# Patient Record
Sex: Male | Born: 1970 | Race: White | Hispanic: No | Marital: Single | Smoking: Never smoker
Health system: Southern US, Community
[De-identification: ages and names within clinical notes are randomized; demographics above are authoritative.]

## PROBLEM LIST (undated history)

## (undated) DIAGNOSIS — I4891 Unspecified atrial fibrillation: Secondary | ICD-10-CM

## (undated) DIAGNOSIS — K649 Unspecified hemorrhoids: Secondary | ICD-10-CM

## (undated) DIAGNOSIS — I872 Venous insufficiency (chronic) (peripheral): Secondary | ICD-10-CM

## (undated) DIAGNOSIS — K645 Perianal venous thrombosis: Secondary | ICD-10-CM

## (undated) DIAGNOSIS — I341 Nonrheumatic mitral (valve) prolapse: Secondary | ICD-10-CM

## (undated) DIAGNOSIS — F419 Anxiety disorder, unspecified: Secondary | ICD-10-CM

## (undated) HISTORY — PX: COLONOSCOPY: SHX174

## (undated) HISTORY — DX: Unspecified hemorrhoids: K64.9

## (undated) HISTORY — PX: OTHER SURGICAL HISTORY: SHX169

## (undated) HISTORY — DX: Nonrheumatic mitral (valve) prolapse: I34.1

## (undated) HISTORY — DX: Perianal venous thrombosis: K64.5

## (undated) HISTORY — DX: Venous insufficiency (chronic) (peripheral): I87.2

## (undated) HISTORY — DX: Anxiety disorder, unspecified: F41.9

## (undated) HISTORY — PX: MOUTH SURGERY: SHX715

---

## 2004-07-14 DIAGNOSIS — K649 Unspecified hemorrhoids: Secondary | ICD-10-CM

## 2004-07-14 HISTORY — DX: Unspecified hemorrhoids: K64.9

## 2005-02-25 ENCOUNTER — Ambulatory Visit: Payer: Self-pay | Admitting: Internal Medicine

## 2005-03-20 ENCOUNTER — Ambulatory Visit: Payer: Self-pay | Admitting: Internal Medicine

## 2005-03-26 ENCOUNTER — Ambulatory Visit: Payer: Self-pay | Admitting: Internal Medicine

## 2005-04-24 ENCOUNTER — Ambulatory Visit: Payer: Self-pay | Admitting: Internal Medicine

## 2007-07-27 ENCOUNTER — Encounter: Admission: RE | Admit: 2007-07-27 | Discharge: 2007-08-05 | Payer: Self-pay | Admitting: Family Medicine

## 2010-02-12 ENCOUNTER — Telehealth: Payer: Self-pay | Admitting: Internal Medicine

## 2010-02-15 ENCOUNTER — Encounter (INDEPENDENT_AMBULATORY_CARE_PROVIDER_SITE_OTHER): Payer: Self-pay | Admitting: *Deleted

## 2010-08-13 NOTE — Progress Notes (Signed)
Summary: ? next colonosopy   Phone Note From Other Clinic   Caller: [name], MD Summary of Call: asking when patient is due for routine colonoscopy (fam hx rectal cancer) sounds like 9/11 will follow-up and advise once chart reviewed Initial call taken by: Iva Boop MD, Clementeen Graham,  February 12, 2010 3:47 PM  Follow-up for Phone Call        recall in for Sept, chart is in your office on top of your recall stack.  Please advise Follow-up by: Darcey Nora RN, CGRN,  February 12, 2010 3:57 PM  Additional Follow-up for Phone Call Additional follow up Details #1::        It is time to repeat. You can call him to set up. chart on your desk Additional Follow-up by: Iva Boop MD, Clementeen Graham,  February 13, 2010 6:02 PM    Additional Follow-up for Phone Call Additional follow up Details #2::    Left message for patient to call back Darcey Nora RN, [facility]  February 14, 2010 8:20 AM  Left message for patient to call back Darcey Nora RN, [facility]  February 15, 2010 9:03 AM  No return call from the patient and I will be out of the office next week.  Recall colon letter will be sent to his home. Darcey Nora RN, [facility]  February 15, 2010 11:16 AM

## 2010-08-13 NOTE — Letter (Signed)
Summary: Colonoscopy Letter  [facility]  [address] [place], [place] [address]   Phone: [phone]  Fax: [phone]      February 15, 2010 MRN: [id number]   Charles Rasmussen [address] [place], [place]  [address]   Dear Charles Rasmussen,   According to your medical record, it is time for you to schedule a Colonoscopy. The American Cancer Society recommends this procedure as a method to detect early colon cancer. Patients with a family history of colon cancer, or a personal history of colon polyps or inflammatory bowel disease are at increased risk.  This letter has beeen generated based on the recommendations made at the time of your procedure. If you feel that in your particular situation this may no longer apply, please contact our office.  Please call our office at [phone] to schedule this appointment or to update your records at your earliest convenience.  Thank you for cooperating with [place] to provide you with the very best care possible.   Sincerely,   Iva Boop, M.D.  [facility] [phone]

## 2011-12-16 ENCOUNTER — Encounter: Payer: Self-pay | Admitting: Vascular Surgery

## 2011-12-29 ENCOUNTER — Encounter: Payer: Self-pay | Admitting: Vascular Surgery

## 2011-12-30 ENCOUNTER — Ambulatory Visit (INDEPENDENT_AMBULATORY_CARE_PROVIDER_SITE_OTHER): Payer: BC Managed Care – PPO | Admitting: *Deleted

## 2011-12-30 ENCOUNTER — Ambulatory Visit (INDEPENDENT_AMBULATORY_CARE_PROVIDER_SITE_OTHER): Payer: BC Managed Care – PPO | Admitting: Vascular Surgery

## 2011-12-30 ENCOUNTER — Encounter: Payer: Self-pay | Admitting: Vascular Surgery

## 2011-12-30 VITALS — BP 106/70 | HR 77 | Resp 16 | Ht 76.0 in | Wt 175.0 lb

## 2011-12-30 DIAGNOSIS — I83893 Varicose veins of bilateral lower extremities with other complications: Secondary | ICD-10-CM

## 2011-12-30 NOTE — Progress Notes (Signed)
Subjective:     Patient ID: Charles Rasmussen, male   DOB: 10-18-1970, 41 y.o.   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 He states this has been present for about 10 years and gradually enlarging. He has no history of DVT, thrombophlebitis, stasis ulcers, distal edema, or bleeding. He does remember bumping his leg on something in this area about 5-6 years ago. He was seen at a local vein Center who recommended closing his saphenous vein with a laser. He felt uncomfortable and came for another opinion.  Past Medical History  Diagnosis Date  . Mitral valve prolapse     History  Substance Use Topics  . Smoking status: Never Smoker   . Smokeless tobacco: Not on file  . Alcohol Use: No    History reviewed. No pertinent family history.  No Known Allergies  No current outpatient prescriptions on file.  BP 106/70  Pulse 77  Resp 16  Ht 6\' 4"  (1.93 m)  Wt 175 lb (79.379 kg)  BMI 21.30 kg/m2  Body mass index is 21.30 kg/(m^2).           Review of Systems denies chest pain, dyspnea on exertion, PND, orthopnea. Does have occasional palpitations due to mitral valve reflux all other systems negative and a complete review of systems     Objective:   Physical Exam blood pressure 106/70 heart rate 77 respirations 16 Gen.-alert and oriented x3 in no apparent distress HEENT normal for age Lungs no rhonchi or wheezing Cardiovascular regular rhythm no murmurs carotid pulses 3+ palpable no bruits audible Abdomen soft nontender no palpable masses Musculoskeletal free of  major deformities Skin clear -no rashes Neurologic normal Lower extremities 3+ femoral and dorsalis pedis pulses palpable bilaterally with no edema-there is a prominent nest of reticular veins in the right pretibial region which seemed to communicate with the great saphenous vein just below the knee. There are no other bulging varicosities.  There is no hyperpigmentation or ulceration. There is no distal edema.  Today I ordered a venous duplex exam of the right leg which are reviewed and interpreted. The right great saphenous vein is not of large caliber. Does have a segment in the distal thigh which has reflux. There is no DVT.      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 He will consider this

## 2012-01-02 DIAGNOSIS — I872 Venous insufficiency (chronic) (peripheral): Secondary | ICD-10-CM

## 2012-01-02 HISTORY — DX: Venous insufficiency (chronic) (peripheral): I87.2

## 2012-01-06 NOTE — Procedures (Unsigned)
LOWER EXTREMITY VENOUS REFLUX EXAM  INDICATION:  Varicose veins with pain.  EXAM:  Using color-flow imaging and pulse Doppler spectral analysis, the right common femoral, femoral, popliteal, posterior tibial, great and small saphenous veins were evaluated.  There is evidence suggesting deep venous reflux of >500 milliseconds noted in the right common femoral and superficial femoral veins.  The right saphenofemoral junction is competent.  The right nontortuous great saphenous vein demonstrates reflux of >500 milliseconds with calibers as described below.   The right proximal small saphenous vein demonstrates competency.  GSV Diameter (used if found to be incompetent only)                                           Right    Left Proximal Greater Saphenous Vein           0.35 cm  cm Proximal-to-mid-thigh                     cm       cm Mid thigh                                 0.36 cm  cm Mid-distal thigh                          cm       cm Distal thigh                              0.48 cm  cm Knee                                      0.45 cm  cm  IMPRESSION: 1. Right great saphenous vein reflux as noted, as described above. 2. Right deep venous reflux as noted, as described above.  ___________________________________________ [name]. Hart Rochester, M.D.  CH/MEDQ  D:  01/02/2012  T:  01/02/2012  Job:  [id number]

## 2012-04-09 ENCOUNTER — Encounter: Payer: Self-pay | Admitting: Internal Medicine

## 2012-09-14 ENCOUNTER — Encounter: Payer: Self-pay | Admitting: *Deleted

## 2012-09-15 ENCOUNTER — Ambulatory Visit (INDEPENDENT_AMBULATORY_CARE_PROVIDER_SITE_OTHER): Payer: BC Managed Care – PPO | Admitting: *Deleted

## 2012-09-15 ENCOUNTER — Encounter: Payer: Self-pay | Admitting: Vascular Surgery

## 2012-09-15 DIAGNOSIS — I781 Nevus, non-neoplastic: Secondary | ICD-10-CM

## 2012-09-15 DIAGNOSIS — I83893 Varicose veins of bilateral lower extremities with other complications: Secondary | ICD-10-CM

## 2012-09-15 NOTE — Progress Notes (Signed)
X=...4% Sotradecol administered with a 27g butterfly.  Patient received a total of 6cc.  Bundle of VV's on top of right chin. Easy access. Tol well. Anticipate good results. Has reflux in R GSV-discussed this. Diameter too small for laser ablation at this time. Follow prn.   Photos: yes  Compression stockings applied: yes and ace

## 2012-09-27 ENCOUNTER — Other Ambulatory Visit: Payer: Self-pay | Admitting: Family Medicine

## 2012-09-28 LAB — URINE CULTURE: Organism ID, Bacteria: NO GROWTH

## 2012-10-27 ENCOUNTER — Encounter: Payer: Self-pay | Admitting: *Deleted

## 2012-10-28 ENCOUNTER — Encounter: Payer: Self-pay | Admitting: Family Medicine

## 2012-10-28 ENCOUNTER — Ambulatory Visit (INDEPENDENT_AMBULATORY_CARE_PROVIDER_SITE_OTHER): Payer: BC Managed Care – PPO | Admitting: Family Medicine

## 2012-10-28 VITALS — BP 105/68 | HR 65 | Temp 97.5°F | Ht 76.0 in | Wt 188.0 lb

## 2012-10-28 DIAGNOSIS — N419 Inflammatory disease of prostate, unspecified: Secondary | ICD-10-CM

## 2012-10-28 DIAGNOSIS — N390 Urinary tract infection, site not specified: Secondary | ICD-10-CM

## 2012-10-28 DIAGNOSIS — I341 Nonrheumatic mitral (valve) prolapse: Secondary | ICD-10-CM

## 2012-10-28 DIAGNOSIS — I059 Rheumatic mitral valve disease, unspecified: Secondary | ICD-10-CM

## 2012-10-28 DIAGNOSIS — E785 Hyperlipidemia, unspecified: Secondary | ICD-10-CM | POA: Insufficient documentation

## 2012-10-28 DIAGNOSIS — N411 Chronic prostatitis: Secondary | ICD-10-CM | POA: Insufficient documentation

## 2012-10-28 DIAGNOSIS — L919 Hypertrophic disorder of the skin, unspecified: Secondary | ICD-10-CM

## 2012-10-28 DIAGNOSIS — L918 Other hypertrophic disorders of the skin: Secondary | ICD-10-CM

## 2012-10-28 LAB — POCT URINALYSIS DIPSTICK
Bilirubin, UA: NEGATIVE
Blood, UA: NEGATIVE
Glucose, UA: NEGATIVE
Ketones, UA: NEGATIVE
Leukocytes, UA: NEGATIVE
Nitrite, UA: NEGATIVE
pH, UA: 6.5

## 2012-10-28 LAB — POCT UA - MICROSCOPIC ONLY
Bacteria, U Microscopic: NEGATIVE
Mucus, UA: NEGATIVE
RBC, urine, microscopic: NEGATIVE

## 2012-10-28 LAB — POCT WET PREP (WET MOUNT)

## 2012-10-28 NOTE — Progress Notes (Signed)
  Subjective:    Patient ID: Charles Rasmussen, male    DOB: [birth year], 42 y.o.   MRN: [id number]  HPI Nazim returns today for followup of his chronic prostatitis. Just finished course of doxycycline for 4 weeks. In addition he has an irritated skin tag of the left medial buttock that he wants addressed today.  Review of Systems  Musculoskeletal: Positive for back pain (continued LBP).       Objective:   Physical Exam  Vitals reviewed. Constitutional: He is oriented to person, place, and time. He appears well-developed and well-nourished.  Abdominal: Soft. Bowel sounds are normal. He exhibits no distension and no mass. Tenderness: slight suprapubic. There is no rebound and no guarding.  Genitourinary: Penis normal. No penile tenderness.  Prostate slightly enlarged without lumps but tender to palpation. Rectal masses. No hernia palpated. No discharge. Irritated skin tag left medial buttock  Neurological: He is alert and oriented to person, place, and time. He has normal reflexes.  Skin: Skin is warm and dry. No rash noted. No erythema. No pallor.  Left medial buttock skin tag, irritated  Psychiatric: He has a normal mood and affect. His behavior is normal. Judgment and thought content normal.      Results for orders placed in visit on 10/28/12  POCT URINALYSIS DIPSTICK      Result Value Range   Color, UA yellow     Clarity, UA clear     Glucose, UA negative     Bilirubin, UA negative     Ketones, UA negative     Spec Grav, UA 1.015     Blood, UA negative     pH, UA 6.5     Protein, UA negative     Urobilinogen, UA negative     Nitrite, UA negative     Leukocytes, UA Negative    POCT UA - MICROSCOPIC ONLY      Result Value Range   WBC, Ur, HPF, POC neg     RBC, urine, microscopic neg     Bacteria, U Microscopic neg     Mucus, UA neg     Epithelial cells, urine per micros neg     Crystals, Ur, HPF, POC neg     Casts, Ur, LPF, POC neg     Yeast, UA neg    POCT WET PREP (WET  MOUNT)      Result Value Range   Source Wet Prep POC prostate     WBC, Wet Prep HPF POC 5-10     Bacteria Wet Prep HPF POC occ      Assessment & Plan:  1. UTI (urinary tract infection) - POCT urinalysis dipstick - POCT UA - Microscopic Only  2. Chronic prostatitis  3. Hyperlipidemia   4. MVP (mitral valve prolapse) Appointment to see Dr. Shirlee Latch  5. Prostate infection - POCT Wet Prep [facility]) Continue antibiotic for 2 more weeks then as needed  6. Skin tag Cryotherapy to skin tag  Per.mission by patient to do cryotherapy on this.  Was done successfully without problems

## 2012-10-28 NOTE — Patient Instructions (Signed)
Push fluids Try some over-the-counter anti-inflammatory medicine i.e. Aleve one twice daily after breakfast and supper for right side and low back pain Take antibiotic for a couple of more weeks, then hold the rest

## 2012-11-09 ENCOUNTER — Institutional Professional Consult (permissible substitution): Payer: BC Managed Care – PPO | Admitting: Cardiology

## 2012-12-20 ENCOUNTER — Telehealth: Payer: Self-pay | Admitting: Internal Medicine

## 2012-12-20 NOTE — Telephone Encounter (Signed)
Patient c/o external hemorrhoids.  He will come in and see Willette Cluster RNP tomorrow at 9:30 am

## 2012-12-21 ENCOUNTER — Encounter: Payer: Self-pay | Admitting: *Deleted

## 2012-12-21 ENCOUNTER — Encounter (INDEPENDENT_AMBULATORY_CARE_PROVIDER_SITE_OTHER): Payer: Self-pay | Admitting: Surgery

## 2012-12-21 ENCOUNTER — Ambulatory Visit (INDEPENDENT_AMBULATORY_CARE_PROVIDER_SITE_OTHER): Payer: BC Managed Care – PPO | Admitting: Nurse Practitioner

## 2012-12-21 ENCOUNTER — Ambulatory Visit (INDEPENDENT_AMBULATORY_CARE_PROVIDER_SITE_OTHER): Payer: BC Managed Care – PPO | Admitting: Surgery

## 2012-12-21 VITALS — BP 110/60 | HR 60 | Ht 76.0 in | Wt 183.8 lb

## 2012-12-21 VITALS — BP 118/82 | HR 76 | Temp 97.3°F | Resp 14 | Ht 76.0 in | Wt 184.4 lb

## 2012-12-21 DIAGNOSIS — K645 Perianal venous thrombosis: Secondary | ICD-10-CM

## 2012-12-21 MED ORDER — HYDROCODONE-ACETAMINOPHEN 5-325 MG PO TABS: 1.0000 | ORAL_TABLET | ORAL | Status: DC | PRN

## 2012-12-21 NOTE — Patient Instructions (Addendum)
Go to [facility] [address] [place]. Phone # [phone]. You will see Dr. Cyndia Bent. Arrive at 3:15 for a 3:30 PM .    We have given you samples of Recticare cream, apply to affected area.

## 2012-12-21 NOTE — Progress Notes (Signed)
HPI :   Patient is a 42 year old male known to Dr. Leone Payor. He had a colonoscopy September 2006 for abdominal pain, bloating, rectal bleeding and a family history of colon cancer in a parent. Findings included internal and external hemorrhoids, felt to be the source of bleeding. No polyps or cancer seen.  Fish or done for evaluation of hemorrhoids. History slightly working out at the [employer], that evening noticed a large hemorrhoid. Patient tried to reduce the hemorrhoid but was unable to. He spent in a lot of discomfort over the last couple of days. Hemorrhoid now with some mild bleeding. Patient has not been constipated.   Past Medical History  Diagnosis Date  . Mitral valve prolapse   . Venous reflux 01/02/12    deep right per [name]. Hart Rochester, M.D.  . Unspecified hemorrhoids without mention of complication 2006    Family History  Problem Relation Age of Onset  . Colon cancer Father 64  . Colon cancer Maternal Grandfather 24   History  Substance Use Topics  . Smoking status: Never Smoker   . Smokeless tobacco: Never Used  . Alcohol Use: No   Current Outpatient Prescriptions  Medication Sig Dispense Refill  . doxycycline (VIBRA-TABS) 100 MG tablet        No current facility-administered medications for this visit.   No Known Allergies  Review of Systems: All systems reviewed and negative except where noted in HPI.   Physical Exam: BP 110/60  Pulse 60  Ht 6\' 4"  (1.93 m)  Wt 183 lb 12.8 oz (83.371 kg)  BMI 22.38 kg/m2 Constitutional: Peasant,well-developed, white male in no acute distress. HEENT: Normocephalic and atraumatic. Conjunctivae are normal. No scleral icterus. Heterochromia Abdominal: Soft, nondistended, nontender. Bowel sounds active throughout. There are no masses palpable. No hepatomegaly. Rectal: Large tender thrombosed external hemorrhoid Extremities: no edema Psychiatric: Normal mood and affect. Behavior is normal.  ASSESSMENT AND PLAN: 42. 42 year old male  with very large to painful, thrombosed external hemorrhoid. Not sure this will resolve with conservative management. Patient having significant discomfort. Samples of Lidocaine ( Recticare ) given. Referred to [facility] today at 3pm.    2. Chronic prostate infection, takes Doxycycline. Advised patient to take with full glass of fluid and not lay down for at least 2 hours after taking medication as Doxycycline can cause esophageal ulcers.    Addendum: Reviewed and agree with initial management. Beverley Fiedler, MD

## 2012-12-21 NOTE — Patient Instructions (Signed)
Hemorrhoids Hemorrhoids are swollen veins around the rectum or anus. There are two types of hemorrhoids:   Internal hemorrhoids. These occur in the veins just inside the rectum. They may poke through to the outside and become irritated and painful.  External hemorrhoids. These occur in the veins outside the anus and can be felt as a painful swelling or hard lump near the anus. CAUSES  Pregnancy.   Obesity.   Constipation or diarrhea.   Straining to have a bowel movement.   Sitting for long periods on the toilet.  Heavy lifting or other activity that caused you to strain.  Anal intercourse. SYMPTOMS   Pain.   Anal itching or irritation.   Rectal bleeding.   Fecal leakage.   Anal swelling.   One or more lumps around the anus.  DIAGNOSIS  Your caregiver may be able to diagnose hemorrhoids by visual examination. Other examinations or tests that may be performed include:   Examination of the rectal area with a gloved hand (digital rectal exam).   Examination of anal canal using a small tube (scope).   A blood test if you have lost a significant amount of blood.  A test to look inside the colon (sigmoidoscopy or colonoscopy). TREATMENT Most hemorrhoids can be treated at home. However, if symptoms do not seem to be getting better or if you have a lot of rectal bleeding, your caregiver may perform a procedure to help make the hemorrhoids get smaller or remove them completely. Possible treatments include:   Placing a rubber band at the base of the hemorrhoid to cut off the circulation (rubber band ligation).   Injecting a chemical to shrink the hemorrhoid (sclerotherapy).   Using a tool to burn the hemorrhoid (infrared light therapy).   Surgically removing the hemorrhoid (hemorrhoidectomy).   Stapling the hemorrhoid to block blood flow to the tissue (hemorrhoid stapling).  HOME CARE INSTRUCTIONS   Eat foods with fiber, such as whole grains, beans,  nuts, fruits, and vegetables. Ask your doctor about taking products with added fiber in them (fibersupplements).  Increase fluid intake. Drink enough water and fluids to keep your urine clear or pale yellow.   Exercise regularly.   Go to the bathroom when you have the urge to have a bowel movement. Do not wait.   Avoid straining to have bowel movements.   Keep the anal area dry and clean. Use wet toilet paper or moist towelettes after a bowel movement.   Medicated creams and suppositories may be used or applied as directed.   Only take over-the-counter or prescription medicines as directed by your caregiver.   Take warm sitz baths for 15 20 minutes, 3 4 times a day to ease pain and discomfort.   Place ice packs on the hemorrhoids if they are tender and swollen. Using ice packs between sitz baths may be helpful.   Put ice in a plastic bag.   Place a towel between your skin and the bag.   Leave the ice on for 15 20 minutes, 3 4 times a day.   Do not use a donut-shaped pillow or sit on the toilet for long periods. This increases blood pooling and pain.  SEEK MEDICAL CARE IF:  You have increasing pain and swelling that is not controlled by treatment or medicine.  You have uncontrolled bleeding.  You have difficulty or you are unable to have a bowel movement.  You have pain or inflammation outside the area of the hemorrhoids. MAKE SURE YOU:    Understand these instructions.  Will watch your condition.  Will get help right away if you are not doing well or get worse. Document Released: 06/27/2000 Document Revised: 06/16/2012 Document Reviewed: 05/04/2012 Exit[employer] Patient Information 2014 [employer].  

## 2012-12-21 NOTE — Progress Notes (Signed)
NAME: Charles Rasmussen       DOB: 07/19/1970           DATE: 12/21/2012       ZOX:[id number]  CC:  Chief Complaint  Patient presents with  . Hemorrhoids    throm hems    HPI: This patient is referred over for recent development of thrombosed external hemorrhoids. He was seen in the gastroenterology office earlier today. He developed painful hemorrhoids about 5 days ago and it got even worse over the last 2 days. Gastroenterologist felt that surgical drainage might be more appropriate than just local medical care. He is not had hemorrhoids previously. He thinks he came after doing some exercises.  EXAM: Vital signs: BP 118/82  Pulse 76  Temp(Src) 97.3 F (36.3 C) (Temporal)  Resp 14  Ht 6\' 4"  (1.93 m)  Wt 184 lb 6.4 oz (83.643 kg)  BMI 22.46 kg/m2  General: Patient alert, oriented, NAD  Rectal exam: He has thrombosed and somewhat prolapsed right external hemorrhoid that shows a little clot extruding. It involves about 120 of the circumference of the anus. There is a very small thrombosed hemorrhoid in the left anterior aspect. These are exquisitely tender. There is no evidence of gangrenous changes. IMP: Thrombosed external hemorrhoids  PLAN: I discussed putting in some local and trying to extrude some clot there is some immediate pain relief. We also talked about local therapy and followup. He may need to the operative this improve over the next 48-72 hour  PROCEDURE NOTE I discussed doing an I&D of the thrombosed hemorrhoid to the patient he was agreeable. Area was anesthetized with a combination of 1% Xylocaine plus epinephrine and 0.25% plain Marcaine with epinephrine. A total of 10 cc was utilized. I waited 10 minutes For good hemostasis effect.   I then made some elliptical incisions over the most prominent areas of clot and removed clot from all the areas such that most of the swelling was gone and no obvious residual clot was remaining. A Vaseline gauze dressing was applied.  The  procedure was less than tolerated. He'll begin sitz baths in the morning. I may give him some Norco for pain. He'll followup with [place] in about 2 weeks, sooner if he has any worsening of his symptoms or other problems. Cloey Sferrazza J 12/21/2012

## 2012-12-27 ENCOUNTER — Telehealth: Payer: Self-pay | Admitting: Nurse Practitioner

## 2012-12-27 NOTE — Telephone Encounter (Signed)
Spoke with patient and instructed him to call Dr. Jamey Ripa to let him know he is still having problems since he did surgery on him. Patient will do this.

## 2012-12-28 ENCOUNTER — Ambulatory Visit (INDEPENDENT_AMBULATORY_CARE_PROVIDER_SITE_OTHER): Payer: BC Managed Care – PPO | Admitting: General Surgery

## 2012-12-28 ENCOUNTER — Encounter (INDEPENDENT_AMBULATORY_CARE_PROVIDER_SITE_OTHER): Payer: Self-pay | Admitting: General Surgery

## 2012-12-28 VITALS — BP 110/72 | HR 80 | Temp 97.8°F | Resp 15 | Ht 76.0 in | Wt 183.6 lb

## 2012-12-28 DIAGNOSIS — K645 Perianal venous thrombosis: Secondary | ICD-10-CM

## 2012-12-28 MED ORDER — HYDROCORTISONE 2.5 % RE CREA: TOPICAL_CREAM | Freq: Two times a day (BID) | RECTAL | Status: DC

## 2012-12-28 NOTE — Patient Instructions (Addendum)

## 2012-12-28 NOTE — Progress Notes (Signed)
Chief Complaint  Patient presents with  . Routine Post Op    1st po reck hems    HISTORY: Charles Rasmussen is a 42 y.o. male who presents to the office with rectal pain.  Other symptoms include min bleeding on toilet paper.  This had been occurring for about a week.  He denies bleeding with BM's otherwise.  He has tried sitz in the past with some success.  Nothing makes the symptoms worse.   It is continuous in nature.  His bowel habits are regular and his bowel movements are soft.  His fiber intake is moderate.       Past Medical History  Diagnosis Date  . Mitral valve prolapse   . Venous reflux 01/02/12    deep right per [name]. Hart Rochester, M.D.  . Unspecified hemorrhoids without mention of complication 2006      Past Surgical History  Procedure Laterality Date  . Neg hx    . Mouth surgery      wisdom teeth        Current Outpatient Prescriptions  Medication Sig Dispense Refill  . doxycycline (VIBRA-TABS) 100 MG tablet        No current facility-administered medications for this visit.      No Known Allergies    Family History  Problem Relation Age of Onset  . Colon cancer Father 71  . Cancer Father     rectal  . Colon cancer Maternal Grandfather 40  . Cancer Paternal Grandfather     rectal    History   Social History  . Marital Status: Single    Spouse Name: N/A    Number of Children: 0  . Years of Education: N/A   Occupational History  .     Social History Main Topics  . Smoking status: Never Smoker   . Smokeless tobacco: Never Used  . Alcohol Use: No  . Drug Use: No  . Sexually Active: None   Other Topics Concern  . None   Social History Narrative  . None      REVIEW OF SYSTEMS - PERTINENT POSITIVES ONLY: Review of Systems - General ROS: negative for - chills, fever or weight loss Hematological and Lymphatic ROS: negative for - bleeding problems, blood clots or bruising Respiratory ROS: no cough, shortness of breath, or wheezing Cardiovascular ROS:  no chest pain or dyspnea on exertion Gastrointestinal ROS: no abdominal pain, change in bowel habits, or black or bloody stools Genito-Urinary ROS: no dysuria, trouble voiding, or hematuria  EXAM: Filed Vitals:   12/28/12 1022  BP: 110/72  Pulse: 80  Temp: 97.8 F (36.6 C)  Resp: 15    General appearance: alert and cooperative Resp: clear to auscultation bilaterally Cardio: regular rate and rhythm GI: soft, non-tender; bowel sounds normal; no masses,  no organomegaly   Anal exam findings: inflamed external hemorrhoid, no palpable thrombosis    ASSESSMENT AND PLAN: Charles Rasmussen is a 42 y.o. M here for f/u regarding his thrombosed external hemorrhoid.  He seems to be recovering slowly.  I have advised him to continue sitz baths and eat a high fiber diet as well as avoid straining for the next few weeks.  I have given him a prescription for anusol.  I will see him back if his symptoms persist.      [name], MD Colon and Rectal Surgery / [facility]      Visit Diagnoses: 1. External hemorrhoid, thrombosed  Primary Care Physician: [name], MD

## 2013-01-07 ENCOUNTER — Telehealth (INDEPENDENT_AMBULATORY_CARE_PROVIDER_SITE_OTHER): Payer: Self-pay

## 2013-01-07 NOTE — Telephone Encounter (Signed)
I left the patient a message to call me.  He has an appointment Tuesday to see Dr Maisie Fus and I want to see if he plans to keep it.

## 2013-01-07 NOTE — Telephone Encounter (Signed)
Patient hems is doing better cancelled appt with Dr. Maisie Fus for 01/11/13

## 2013-01-11 ENCOUNTER — Encounter (INDEPENDENT_AMBULATORY_CARE_PROVIDER_SITE_OTHER): Payer: BC Managed Care – PPO | Admitting: General Surgery

## 2013-02-09 ENCOUNTER — Institutional Professional Consult (permissible substitution): Payer: BC Managed Care – PPO | Admitting: Cardiology

## 2013-11-02 ENCOUNTER — Other Ambulatory Visit: Payer: BC Managed Care – PPO

## 2013-11-02 NOTE — Progress Notes (Signed)
Patient dropped off fobt 

## 2013-11-03 LAB — FECAL OCCULT BLOOD, IMMUNOCHEMICAL: FECAL OCCULT BLOOD: NEGATIVE

## 2013-11-07 ENCOUNTER — Encounter: Payer: Self-pay | Admitting: Family Medicine

## 2013-11-07 ENCOUNTER — Ambulatory Visit (INDEPENDENT_AMBULATORY_CARE_PROVIDER_SITE_OTHER): Payer: BC Managed Care – PPO | Admitting: Family Medicine

## 2013-11-07 ENCOUNTER — Ambulatory Visit (INDEPENDENT_AMBULATORY_CARE_PROVIDER_SITE_OTHER): Payer: BC Managed Care – PPO

## 2013-11-07 VITALS — BP 117/63 | HR 66 | Temp 97.5°F | Ht 76.0 in | Wt 184.0 lb

## 2013-11-07 DIAGNOSIS — N411 Chronic prostatitis: Secondary | ICD-10-CM

## 2013-11-07 DIAGNOSIS — Z0000 Encounter for general adult medical examination without abnormal findings: Secondary | ICD-10-CM

## 2013-11-07 DIAGNOSIS — E785 Hyperlipidemia, unspecified: Secondary | ICD-10-CM

## 2013-11-07 DIAGNOSIS — N400 Enlarged prostate without lower urinary tract symptoms: Secondary | ICD-10-CM

## 2013-11-07 DIAGNOSIS — L918 Other hypertrophic disorders of the skin: Secondary | ICD-10-CM

## 2013-11-07 DIAGNOSIS — J309 Allergic rhinitis, unspecified: Secondary | ICD-10-CM

## 2013-11-07 DIAGNOSIS — I341 Nonrheumatic mitral (valve) prolapse: Secondary | ICD-10-CM

## 2013-11-07 DIAGNOSIS — Q676 Pectus excavatum: Secondary | ICD-10-CM | POA: Insufficient documentation

## 2013-11-07 LAB — POCT URINALYSIS DIPSTICK
BILIRUBIN UA: NEGATIVE
Blood, UA: NEGATIVE
GLUCOSE UA: NEGATIVE
Ketones, UA: NEGATIVE
Leukocytes, UA: NEGATIVE
Nitrite, UA: NEGATIVE
Protein, UA: NEGATIVE
Spec Grav, UA: 1.015
Urobilinogen, UA: NEGATIVE
pH, UA: 6

## 2013-11-07 LAB — POCT UA - MICROSCOPIC ONLY
BACTERIA, U MICROSCOPIC: NEGATIVE
Casts, Ur, LPF, POC: NEGATIVE
Crystals, Ur, HPF, POC: NEGATIVE
Epithelial cells, urine per micros: NEGATIVE
Mucus, UA: NEGATIVE
RBC, URINE, MICROSCOPIC: NEGATIVE
WBC, Ur, HPF, POC: NEGATIVE
Yeast, UA: NEGATIVE

## 2013-11-07 MED ORDER — DOXYCYCLINE HYCLATE 100 MG PO TABS: 100.0000 mg | ORAL_TABLET | Freq: Two times a day (BID) | ORAL | Status: DC

## 2013-11-07 NOTE — Progress Notes (Addendum)
Subjective:    Patient ID: Charles Rasmussen, male    DOB: 1971/06/16, 43 y.o.   MRN: [id number]  HPI Patient is here today for annual wellness exam and follow up of chronic medical problems. The patient is doing well with minimal complaints. He still has his ongoing issues with his chronic prostatitis. He has to take periodic rounds of antibiotic for this. He also has a history of mitral valve prolapse and palpitations. He currently has some left flank pressure. He had lab work done at his work and these will be reviewed with him today.          Patient Active Problem List   Diagnosis Date Noted  . Thrombosed external hemorrhoid 12/21/2012  . Chronic prostatitis 10/28/2012  . Hyperlipidemia 10/28/2012  . MVP (mitral valve prolapse) 10/28/2012  . Varicose veins of lower extremities with other complications 12/30/2011   Outpatient Encounter Prescriptions as of 11/07/2013  Medication Sig  . [DISCONTINUED] doxycycline (VIBRA-TABS) 100 MG tablet   . [DISCONTINUED] hydrocortisone (ANUSOL-HC) 2.5 % rectal cream Place rectally 2 (two) times daily. Apply around anus for irritated & painful hemorrhoids    Review of Systems  Constitutional: Negative.   HENT: Negative.   Eyes: Negative.   Respiratory: Negative.   Cardiovascular: Negative.   Gastrointestinal: Negative.   Endocrine: Negative.   Genitourinary: Negative.        Flank pressure  Musculoskeletal: Negative.   Skin: Negative.   Allergic/Immunologic: Negative.   Neurological: Negative.   Hematological: Negative.   Psychiatric/Behavioral: Negative.        Objective:   Physical Exam  Nursing note and vitals reviewed. Constitutional: He is oriented to person, place, and time. He appears well-developed and well-nourished. No distress.  HENT:  Head: Normocephalic and atraumatic.  Right Ear: External ear normal.  Left Ear: External ear normal.  Mouth/Throat: Oropharynx is clear and moist. No oropharyngeal exudate.  Nasal  congestion bilateral  Eyes: Conjunctivae and EOM are normal. Pupils are equal, round, and reactive to light. Right eye exhibits no discharge. Left eye exhibits no discharge. No scleral icterus.  Neck: Normal range of motion. Neck supple. No thyromegaly present.  No carotid bruits  Cardiovascular: Normal rate, regular rhythm, normal heart sounds and intact distal pulses.  Exam reveals no gallop and no friction rub.   No murmur heard. At 84 per minute. The rhythm was mostly regular with one or 2 skips auscultated. There were no murmurs  Pulmonary/Chest: Effort normal and breath sounds normal. No respiratory distress. He has no wheezes. He has no rales. He exhibits no tenderness.  Abdominal: Soft. Bowel sounds are normal. He exhibits no mass. There is no tenderness. There is no rebound and no guarding.  Genitourinary: Rectum normal and penis normal.  The prostate was slightly enlarged but smooth there were no rectal masses. There were no hernias palpated and no inguinal nodes. The external genitalia were within normal limits.  Musculoskeletal: Normal range of motion. He exhibits no edema and no tenderness.  The patient does have a pectus excavatum  Lymphadenopathy:    He has no cervical adenopathy.  Neurological: He is alert and oriented to person, place, and time. He has normal reflexes. No cranial nerve deficit.  Skin: Skin is warm and dry. No rash noted. No erythema. No pallor.  Cryotherapy was done to an irritated skin tag of the left medial buttock  Psychiatric: He has a normal mood and affect. His behavior is normal. Judgment and thought content normal.  BP 117/63  Pulse 66  Temp(Src) 97.5 F (36.4 C) (Oral)  Ht 6\' 4"  (1.93 m)  Wt 184 lb (83.462 kg)  BMI 22.41 kg/m2  [facility] reading (PRIMARY) by  Dr. Tracie HarrierMoore-chest x-ray-                                 Results for orders placed in visit on 11/07/13  POCT URINALYSIS DIPSTICK      Result Value Ref Range   Color, UA gold     Clarity, UA  clear     Glucose, UA neg     Bilirubin, UA neg     Ketones, UA neg     Spec Grav, UA 1.015     Blood, UA neg     pH, UA 6.0     Protein, UA neg     Urobilinogen, UA negative     Nitrite, UA neg     Leukocytes, UA Negative    POCT UA - MICROSCOPIC ONLY      Result Value Ref Range   WBC, Ur, HPF, POC neg     RBC, urine, microscopic neg     Bacteria, U Microscopic neg     Mucus, UA neg     Epithelial cells, urine per micros neg     Crystals, Ur, HPF, POC neg     Casts, Ur, LPF, POC neg     Yeast, UA neg     The urine wet prep after the prostate exam-5-8 RBCs and occasional WBC--no treatment necessary [facility] reading (PRIMARY) by  Dr.Frimet Durfee-chest -no active disease                                      Assessment & Plan:  1. Prostate enlargement - POCT urinalysis dipstick - POCT UA - Microscopic Only  2. Hyperlipidemia - DG Chest 2 View; Future  3. Chronic prostatitis  4. Annual physical exam - DG Chest 2 View; Future  5. Mitral valve prolapse - Ambulatory referral to Cardiology  6. Allergic rhinitis  7. Skin tag -Cryotherapy  8. Pectus excavatum  Patient Instructions  Continue aggressive therapeutic lifestyle changes which include diet and exercise Recheck advanced lipid test in 6 month Continue to drink plenty of fluid A prescription for an antibiotic has been called in for the prostatitis to have filled as needed The cryotherapy on the skin tag may form a blister and it should fall off at that time. Keep the area cleaned with alcohol   Nyra Capeson W. Noland Pizano MD

## 2013-11-07 NOTE — Patient Instructions (Signed)
Continue aggressive therapeutic lifestyle changes which include diet and exercise Recheck advanced lipid test in 6 month Continue to drink plenty of fluid A prescription for an antibiotic has been called in for the prostatitis to have filled as needed The cryotherapy on the skin tag may form a blister and it should fall off at that time. Keep the area cleaned with alcohol

## 2013-11-15 ENCOUNTER — Ambulatory Visit: Payer: BC Managed Care – PPO | Admitting: Cardiology

## 2014-02-03 ENCOUNTER — Ambulatory Visit (INDEPENDENT_AMBULATORY_CARE_PROVIDER_SITE_OTHER): Payer: BC Managed Care – PPO | Admitting: Family Medicine

## 2014-02-03 ENCOUNTER — Encounter: Payer: Self-pay | Admitting: Family Medicine

## 2014-02-03 VITALS — BP 119/71 | HR 86 | Temp 98.3°F | Ht 76.0 in | Wt 181.4 lb

## 2014-02-03 DIAGNOSIS — N476 Balanoposthitis: Secondary | ICD-10-CM

## 2014-02-03 DIAGNOSIS — N481 Balanitis: Secondary | ICD-10-CM

## 2014-02-03 MED ORDER — NYSTATIN 100000 UNIT/GM EX CREA: 1.0000 "application " | TOPICAL_CREAM | Freq: Two times a day (BID) | CUTANEOUS | Status: DC

## 2014-02-03 MED ORDER — FLUCONAZOLE 150 MG PO TABS: 150.0000 mg | ORAL_TABLET | Freq: Once | ORAL | Status: DC

## 2014-02-03 NOTE — Progress Notes (Signed)
   Subjective:    Patient ID: Merrik Vedia PereyraM Eckart, male    DOB: [birth year], 43 y.o.   MRN: [id number]  HPI This 43 y.o. male presents for evaluation of rash on private area.   Review of Systems No chest pain, SOB, HA, dizziness, vision change, N/V, diarrhea, constipation, dysuria, urinary urgency or frequency, myalgias, arthralgias or rash.     Objective:   Physical Exam  Vital signs noted  Well developed well nourished male.  HEENT - Head atraumatic Normocephalic Respiratory - Lungs CTA bilateral Cardiac - RRR S1 and S2 without murmur GU - Glans penis with erythema and mild swelling     Assessment & Plan:  Balanitis - Plan: nystatin cream (MYCOSTATIN), fluconazole (DIFLUCAN) 150 MG tablet Deatra CanterWilliam J Verble Styron FNP

## 2014-02-08 ENCOUNTER — Encounter: Payer: Self-pay | Admitting: *Deleted

## 2014-02-17 ENCOUNTER — Encounter: Payer: Self-pay | Admitting: Cardiology

## 2014-02-17 ENCOUNTER — Ambulatory Visit (INDEPENDENT_AMBULATORY_CARE_PROVIDER_SITE_OTHER): Payer: BC Managed Care – PPO | Admitting: Cardiology

## 2014-02-17 VITALS — BP 118/58 | HR 65 | Ht 76.0 in | Wt 183.0 lb

## 2014-02-17 DIAGNOSIS — I341 Nonrheumatic mitral (valve) prolapse: Secondary | ICD-10-CM

## 2014-02-17 DIAGNOSIS — I059 Rheumatic mitral valve disease, unspecified: Secondary | ICD-10-CM

## 2014-02-17 DIAGNOSIS — I493 Ventricular premature depolarization: Secondary | ICD-10-CM

## 2014-02-17 DIAGNOSIS — I4949 Other premature depolarization: Secondary | ICD-10-CM

## 2014-02-17 NOTE — Patient Instructions (Signed)
The current medical regimen is effective;  continue present plan and medications.  Your physician has requested that you have an echocardiogram. Echocardiography is a painless test that uses sound waves to create images of your heart. It provides your doctor with information about the size and shape of your heart and how well your heart's chambers and valves are working. This procedure takes approximately one hour. There are no restrictions for this procedure.  Follow up in 2 years with Dr Shirlee LatchMcLean.  You will receive a letter in the mail 2 months before you are due.  Please call us when you receive this letter to schedule your follow up appointment.

## 2014-02-19 DIAGNOSIS — I493 Ventricular premature depolarization: Secondary | ICD-10-CM | POA: Insufficient documentation

## 2014-02-19 NOTE — Progress Notes (Signed)
Patient ID: Charles Rasmussen, male   DOB: May 04, 1971, 43 y.o.   MRN: [id number] PCP: Dr. Christell ConstantMoore  43 yo with history of PVCs and possible mitral valve prolapse presents for cardiology evaluation.  Patient says that mitral valve prolapse was diagnosed in 11th grade. He also has PVCs that seem to flare when he is stressed.  No exertional dyspnea.  No chest pain.  No lightheadedness.  He walks for exercise and lifts weights. Currently having only rare palpitations.  He has not had an echocardiogram in a number of years and there is none in our system.   ECG: NSR, right axis deviation  PMH: 1. Chronic prostatitis 2. Mitral valve prolapse 3. PVCs 4. Varicose veins  SH: Lives in [place], works in school system, nonsmoker.    FH: Mother with MVP, brother with some form of congenital heart disease s/p valve surgery, grandfather with MVP.    ROS: All systems reviewed and negative except as per HPI.   No medications  BP 118/58  Pulse 65  Ht 6\' 4"  (1.93 m)  Wt 183 lb (83.008 kg)  BMI 22.28 kg/m2 General: NAD Neck: No JVD, no thyromegaly or thyroid nodule.  Lungs: Clear to auscultation bilaterally with normal respiratory effort. CV: Nondisplaced PMI.  Heart regular S1/S2, no S3/S4, no murmur.  No peripheral edema.  No carotid bruit.  Normal pedal pulses.  Abdomen: Soft, nontender, no hepatosplenomegaly, no distention.  Skin: Intact without lesions or rashes.  Neurologic: Alert and oriented x 3.  Psych: Normal affect. Extremities: No clubbing or cyanosis.  HEENT: Normal.  MSK: Pectus excavatum (mild)  Assessment/Plan: 1. PVCs: Very rare palpitations, mainly flare when he's under stress.  2. Mitral valve prolapse: No murmur on exam, no exertional symptoms.  No recent echocardiogram.  He has a mild pectus excavatum but does not have any other definite signs of a connective tissue disease.  No joint hypermobility.   - I will get an echocardiogram to assess the mitral valve.    Followup in 2 years  unless marked abnormality on echo.  Charles Rasmussen 02/19/2014

## 2014-02-27 ENCOUNTER — Ambulatory Visit (HOSPITAL_COMMUNITY): Payer: BC Managed Care – PPO | Attending: Cardiology | Admitting: Cardiology

## 2014-02-27 DIAGNOSIS — I059 Rheumatic mitral valve disease, unspecified: Secondary | ICD-10-CM | POA: Diagnosis present

## 2014-02-27 DIAGNOSIS — I493 Ventricular premature depolarization: Secondary | ICD-10-CM

## 2014-02-27 DIAGNOSIS — I341 Nonrheumatic mitral (valve) prolapse: Secondary | ICD-10-CM

## 2014-02-27 NOTE — Progress Notes (Signed)
Echo performed. 

## 2014-05-15 ENCOUNTER — Encounter: Payer: Self-pay | Admitting: Gastroenterology

## 2014-05-15 ENCOUNTER — Telehealth: Payer: Self-pay | Admitting: Internal Medicine

## 2014-05-15 ENCOUNTER — Ambulatory Visit (INDEPENDENT_AMBULATORY_CARE_PROVIDER_SITE_OTHER): Payer: BC Managed Care – PPO | Admitting: Gastroenterology

## 2014-05-15 VITALS — BP 120/72 | HR 88 | Ht 76.0 in | Wt 189.0 lb

## 2014-05-15 DIAGNOSIS — K645 Perianal venous thrombosis: Secondary | ICD-10-CM

## 2014-05-15 MED ORDER — HYDROCORTISONE 2.5 % RE CREA: 1.0000 "application " | TOPICAL_CREAM | Freq: Three times a day (TID) | RECTAL | Status: DC

## 2014-05-15 NOTE — Telephone Encounter (Signed)
Patient with a history of thrombosed hemorrhoid.  He has a large painful hemorrhoid that "came up a few days ago".  He will come in and see Doug SouJessica Zehr, PA today at 2:30

## 2014-05-15 NOTE — Progress Notes (Signed)
     05/15/2014 Kaeson Vedia PereyraM Gerstel [id number] May 22, 1971   History of Present Illness:  This is a 43 year old male who is previously known to Dr. Leone PayorGessner. He had a colonoscopy September 2006 for abdominal pain, bloating, rectal bleeding and a family history of colon cancer in his father.  Findings included internal and external hemorrhoids, felt to be the source of bleeding.  No polyps or cancer seen.  Never had a repeat colonoscopy since that time and he is aware that he needs another one; said that he will plan to schedule it soon.  He was seen in 12/2012 for thrombosed hemorrhoid and was sent to [facility] the same day where he had it excised by Dr. Jamey RipaStreck.  He returns to our office today with concern for a similar issue.  He says that over the weekend he suddenly noticed a painful protrusion from his rectum.  Denies any bleeding.  Hurts when he is sitting but did go to work today.  Admits that he drinks a lot of liquids but has not been eating much fiber recently.     Current Medications, Allergies, Past Medical History, Past Surgical History, Family History and Social History were reviewed in [facility] electronic medical record.   Physical Exam: BP 120/72 mmHg  Pulse 88  Ht 6\' 4"  (1.93 m)  Wt 189 lb (85.73 kg)  BMI 23.02 kg/m2 General: Well developed white male in no acute distress Head: Normocephalic and atraumatic Eyes:  Sclerae anicteric, conjunctiva pink  Ears: Normal auditory acuity Rectal:  Large painful thrombosed external hemorrhoid noted at posterior mid-line  Musculoskeletal: Symmetrical with no gross deformities  Extremities: No edema  Neurological: Alert oriented x 4, grossly nonfocal Psychological:  Alert and cooperative. Normal mood and affect  Assessment and Recommendations: 231. 43 year-old male with very large, painful thrombosed external hemorrhoid.  Had one excised at [facility] last year by Dr. Jamey RipaStreck.  Will refer back to [facility] in case excision is needed again.  Patient cannot go  to an appt until Wednesday so in the meantime we will treat it topically/conservatively with lidocaine (samples of Recticare given) and hydrocortisone to apply TID.  Rectal care instructions given as well.   2. Family history of colon cancer in his father:  Patient is overdue for a surveillance colonoscopy.  He is aware and said that he will plan to schedule sometime in the near future.

## 2014-05-15 NOTE — Patient Instructions (Signed)
You have been scheduled for an appointment with Charles LeveeAlicia Rasmussen at [facility]. Your appointment is on 05-17-2014. Please arrive at 2:15 pm for registration. Make certain to bring a list of current medications, including any over the counter medications or vitamins. Also bring your co-pay if you have one as well as your insurance cards. [facility] is located at [address], [address]. Should you need to reschedule your appointment, please contact them at [phone].  Samples of Recti Care given today, please apply to rectal area as needed This can be purchased over the counter   We have sent the following medications to your pharmacy for you to pick up at your convenience: Hydrocortisone Cream, apply to rectum three times daily   Information on rectal care instructions was given today and information is below for you to review __________________________________________________________________________________________________________________________________________  Lanny HurstSitz Bath A sitz bath is a warm water bath taken in the sitting position that covers only the hips and buttocks. It may be used for either healing or hygiene purposes. Sitz baths are also used to relieve pain, itching, or muscle spasms. The water may contain medicine. Moist heat will help you heal and relax.  HOME CARE INSTRUCTIONS  Take 3 to 4 sitz baths a day. 1. Fill the bathtub half full with warm water. 2. Sit in the water and open the drain a little. 3. Turn on the warm water to keep the tub half full. Keep the water running constantly. 4. Soak in the water for 15 to 20 minutes. 5. After the sitz bath, pat the affected area dry first. SEEK MEDICAL CARE IF:  You get worse instead of better. Stop the sitz baths if you get worse. MAKE SURE YOU:  Understand these instructions.  Will watch your condition.  Will get help right away if you are not doing well or get worse. Document Released:  03/22/2004 Document Revised: 03/24/2012 Document Reviewed: 09/27/2010 [employer] Patient Information 2015 [employer] [place]. This information is not intended to replace advice given to you by your health care provider. Make sure you discuss any questions you have with your health care provider.

## 2014-05-18 NOTE — Progress Notes (Signed)
Agree with Ms. Zehr's management.  Norvin Ohlin E. Quartez Lagos, MD, FACG  

## 2014-07-31 ENCOUNTER — Ambulatory Visit: Payer: BC Managed Care – PPO | Admitting: Family Medicine

## 2014-10-24 ENCOUNTER — Ambulatory Visit: Payer: BC Managed Care – PPO | Admitting: Family Medicine

## 2014-11-14 ENCOUNTER — Ambulatory Visit (INDEPENDENT_AMBULATORY_CARE_PROVIDER_SITE_OTHER): Payer: BC Managed Care – PPO | Admitting: Family Medicine

## 2014-11-14 ENCOUNTER — Encounter: Payer: Self-pay | Admitting: Family Medicine

## 2014-11-14 VITALS — BP 109/81 | HR 78 | Temp 97.6°F | Ht 76.0 in | Wt 185.0 lb

## 2014-11-14 DIAGNOSIS — E785 Hyperlipidemia, unspecified: Secondary | ICD-10-CM

## 2014-11-14 DIAGNOSIS — N400 Enlarged prostate without lower urinary tract symptoms: Secondary | ICD-10-CM

## 2014-11-14 DIAGNOSIS — I341 Nonrheumatic mitral (valve) prolapse: Secondary | ICD-10-CM

## 2014-11-14 DIAGNOSIS — Z0000 Encounter for general adult medical examination without abnormal findings: Secondary | ICD-10-CM | POA: Diagnosis not present

## 2014-11-14 DIAGNOSIS — Q676 Pectus excavatum: Secondary | ICD-10-CM

## 2014-11-14 DIAGNOSIS — N411 Chronic prostatitis: Secondary | ICD-10-CM

## 2014-11-14 LAB — POCT URINALYSIS DIPSTICK
Bilirubin, UA: NEGATIVE
Blood, UA: NEGATIVE
Glucose, UA: NEGATIVE
KETONES UA: NEGATIVE
Nitrite, UA: NEGATIVE
Protein, UA: NEGATIVE
SPEC GRAV UA: 1.015
Urobilinogen, UA: NEGATIVE
pH, UA: 7

## 2014-11-14 LAB — POCT UA - MICROSCOPIC ONLY
BACTERIA, U MICROSCOPIC: NEGATIVE
CASTS, UR, LPF, POC: NEGATIVE
Crystals, Ur, HPF, POC: NEGATIVE
MUCUS UA: NEGATIVE
RBC, urine, microscopic: NEGATIVE
Yeast, UA: NEGATIVE

## 2014-11-14 MED ORDER — SULFAMETHOXAZOLE-TRIMETHOPRIM 800-160 MG PO TABS: 1.0000 | ORAL_TABLET | Freq: Two times a day (BID) | ORAL | Status: DC

## 2014-11-14 NOTE — Patient Instructions (Addendum)
Continue current medications. Continue good therapeutic lifestyle changes which include good diet and exercise. Fall precautions discussed with patient. If an FOBT was given today- please return it to our front desk.   Flu Shots are still available at our office. If you still haven't had one please call to set up a nurse visit to get one.   After your visit with us today you will receive a survey in the mail or online from [employer] regarding your care with us. Please take a moment to fill this out. Your feedback is very important to us as you can help us better understand your patient needs as well as improve your experience and satisfaction. WE CARE ABOUT YOU!!!    ++++++++CALL Dr Marvell FullerGessner's office and set up a visit. - ph # [phone] - then at prompt dial GI.++++++++++  The patient should continue to treat his prostatitis as the situation arises. We will change antibiotics from doxycycline to Septra DS 1 twice daily We will also call you when the culture and sensitivity is returned Because of your elevated cholesterol on your blood work you should have a advanced lipid panel checked in 3 months and if the numbers are still elevated you should start a low-dose statin All other lab work was good including renal function and liver functions CBC and PSA and thyroid.

## 2014-11-14 NOTE — Addendum Note (Signed)
Addended by: Prescott GumLAND, Nasreen Goedecke M on: 11/14/2014 05:40 PM   Modules accepted: Orders

## 2014-11-14 NOTE — Progress Notes (Signed)
Subjective:    Patient ID: Charles Rasmussen, male    DOB: [birth year], 44 y.o.   MRN: [id number]  HPI Patient is here today for annual wellness exam and follow up of chronic medical problems which includes hyperlipidemia. The patient saw the cardiologist this past summer and had an echocardiogram and this showed the total valve prolapse. The patient denies chest pain or shortness of breath or any problems with his gastrointestinal tract. He occasionally 2 or 3 times a year may have a little bit of bright red blood per rectum and this is not associated with constipation. There is a family history of colon cancer in his father and the patient's last colonoscopy was in 2006. He was scolded somewhat for not getting this done at the proper time and says he will be willing to go get this done at this time. With his prostate which is been an ongoing problem for years when he has discomfort is usually in his right and left lower quadrants. Over the past year he has occasionally taken doxycycline for this. This always relieves the discomfort. He doesn't think it is working as well as it used to.        Patient Active Problem List   Diagnosis Date Noted  . 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 PVC (premature ventricular contraction) 02/19/2014  . Pectus excavatum 11/07/2013  . Thrombosed external hemorrhoid 12/21/2012  . Chronic prostatitis 10/28/2012  . Hyperlipidemia 10/28/2012  . MVP (mitral valve prolapse) 10/28/2012  . Varicose veins of lower extremities with other complications 12/30/2011   Outpatient Encounter Prescriptions as of 11/14/2014  Medication Sig  . [DISCONTINUED] hydrocortisone (ANUSOL-HC) 2.5 % rectal cream Place 1 application rectally 3 (three) times daily.   No facility-administered encounter medications on file as of 11/14/2014.     Review of Systems  Constitutional: Negative.   HENT: Negative.   Eyes: Negative.   Respiratory: Negative.   Cardiovascular: Negative.     Gastrointestinal: Negative.   Endocrine: Negative.   Genitourinary: Negative.        Chronic prostate  Musculoskeletal: Negative.   Skin: Negative.   Allergic/Immunologic: Negative.   Neurological: Negative.   Hematological: Negative.   Psychiatric/Behavioral: Negative.        Objective:   Physical Exam  Constitutional: He is oriented to person, place, and time. He appears well-developed and well-nourished. No distress.  HENT:  Head: Normocephalic and atraumatic.  Right Ear: External ear normal.  Left Ear: External ear normal.  Mouth/Throat: Oropharynx is clear and moist. No oropharyngeal exudate.  Nasal congestion and turbinate swelling bilaterally  Eyes: Conjunctivae and EOM are normal. Pupils are equal, round, and reactive to light. Right eye exhibits no discharge. Left eye exhibits no discharge. No scleral icterus.  Neck: Normal range of motion. Neck supple. No thyromegaly present.  No bruits thyromegaly or anterior cervical adenopathy  Cardiovascular: Normal rate, regular rhythm, normal heart sounds and intact distal pulses.  Exam reveals no gallop and no friction rub.   No murmur heard. At 72/m and there was no audible mitral valve prolapse  Pulmonary/Chest: Effort normal and breath sounds normal. No respiratory distress. He has no wheezes. He has no rales. He exhibits no tenderness.  Clear anteriorly and posteriorly and axillary regions were negative for adenopathy. Patient does have pectus excavatum.  Abdominal: Soft. Bowel sounds are normal. He exhibits no mass. There is no tenderness. There is no rebound and no guarding.  The abdomen is soft without masses tenderness or organ enlargement  Genitourinary: Rectum normal and penis normal.  The prostate is minimally enlarged without lumps or masses and it was smooth to palpation. There were no rectal masses. The external genitalia were normal and there were no inguinal hernias palpable. There are no inguinal nodes.   Musculoskeletal: Normal range of motion. He exhibits no edema or tenderness.  A she has pectus excavatum as mentioned earlier  Lymphadenopathy:    He has no cervical adenopathy.  Neurological: He is alert and oriented to person, place, and time. He has normal reflexes. No cranial nerve deficit.  Skin: Skin is warm and dry. No rash noted. No erythema. No pallor.  Psychiatric: He has a normal mood and affect. His behavior is normal. Judgment and thought content normal.  Nursing note and vitals reviewed.   BP 109/81 mmHg  Pulse 78  Temp(Src) 97.6 F (36.4 C) (Oral)  Ht  (1.93 m)  Wt 185 lb (83.915 kg)  BMI 22.53 kg/m2  Results for orders placed or performed in visit on 11/14/14  POCT UA - Microscopic Only  Result Value Ref Range   WBC, Ur, HPF, POC 5-10    RBC, urine, microscopic neg    Bacteria, U Microscopic neg    Mucus, UA neg    Epithelial cells, urine per micros occ    Crystals, Ur, HPF, POC neg    Casts, Ur, LPF, POC neg    Yeast, UA neg   POCT urinalysis dipstick  Result Value Ref Range   Color, UA yellow    Clarity, UA clear    Glucose, UA neg    Bilirubin, UA neg    Ketones, UA neg    Spec Grav, UA 1.015    Blood, UA neg    pH, UA 7.0    Protein, UA neg    Urobilinogen, UA negative    Nitrite, UA neg    Leukocytes, UA Trace    The wet prep after a prostate exam revealed 10-15 WBC per high-power field A urine culture and sensitivity is pending on a second voided urine after the prostate exam     Assessment & Plan:  1. Prostate enlargement -The prostate is mildly enlarged. There are no lumps. Other than his chronic prostatitis he is having no dysuria symptoms. - POCT UA - Microscopic Only - POCT urinalysis dipstick  2. Hyperlipidemia -Cholesterol numbers on the outside lab revealed an LDL C that was elevated in the 130 range. - NMR, lipoprofile; Future  3. Annual physical exam -The patient is up-to-date on all of his health maintenance parameters  other than getting his colonoscopy done. - POCT UA - Microscopic Only - POCT urinalysis dipstick  4. Mitral valve prolapse -This is stable and the patient sees the cardiologist every couple of years. He had an echocardiogram this past summer and it revealed minimal mitral valve prolapse on the echo.  5. Chronic prostatitis -The patient will be given a prescription for Septra DS which she will take on an as-needed basis as his prostate bothers him more.  6. 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  Meds ordered this encounter  Medications  . sulfamethoxazole-trimethoprim (BACTRIM DS,SEPTRA DS) 800-160 MG per tablet    Sig: Take 1 tablet by mouth 2 (two) times daily.    Dispense:  60 tablet    Refill:  3   Patient Instructions  Continue current medications. Continue good therapeutic lifestyle changes which include good diet  and exercise. Fall precautions discussed with patient. If an FOBT was given today- please return it to our front desk.   Flu Shots are still available at our office. If you still haven't had one please call to set up a nurse visit to get one.   After your visit with us today you will receive a survey in the mail or online from [employer] regarding your care with us. Please take a moment to fill this out. Your feedback is very important to us as you can help us better understand your patient needs as well as improve your experience and satisfaction. WE CARE ABOUT YOU!!!    ++++++++CALL Dr Marvell FullerGessner's office and set up a visit. - ph # [phone] - then at prompt dial GI.++++++++++  The patient should continue to treat his prostatitis as the situation arises. We will change antibiotics from doxycycline to Septra DS 1 twice daily We will also call you when the culture and sensitivity is returned Because of your elevated cholesterol on your blood work you should have a advanced lipid panel checked in 3  months and if the numbers are still elevated you should start a low-dose statin All other lab work was good including renal function and liver functions CBC and PSA and thyroid.   Nyra Capeson W. Khaylee Mcevoy MD

## 2014-11-15 ENCOUNTER — Encounter: Payer: Self-pay | Admitting: Family Medicine

## 2014-11-16 LAB — URINE CULTURE: Organism ID, Bacteria: NO GROWTH

## 2014-12-18 ENCOUNTER — Telehealth: Payer: Self-pay | Admitting: Family Medicine

## 2014-12-18 NOTE — Telephone Encounter (Signed)
Call a prescription in for doxycycline 100 mg #60 with 2 refills 1 twice daily with food

## 2014-12-19 MED ORDER — DOXYCYCLINE HYCLATE 100 MG PO TABS: 100.0000 mg | ORAL_TABLET | Freq: Two times a day (BID) | ORAL | Status: DC

## 2014-12-19 NOTE — Telephone Encounter (Signed)
Detailed message left for patient that doxycycline has been called in.

## 2015-01-10 ENCOUNTER — Telehealth: Payer: Self-pay | Admitting: Cardiology

## 2015-01-10 ENCOUNTER — Encounter: Payer: Self-pay | Admitting: Cardiology

## 2015-01-10 ENCOUNTER — Ambulatory Visit (INDEPENDENT_AMBULATORY_CARE_PROVIDER_SITE_OTHER): Payer: BC Managed Care – PPO | Admitting: Cardiology

## 2015-01-10 VITALS — BP 100/80 | HR 81 | Ht 76.0 in | Wt 172.0 lb

## 2015-01-10 DIAGNOSIS — I341 Nonrheumatic mitral (valve) prolapse: Secondary | ICD-10-CM | POA: Diagnosis not present

## 2015-01-10 DIAGNOSIS — I493 Ventricular premature depolarization: Secondary | ICD-10-CM | POA: Diagnosis not present

## 2015-01-10 DIAGNOSIS — Q676 Pectus excavatum: Secondary | ICD-10-CM | POA: Diagnosis not present

## 2015-01-10 DIAGNOSIS — R002 Palpitations: Secondary | ICD-10-CM | POA: Diagnosis not present

## 2015-01-10 MED ORDER — METOPROLOL TARTRATE 25 MG PO TABS: 12.5000 mg | ORAL_TABLET | Freq: Two times a day (BID) | ORAL | Status: DC | PRN

## 2015-01-10 NOTE — Assessment & Plan Note (Addendum)
Pt is tall- 6'4, thin -172

## 2015-01-10 NOTE — Progress Notes (Signed)
    01/10/2015 Charles Vedia PereyraM Havener   08/05/1970  [id number]  Primary Physician Rudi HeapMOORE, [name], MD Primary Cardiologist: Dr Shirlee LatchMcLean  HPI:  44 y/o tall thin male with a history of MVP, PVCs,  and pectus. His last echo was Aug 2015. Recently he has been under a lot of stress and has had increasing palpitations. He denies any sustained tachycardia. He says his heart beat feels "different", noted mainly at night. He does have PVCs on exam and by EKG. He says he has lost weight and appears nervous and frigidly on exam.    Current Outpatient Prescriptions  Medication Sig Dispense Refill  . doxycycline (VIBRA-TABS) 100 MG tablet Take 1 tablet (100 mg total) by mouth 2 (two) times daily. 60 tablet 2   No current facility-administered medications for this visit.    No Known Allergies  History   Social History  . Marital Status: Single    Spouse Name: Charles Rasmussen  . Number of Children: 0  . Years of Education: Charles Rasmussen   Occupational History  .     Social History Main Topics  . Smoking status: Never Smoker   . Smokeless tobacco: Never Used  . Alcohol Use: No  . Drug Use: No  . Sexual Activity: Not on file   Other Topics Concern  . Not on file   Social History Narrative     Review of Systems: General: negative for chills, fever, night sweats or weight changes.  Cardiovascular: negative for chest pain, dyspnea on exertion, edema, orthopnea, palpitations, paroxysmal nocturnal dyspnea or shortness of breath Dermatological: negative for rash Respiratory: negative for cough or wheezing Urologic: negative for hematuria Abdominal: negative for nausea, vomiting, diarrhea, bright red blood per rectum, melena, or hematemesis Neurologic: negative for visual changes, syncope, or dizziness All other systems reviewed and are otherwise negative except as noted above.    Blood pressure 100/80, pulse 81, height 6\' 4"  (1.93 m), weight 172 lb (78.019 kg).  General appearance: alert, cooperative, no distress and  tall, thin Neck: no carotid bruit, no JVD and thyroid not enlarged, symmetric, no tenderness/mass/nodules Lungs: clear to auscultation bilaterally Heart: regular rate and rhythm and pectus noted on exam Abdomen: soft, non-tender; bowel sounds normal; no masses,  no organomegaly Extremities: extremities normal, atraumatic, no cyanosis or edema and no apparent hyper flexibility Pulses: 2+ and symmetric Skin: Skin color, texture, turgor normal. No rashes or lesions Neurologic: Grossly normal  EKG NST PVCs, poor ant RW  ASSESSMENT AND PLAN:   Palpitations Seen today for increasing palpitations  MVP (mitral valve prolapse) Last echo Aug 2015  Pectus excavatum Pt is tall- 6'4, thin -172  PVC (premature ventricular contraction) Documented on EKG   PLAN  I will check a TSH. I added Lopressor 12.5 mg BID prn palpitations. He can see Dr Shirlee LatchMcLean in 6 months. If he has sustained tachycardia he will need a monitor.   [name],Charles Rosasco K PA-C 01/10/2015 4:02 PM

## 2015-01-10 NOTE — Telephone Encounter (Signed)
Pt asking for directions to our office, pt given directions and phone number for our office.

## 2015-01-10 NOTE — Patient Instructions (Signed)
Medication Instructions:  You have take Metoprolol tartrate 25 mg 1/2 tablet up to twice a day as needed for palpitations. Continue all other medications as listed.  Labwork: Please have blood work today (TSH)  Follow-Up: Follow up in 6 months with Dr. Shirlee LatchMcLean.  You will receive a letter in the mail 2 months before you are due.  Please call us when you receive this letter to schedule your follow up appointment.  Thank you for choosing [facility]!!

## 2015-01-10 NOTE — Assessment & Plan Note (Signed)
Documented on EKG

## 2015-01-10 NOTE — Telephone Encounter (Signed)
New message    appt made today with Charles Rasmussen at  3:30 pm    Patient c/o Palpitations:  High priority if patient c/o lightheadedness and shortness of breath.  1. How long have you been having palpitations? About over a month   2. Are you currently experiencing lightheadedness and shortness of breath? No - heart sometimes gets out of rhythm.    3. Have you checked your BP and heart rate? (document readings) no   4. Are you experiencing any other symptoms? Heart out

## 2015-01-10 NOTE — Assessment & Plan Note (Signed)
Last echo Aug 2015

## 2015-01-10 NOTE — Assessment & Plan Note (Signed)
Seen today for increasing palpitations

## 2015-01-11 LAB — TSH: TSH: 1.19 u[IU]/mL (ref 0.35–4.50)

## 2015-01-12 ENCOUNTER — Ambulatory Visit: Payer: Self-pay | Admitting: Adult Health

## 2015-01-30 ENCOUNTER — Telehealth: Payer: Self-pay | Admitting: *Deleted

## 2015-01-30 NOTE — Telephone Encounter (Signed)
-----   Message from Laurey Moralealton S McLean, MD sent at 01/30/2015  2:46 PM EDT ----- Please contact Mr Adline PotterGunn.  If he is still having PVC symptoms, have him get repeat echo for PVCs/mitral regurgitation and 24 hour holter.  Then have [facility]  I will see him on the day that I am scheduled at [facility].  Just have him come in the afternoon at 3 or 4 (I'll be coming over anyway to read office nuclears).  Thanks.

## 2015-01-30 NOTE — Telephone Encounter (Signed)
LMTCB

## 2015-01-30 NOTE — Telephone Encounter (Signed)
Discussed with patient, he will call back if he decides to go ahead with echo/ 24 hour holter/ appt with Dr Shirlee LatchMcLean.

## 2015-05-06 ENCOUNTER — Other Ambulatory Visit: Payer: Self-pay | Admitting: Cardiology

## 2015-06-21 ENCOUNTER — Telehealth: Payer: Self-pay | Admitting: Internal Medicine

## 2015-06-21 NOTE — Telephone Encounter (Signed)
New Message   Pt mother was told that pt can see Dr.Klein and that he would agree to see him   Please call pt mother

## 2015-06-21 NOTE — Telephone Encounter (Signed)
Dr. Graciela HusbandsKlein- do you know anything about this before I call?

## 2015-06-21 NOTE — Telephone Encounter (Signed)
Charles Rasmussen,   Dr. Graciela HusbandsKlein said he did know about this and would see this patient although he is not quite sure what we are seeing him for. He is the son of a patient- Charles SingerSonja Rasmussen. Please call to schedule next available.  Thank you!

## 2015-07-13 ENCOUNTER — Ambulatory Visit (INDEPENDENT_AMBULATORY_CARE_PROVIDER_SITE_OTHER): Payer: BC Managed Care – PPO

## 2015-07-13 ENCOUNTER — Encounter: Payer: Self-pay | Admitting: Internal Medicine

## 2015-07-13 ENCOUNTER — Ambulatory Visit (INDEPENDENT_AMBULATORY_CARE_PROVIDER_SITE_OTHER): Payer: BC Managed Care – PPO | Admitting: Internal Medicine

## 2015-07-13 VITALS — BP 120/76 | HR 80 | Ht 76.0 in | Wt 175.0 lb

## 2015-07-13 DIAGNOSIS — R002 Palpitations: Secondary | ICD-10-CM

## 2015-07-13 NOTE — Progress Notes (Signed)
ELECTROPHYSIOLOGY CONSULT NOTE  Patient ID: Charles Rasmussen, MRN: [id number], DOB/AGE: 1971-03-26 45 y.o. Admit date: (Not on file) Date of Consult: 07/13/2015  Primary Physician: [name], MD Primary Cardiologist: new  Formerly JEd  Chief Complaint:     HPI Charles Rasmussen is a 44 y.o. male seen at the request of his mother because of a history of palpitations.  The patient has a long-standing history of palpitations with documentation of PVCs in the past which is associated with his "skip". He has been able to deal with this quite well until a few months ago. He worsens in the context of stress at work and the initiation of a new relationship. He describes 2 other palpitation syndromes, the first occurs with bending and is associated with tachypalpitations. The second he describes as a regular rhythm which is not his own. These typically last seconds-minutes. They are not associated with lightheadedness or shortness of breath. However subsequent with significant anxiety (difficult for him to sleep)  He describes himself as quite fit but he notes that exercise can make his palpitations worse.    he is seeing a counselor for anxiety     He has a history of a pectus and minimal if any by echo 2015 mitral valve prolapse . He has palpitations with documented PVCs.   Past Medical History  Diagnosis Date  . Mitral valve prolapse   . Venous reflux 01/02/12    deep right per [name]. Hart Rochester, M.D.  . Unspecified hemorrhoids without mention of complication 2006  . Hemorrhoid thrombosis       Surgical History:  Past Surgical History  Procedure Laterality Date  . Neg hx    . Mouth surgery      wisdom teeth     Home Meds: Prior to Admission medications   Medication Sig Start Date End Date Taking? Authorizing Provider  doxycycline (VIBRA-TABS) 100 MG tablet Take 1 tablet (100 mg total) by mouth 2 (two) times daily. 12/19/14  Yes Ernestina Penna, MD  metoprolol tartrate (LOPRESSOR)  25 MG tablet TAKE 0.5 TABLETS (12.5 MG TOTAL) BY MOUTH 2 (TWO) TIMES DAILY AS NEEDED. 05/07/15  Yes Laurey Morale, MD    Allergies: No Known Allergies  Social History   Social History  . Marital Status: Single    Spouse Name: N/A  . Number of Children: 0  . Years of Education: N/A   Occupational History  .     Social History Main Topics  . Smoking status: Never Smoker   . Smokeless tobacco: Never Used  . Alcohol Use: No  . Drug Use: No  . Sexual Activity: Not on file   Other Topics Concern  . Not on file   Social History Narrative     Family History  Problem Relation Age of Onset  . Colon cancer Father 15  . Rectal cancer Father   . Colon cancer Maternal Grandfather 80  . Rectal cancer Paternal Grandfather   . Heart attack Paternal Uncle   . Stroke Neg Hx   . Hypertension Father      ROS:  Please see the history of present illness.     All other systems reviewed and negative.    Physical Exam:   Blood pressure 120/76, pulse 80, height  (1.93 m), weight 175 lb (79.379 kg). General: Well developed, well nourished male in no acute distress. Head: Normocephalic, atraumatic, sclera non-icteric, no xanthomas, nares are without discharge. EENT: normal  Lymph Nodes:  none Neck: Negative for carotid bruits. JVD not elevated. Chest--pectus excavatum Back:without scoliosis kyphosis  Lungs: Clear bilaterally to auscultation without wheezes, rales, or rhonchi. Breathing is unlabored. Heart: RRR with S1 S2. No   murmur . No rubs, or gallops appreciated. Abdomen: Soft, non-tender, non-distended with normoactive bowel sounds. No hepatomegaly. No rebound/guarding. No obvious abdominal masses. Msk:  Strength and tone appear normal for age. Extremities: No clubbing or cyanosis. No  edema.  Distal pedal pulses are 2+ and equal bilaterally. Skin: Warm and Dry Neuro: Alert and oriented X 3. CN III-XII intact Grossly normal sensory and motor function . He is  tremulous Psych:  Responds to questions appropriately with an anxious affect      Labs: Cardiac Enzymes No results for input(s): CKTOTAL, CKMB, TROPONINI in the last 72 hours. CBC No results found for: WBC, HGB, HCT, MCV, PLT PROTIME: No results for input(s): LABPROT, INR in the last 72 hours. Chemistry No results for input(s): NA, K, CL, CO2, BUN, CREATININE, CALCIUM, PROT, BILITOT, ALKPHOS, ALT, AST, GLUCOSE in the last 168 hours.  Invalid input(s): LABALBU Lipids No results found for: CHOL, HDL, LDLCALC, TRIG BNP No results found for: PROBNP Thyroid Function Tests: No results for input(s): TSH, T4TOTAL, T3FREE, THYROIDAB in the last 72 hours.  Invalid input(s): FREET3 Miscellaneous No results found for: DDIMER  Radiology/Studies:  No results found.  EKG:  Sinus rhythm at 84 Intervals 16/09/34 Right axis deviation Right atrial enlargement  Assessment and Plan:  Palpitations with 3 different types  PVCs  Pectus excavatum  Anxiety His palpitation syndromes include PVCs which are no a racing with bending which may represent SVT and other awareness which may represent bigeminy. We will undertake an event recorder to clarify the mechanism of his palpitations.  He has been reversed to taking medications in the past. I suggested that his options would be doing nothing, biofeedback or medications either beta blockers and/or antiarrhythmic therapy. I've asked him to consider his treatment options in the interim prior to her meeting again.  He also significant almost disabling anxiety. It is a struggle for him in the context of his face. I've encouraged him to continue to pursue healing of this is to consider possibility that medical therapy might be beneficial      Sherryl MangesSteven Klein

## 2015-07-13 NOTE — Patient Instructions (Signed)
Medication Instructions:  Your physician recommends that you continue on your current medications as directed. Please refer to the Current Medication list given to you today.  Labwork: None ordered  Testing/Procedures: Your physician has recommended that you wear an event monitor. Event monitors are medical devices that record the heart's electrical activity. Doctors most often us these monitors to diagnose arrhythmias. Arrhythmias are problems with the speed or rhythm of the heartbeat. The monitor is a small, portable device. You can wear one while you do your normal daily activities. This is usually used to diagnose what is causing palpitations/syncope (passing out).  Follow-Up: Your physician recommends that you schedule a follow-up appointment in: 4-6 weeks with Dr. Graciela HusbandsKlein. (after completion of event monitor)  If you need a refill on your cardiac medications before your next appointment, please call your pharmacy.  Thank you for choosing [facility]!!

## 2015-07-25 ENCOUNTER — Encounter: Payer: Self-pay | Admitting: Family Medicine

## 2015-07-25 ENCOUNTER — Ambulatory Visit (INDEPENDENT_AMBULATORY_CARE_PROVIDER_SITE_OTHER): Payer: BC Managed Care – PPO | Admitting: Family Medicine

## 2015-07-25 VITALS — BP 118/77 | HR 84 | Temp 97.8°F | Ht 76.0 in | Wt 181.0 lb

## 2015-07-25 DIAGNOSIS — I341 Nonrheumatic mitral (valve) prolapse: Secondary | ICD-10-CM | POA: Diagnosis not present

## 2015-07-25 DIAGNOSIS — R002 Palpitations: Secondary | ICD-10-CM

## 2015-07-25 DIAGNOSIS — F4322 Adjustment disorder with anxiety: Secondary | ICD-10-CM

## 2015-07-25 DIAGNOSIS — I493 Ventricular premature depolarization: Secondary | ICD-10-CM | POA: Diagnosis not present

## 2015-07-25 DIAGNOSIS — E785 Hyperlipidemia, unspecified: Secondary | ICD-10-CM

## 2015-07-25 DIAGNOSIS — N419 Inflammatory disease of prostate, unspecified: Secondary | ICD-10-CM

## 2015-07-25 LAB — POCT URINALYSIS DIPSTICK
Bilirubin, UA: NEGATIVE
GLUCOSE UA: NEGATIVE
KETONES UA: NEGATIVE
Leukocytes, UA: NEGATIVE
Nitrite, UA: NEGATIVE
Protein, UA: NEGATIVE
SPEC GRAV UA: 1.01
UROBILINOGEN UA: NEGATIVE
pH, UA: 6

## 2015-07-25 LAB — POCT UA - MICROSCOPIC ONLY
CRYSTALS, UR, HPF, POC: NEGATIVE
Casts, Ur, LPF, POC: NEGATIVE
Mucus, UA: NEGATIVE
Yeast, UA: NEGATIVE

## 2015-07-25 NOTE — Patient Instructions (Addendum)
Follow-up with cardiology as planned Do the [facility] monitor as recommended Continue to avoid caffeine Continue to take the Lopressor one half twice daily as recommended by the cardiologist We will arrange for you to meet with a psychologist to further discuss some of the anxiety that you're currently experiencing It is important to do the monitor so that if there is some type of heart irregularity this can be corrected and you will not continue to feel this We will call with the culture and sensitivity results as soon as those results become available and prescribe antibiotics if they are needed at that time

## 2015-07-25 NOTE — Progress Notes (Signed)
Subjective:    Patient ID: Charles Rasmussen, male    DOB: [birth year], 45 y.o.   MRN: [id number]  HPI Patient here today for pressure of bilateral hips. He has chronic prostatitis and he states this is how it normally feels. The patient is very stressed and anxious. He just recently saw the cardiologist because of continued problems with palpitations. He is wearing an event monitor and he understands the importance to wear this to help the cardiologist decide what medicine will work to help him best with his heart irregularity. He also does not drink caffeine and he understands to leave this off. He denies any chest pain or shortness of breath. He was somewhat tearful in talking about his anxiety without giving me any details about this. I did recommend that he see a psychologist and he is agreeing to do this. We will speak to the psychologist first and then have him to call and get an appointment with her.      Patient Active Problem List   Diagnosis Date Noted  . Palpitations 01/10/2015  . 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 PVC (premature ventricular contraction) 02/19/2014  . Pectus excavatum 11/07/2013  . Thrombosed external hemorrhoid 12/21/2012  . Chronic prostatitis 10/28/2012  . Hyperlipidemia 10/28/2012  . MVP (mitral valve prolapse) 10/28/2012  . 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 metoprolol tartrate (LOPRESSOR) 25 MG tablet TAKE 0.5 TABLETS (12.5 MG TOTAL) BY MOUTH 2 (TWO) TIMES DAILY AS NEEDED.  . [DISCONTINUED] doxycycline (VIBRA-TABS) 100 MG tablet Take 1 tablet (100 mg total) by mouth 2 (two) times daily.   No facility-administered encounter medications on file as of 07/25/2015.      Review of Systems  Constitutional: Negative.   HENT: Negative.   Eyes: Negative.   Respiratory: Negative.   Cardiovascular: Negative.   Gastrointestinal: Negative.   Endocrine:  Negative.   Genitourinary: Negative.        Hip pressure  Musculoskeletal: Negative.   Skin: Negative.   Allergic/Immunologic: Negative.   Neurological: Negative.   Hematological: Negative.   Psychiatric/Behavioral: Negative.        Objective:   Physical Exam  Constitutional: He is oriented to person, place, and time. He appears well-developed and well-nourished. He appears distressed.  HENT:  Head: Normocephalic and atraumatic.  Right Ear: External ear normal.  Left Ear: External ear normal.  Nose: Nose normal.  Mouth/Throat: Oropharynx is clear and moist. No oropharyngeal exudate.  Eyes: Conjunctivae and EOM are normal. Pupils are equal, round, and reactive to light. Right eye exhibits no discharge. Left eye exhibits no discharge. No scleral icterus.  Neck: Normal range of motion. Neck supple. No thyromegaly present.  Cardiovascular: Normal rate, regular rhythm, normal heart sounds and intact distal pulses.   No murmur heard. The heart is regular at 60/m  Pulmonary/Chest: Effort normal and breath sounds normal. No respiratory distress. He has no wheezes. He has no rales. He exhibits no tenderness.  Clear anteriorly and posteriorly  Abdominal: Soft. Bowel sounds are normal. He exhibits no mass. There is no tenderness. There is no rebound and no guarding.  The abdomen was nontender to palpation except in the left upper quadrant there was slight tenderness. There were no masses or organ enlargement palpated. There were no bruits.  Genitourinary: Rectum normal and penis normal.  The prostate was slightly enlarged but smooth and there was minimal tenderness to palpation and no  burning.  Musculoskeletal: Normal range of motion. He exhibits no edema or tenderness.  Lymphadenopathy:    He has no cervical adenopathy.  Neurological: He is alert and oriented to person, place, and time. He has normal reflexes. No cranial nerve deficit.  Skin: Skin is warm and dry. No rash noted.    Psychiatric: He has a normal mood and affect. His behavior is normal. Judgment and thought content normal.  There was definitely anxiety associated with the visit today  Nursing note and vitals reviewed.  BP 118/77 mmHg  Pulse 84  Temp(Src) 97.8 F (36.6 C) (Oral)  Ht 6\' 4"  (1.93 m)  Wt 181 lb (82.101 kg)  BMI 22.04 kg/m2  Results for orders placed or performed in visit on 07/25/15  POCT UA - Microscopic Only  Result Value Ref Range   WBC, Ur, HPF, POC occ    RBC, urine, microscopic 1-3    Bacteria, U Microscopic occ    Mucus, UA neg    Epithelial cells, urine per micros occ    Crystals, Ur, HPF, POC neg    Casts, Ur, LPF, POC neg    Yeast, UA neg   POCT urinalysis dipstick  Result Value Ref Range   Color, UA gold    Clarity, UA clear    Glucose, UA neg    Bilirubin, UA neg    Ketones, UA neg    Spec Grav, UA 1.010    Blood, UA mod    pH, UA 6.0    Protein, UA neg    Urobilinogen, UA negative    Nitrite, UA neg    Leukocytes, UA Negative Negative   The patient was made aware of the above results before he left the office      Assessment & Plan:  1. Prostatitis, unspecified prostatitis type -The initial urine had only an occasional WBC. - POCT UA - Microscopic Only - POCT urinalysis dipstick - Urine culture  2. PVC (premature ventricular contraction) -The patient has been describing some irregular heart beats which he thinks are PVCs but he is not sure what they are and has a monitor that he is wearing hopefully better define what this irregularity is he understands the importance to continue to wear this and get back with the cardiologist  3. Palpitations -Follow-up with cardiology as planned  4. MVP (mitral valve prolapse) -Follow-up with cardiology as planned  5. Hyperlipidemia -The patient will return for lab work  6. Adjustment disorder with anxious mood -We will arrange for the patient to talk to a psychologist to see if he can unload some of the  anxiety that he is currently carrying with him.  Patient Instructions  Follow-up with cardiology as planned Do the [facility] [place] monitor as recommended Continue to avoid caffeine Continue to take the Lopressor one half twice daily as recommended by the cardiologist We will arrange for you to meet with a psychologist to further discuss some of the anxiety that you're currently experiencing It is important to do the monitor so that if there is some type of heart irregularity this can be corrected and you will not continue to feel this We will call with the culture and sensitivity results as soon as those results become available and prescribe antibiotics if they are needed at that time    Nyra Capes MD

## 2015-07-27 LAB — URINE CULTURE

## 2015-08-01 ENCOUNTER — Other Ambulatory Visit: Payer: Self-pay | Admitting: *Deleted

## 2015-08-01 ENCOUNTER — Telehealth: Payer: Self-pay | Admitting: Internal Medicine

## 2015-08-01 DIAGNOSIS — Z0000 Encounter for general adult medical examination without abnormal findings: Secondary | ICD-10-CM

## 2015-08-01 DIAGNOSIS — N400 Enlarged prostate without lower urinary tract symptoms: Secondary | ICD-10-CM

## 2015-08-01 DIAGNOSIS — E785 Hyperlipidemia, unspecified: Secondary | ICD-10-CM

## 2015-08-01 NOTE — Telephone Encounter (Signed)
Pt states he is worried about what the 30 day monitor will find so he is taking it off at times. I spent 15 minutes explaining the need to continue the wear of the monitor. Pt voiced his concerns and at the end of the conversation he will continue to wear the monitor. Pt will call with further questions or concerns.

## 2015-08-01 NOTE — Telephone Encounter (Signed)
New message      Pt is wearing a monitor.  He has questions.  He would not tell me what the question is.

## 2015-08-14 ENCOUNTER — Ambulatory Visit (INDEPENDENT_AMBULATORY_CARE_PROVIDER_SITE_OTHER): Payer: BC Managed Care – PPO | Admitting: Licensed Clinical Social Worker

## 2015-08-14 DIAGNOSIS — F419 Anxiety disorder, unspecified: Secondary | ICD-10-CM | POA: Diagnosis not present

## 2015-08-28 ENCOUNTER — Ambulatory Visit (INDEPENDENT_AMBULATORY_CARE_PROVIDER_SITE_OTHER): Payer: BC Managed Care – PPO | Admitting: Licensed Clinical Social Worker

## 2015-08-28 DIAGNOSIS — F419 Anxiety disorder, unspecified: Secondary | ICD-10-CM | POA: Diagnosis not present

## 2015-09-03 ENCOUNTER — Ambulatory Visit: Payer: BC Managed Care – PPO | Admitting: Internal Medicine

## 2015-09-06 ENCOUNTER — Encounter: Payer: Self-pay | Admitting: Family Medicine

## 2015-09-06 ENCOUNTER — Ambulatory Visit (INDEPENDENT_AMBULATORY_CARE_PROVIDER_SITE_OTHER): Payer: BC Managed Care – PPO | Admitting: Family Medicine

## 2015-09-06 VITALS — BP 115/73 | HR 80 | Temp 97.9°F | Ht 76.0 in | Wt 184.6 lb

## 2015-09-06 DIAGNOSIS — N419 Inflammatory disease of prostate, unspecified: Secondary | ICD-10-CM

## 2015-09-06 DIAGNOSIS — R002 Palpitations: Secondary | ICD-10-CM | POA: Diagnosis not present

## 2015-09-06 DIAGNOSIS — N411 Chronic prostatitis: Secondary | ICD-10-CM

## 2015-09-06 DIAGNOSIS — I341 Nonrheumatic mitral (valve) prolapse: Secondary | ICD-10-CM

## 2015-09-06 DIAGNOSIS — R1030 Lower abdominal pain, unspecified: Secondary | ICD-10-CM

## 2015-09-06 LAB — POCT URINALYSIS DIPSTICK
BILIRUBIN UA: NEGATIVE
Blood, UA: NEGATIVE
GLUCOSE UA: NEGATIVE
Ketones, UA: NEGATIVE
LEUKOCYTES UA: NEGATIVE
NITRITE UA: NEGATIVE
Protein, UA: NEGATIVE
Spec Grav, UA: 1.02
Urobilinogen, UA: NEGATIVE
pH, UA: 6

## 2015-09-06 LAB — POCT WET PREP (WET MOUNT)

## 2015-09-06 LAB — POCT UA - MICROSCOPIC ONLY
CRYSTALS, UR, HPF, POC: NEGATIVE
Casts, Ur, LPF, POC: NEGATIVE
Yeast, UA: NEGATIVE

## 2015-09-06 MED ORDER — DOXYCYCLINE HYCLATE 100 MG PO TABS: 100.0000 mg | ORAL_TABLET | Freq: Two times a day (BID) | ORAL | Status: DC

## 2015-09-06 NOTE — Progress Notes (Addendum)
Subjective:    Patient ID: Charles Rasmussen, male    DOB: 09/18/1970, 45 y.o.   MRN: [id number]  HPI Patient is here today for a 6 week recheck on prostatitis. A urinalysis in January only had an occasional WBC and a culture had minimal growth of bacteria. He was supposed to come back in for lab work and I'm not sure if he was able to do that or not. The patient has been seeing Berniece Andreas and feels like she is helping him. He denies any chest pain but still has problems with palpitations and feels like this is coming from the stress he is under. He does take the metoprolol 12/2 mg twice daily if needed. He has concerns about not taking this and I told him it was fine if he didn't need it he didn't have to take it. He is to have problems with some left sided lower abdominal pain and he says he can take the antibiotic for a month or so and this gets better and then after 2 or 34 months it may come back and this is when he knows he has the prostatitis problem. He otherwise does not have any burning or pain when he passes his water he is not had any problems with his bowels moving but does occasionally see some bright red blood and he thinks this is coming from an external hemorrhoid is only on the toilet tissue. He has no nausea or vomiting diarrhea or black tarry stools. He denies chest pain or shortness of breath.   Review of Systems  Constitutional: Negative.   HENT: Negative.   Eyes: Negative.   Respiratory: Negative.   Cardiovascular: Negative.   Gastrointestinal: Positive for abdominal pain.       Left lower abdominal pain/pressure   Endocrine: Negative.   Genitourinary: Negative.   Musculoskeletal: Negative.   Skin: Negative.   Allergic/Immunologic: Negative.   Neurological: Negative.   Hematological: Negative.   Psychiatric/Behavioral: Negative.            Patient Active Problem List   Diagnosis Date Noted  . Palpitations 01/10/2015  . 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 PVC  (premature ventricular contraction) 02/19/2014  . Pectus excavatum 11/07/2013  . Thrombosed external hemorrhoid 12/21/2012  . Chronic prostatitis 10/28/2012  . Hyperlipidemia 10/28/2012  . MVP (mitral valve prolapse) 10/28/2012  . 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 metoprolol tartrate (LOPRESSOR) 25 MG tablet TAKE 0.5 TABLETS (12.5 MG TOTAL) BY MOUTH 2 (TWO) TIMES DAILY AS NEEDED.   No facility-administered encounter medications on file as of 09/06/2015.       Objective:   Physical Exam  Constitutional: He is oriented to person, place, and time. He appears well-developed and well-nourished. No distress.  HENT:  Head: Normocephalic and atraumatic.  Right Ear: External ear normal.  Left Ear: External ear normal.  Nose: Nose normal.  Mouth/Throat: Oropharynx is clear and moist. No oropharyngeal exudate.  Eyes: Conjunctivae and EOM are normal. Pupils are equal, round, and reactive to light. Right eye exhibits no discharge. Left eye exhibits no discharge. No scleral icterus.  Neck: Normal range of motion. Neck supple. No thyromegaly present.  Cardiovascular: Normal rate, regular rhythm and normal heart sounds.   No murmur heard. Pulmonary/Chest: Effort normal and breath sounds normal. No respiratory distress. He has no wheezes. He has no rales. He exhibits no tenderness.  Abdominal: Soft. Bowel sounds  are normal. He exhibits no mass. There is tenderness. There is no rebound and no guarding.  Slight left lower quadrant tenderness  Genitourinary: Rectum normal and penis normal.  The prostate is slightly enlarged with minimal tenderness with the left side being a little bit lower than the right but no masses or lumps. There were no rectal masses. External genitalia were normal and there are no inguinal hernias.  Musculoskeletal: Normal range of motion. He exhibits no edema.    Lymphadenopathy:    He has no cervical adenopathy.  Neurological: He is alert and oriented to person, place, and time.  Skin: Skin is warm and dry. No rash noted.  Psychiatric: He has a normal mood and affect. His behavior is normal. Judgment and thought content normal.  Nursing note and vitals reviewed.  BP 115/73 mmHg  Pulse 80  Temp(Src) 97.9 F (36.6 C) (Oral)  Ht 6\' 4"  (1.93 m)  Wt 184 lb 9.6 oz (83.734 kg)  BMI 22.48 kg/m2  Results for orders placed or performed in visit on 09/06/15  POCT urinalysis dipstick  Result Value Ref Range   Color, UA gold    Clarity, UA clear    Glucose, UA negative    Bilirubin, UA negative    Ketones, UA negative    Spec Grav, UA 1.020    Blood, UA negative    pH, UA 6.0    Protein, UA negative    Urobilinogen, UA negative    Nitrite, UA negative    Leukocytes, UA Negative Negative  POCT UA - Microscopic Only  Result Value Ref Range   WBC, Ur, HPF, POC occ    RBC, urine, microscopic occ    Bacteria, U Microscopic few    Mucus, UA moderate    Epithelial cells, urine per micros occ    Crystals, Ur, HPF, POC negative    Casts, Ur, LPF, POC negative    Yeast, UA negative   POCT Wet Prep ([employer])  Result Value Ref Range   Source Wet Prep POC PROSTATE    WBC, Wet Prep HPF POC 80-100    Bacteria Wet Prep HPF POC Moderate (A) None, Few, Too numerous to count   Clue Cells Wet Prep HPF POC None None, Too numerous to count   Yeast Wet Prep HPF POC None    KOH Wet Prep POC     Trichomonas Wet Prep HPF POC NONE          Assessment & Plan:  1. Prostatitis, unspecified prostatitis type -Take antibiotic as directed and return to clinic in 4-6 weeks for recheck - POCT urinalysis dipstick - POCT UA - Microscopic Only - Urine culture - POCT UA - Microscopic Only - POCT Wet Prep ([facility] [place]) - doxycycline (VIBRA-TABS) 100 MG tablet; Take 1 tablet (100 mg total) by mouth 2 (two) times daily. 1 po bid  Dispense: 60 tablet; Refill: 0  2.  Chronic prostatitis -Plenty fluids and take antibiotic as directed. We will await culture and sensitivity report and change antibiotic if necessary  3. Lower abdominal pain -This appears to be due to his chronic prostatitis. -Because of family history patient needs to get colonoscopy is been 10 years since the last.  4. MVP (mitral valve prolapse) -Take metoprolol 12.5 mg twice daily as needed for palpitations  5. Palpitations -Take metoprolol 12.5 mg twice daily as needed  Patient Instructions  The patient must consider getting his colonoscopy or at least visiting the gastroenterologist because of his daughters  history of rectal cancer Please return the FOBT Take the antibiotic twice daily with food Return to clinic in 4-6 weeks for recheck and prostate exam Continue to drink plenty of fluids and stay active Continue to watch caffeine intake Take metoprolol one half tablet twice daily or more often if needed for palpitations   Nyra Capes MD

## 2015-09-06 NOTE — Patient Instructions (Signed)
The patient must consider getting his colonoscopy or at least visiting the gastroenterologist because of his daughters history of rectal cancer Please return the FOBT Take the antibiotic twice daily with food Return to clinic in 4-6 weeks for recheck and prostate exam Continue to drink plenty of fluids and stay active Continue to watch caffeine intake Take metoprolol one half tablet twice daily or more often if needed for palpitations

## 2015-09-08 LAB — URINE CULTURE

## 2015-09-17 ENCOUNTER — Ambulatory Visit (INDEPENDENT_AMBULATORY_CARE_PROVIDER_SITE_OTHER): Payer: BC Managed Care – PPO | Admitting: Licensed Clinical Social Worker

## 2015-09-17 DIAGNOSIS — F419 Anxiety disorder, unspecified: Secondary | ICD-10-CM

## 2015-10-01 ENCOUNTER — Ambulatory Visit (INDEPENDENT_AMBULATORY_CARE_PROVIDER_SITE_OTHER): Payer: BC Managed Care – PPO | Admitting: Licensed Clinical Social Worker

## 2015-10-01 DIAGNOSIS — F419 Anxiety disorder, unspecified: Secondary | ICD-10-CM | POA: Diagnosis not present

## 2015-10-15 ENCOUNTER — Other Ambulatory Visit: Payer: Self-pay | Admitting: Cardiology

## 2015-10-15 NOTE — Telephone Encounter (Signed)
Rx refill sent to pharmacy. 

## 2015-10-18 ENCOUNTER — Ambulatory Visit: Payer: BC Managed Care – PPO | Admitting: Family Medicine

## 2015-10-24 ENCOUNTER — Ambulatory Visit (INDEPENDENT_AMBULATORY_CARE_PROVIDER_SITE_OTHER): Payer: BC Managed Care – PPO | Admitting: Family Medicine

## 2015-10-24 ENCOUNTER — Ambulatory Visit (INDEPENDENT_AMBULATORY_CARE_PROVIDER_SITE_OTHER): Payer: BC Managed Care – PPO

## 2015-10-24 ENCOUNTER — Encounter (INDEPENDENT_AMBULATORY_CARE_PROVIDER_SITE_OTHER): Payer: Self-pay

## 2015-10-24 ENCOUNTER — Encounter: Payer: Self-pay | Admitting: Family Medicine

## 2015-10-24 VITALS — BP 108/74 | HR 81 | Temp 97.1°F | Ht 76.0 in | Wt 186.0 lb

## 2015-10-24 DIAGNOSIS — N400 Enlarged prostate without lower urinary tract symptoms: Secondary | ICD-10-CM | POA: Diagnosis not present

## 2015-10-24 DIAGNOSIS — I341 Nonrheumatic mitral (valve) prolapse: Secondary | ICD-10-CM | POA: Diagnosis not present

## 2015-10-24 DIAGNOSIS — Z0000 Encounter for general adult medical examination without abnormal findings: Secondary | ICD-10-CM | POA: Diagnosis not present

## 2015-10-24 DIAGNOSIS — R1032 Left lower quadrant pain: Secondary | ICD-10-CM

## 2015-10-24 DIAGNOSIS — E785 Hyperlipidemia, unspecified: Secondary | ICD-10-CM | POA: Diagnosis not present

## 2015-10-24 DIAGNOSIS — N419 Inflammatory disease of prostate, unspecified: Secondary | ICD-10-CM | POA: Diagnosis not present

## 2015-10-24 DIAGNOSIS — M25552 Pain in left hip: Secondary | ICD-10-CM

## 2015-10-24 LAB — URINALYSIS, COMPLETE
BILIRUBIN UA: NEGATIVE
GLUCOSE, UA: NEGATIVE
Ketones, UA: NEGATIVE
Leukocytes, UA: NEGATIVE
NITRITE UA: NEGATIVE
PH UA: 6.5 (ref 5.0–7.5)
PROTEIN UA: NEGATIVE
RBC UA: NEGATIVE
Specific Gravity, UA: 1.02 (ref 1.005–1.030)
UUROB: 0.2 mg/dL (ref 0.2–1.0)

## 2015-10-24 LAB — MICROSCOPIC EXAMINATION
BACTERIA UA: NONE SEEN
EPITHELIAL CELLS (NON RENAL): NONE SEEN /HPF (ref 0–10)
RBC, UA: NONE SEEN /hpf (ref 0–?)

## 2015-10-24 LAB — URINALYSIS, MICROSCOPIC ONLY
BACTERIA UA: NONE SEEN
EPITHELIAL CELLS (NON RENAL): NONE SEEN /HPF (ref 0–10)
RBC, UA: NONE SEEN /hpf (ref 0–?)

## 2015-10-24 NOTE — Progress Notes (Signed)
Subjective:    Patient ID: Charles Rasmussen, male    DOB: [birth year], 45 y.o.   MRN: [id number]  HPI Patient here today for 6 week follow up on prostatitis. He is still having some left hip pressure. The wet prep following the prostate exam over 4 weeks ago showed 8200 WBC. The culture that grew out did not grow any bacteria of any significance. The patient has been taking doxycycline for infection and he is still having some left hip pressure. We will recheck a urine today.The patient is still having some left anterior pelvic pain along the iliac crest area. There is soreness and not necessarily pain. He is voiding okay and cannot see any difference with his voiding since taken 4 weeks worth of doxycycline. He finished the doxycycline a couple weeks ago. The left iliac pelvic pain continues.     Patient Active Problem List   Diagnosis Date Noted  . Palpitations 01/10/2015  . 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 PVC (premature ventricular contraction) 02/19/2014  . Pectus excavatum 11/07/2013  . Thrombosed external hemorrhoid 12/21/2012  . Chronic prostatitis 10/28/2012  . Hyperlipidemia 10/28/2012  . MVP (mitral valve prolapse) 10/28/2012  . Varicose veins of lower extremities with other complications 19/41/7408   Outpatient Encounter Prescriptions as of 10/24/2015  Medication Sig  . metoprolol tartrate (LOPRESSOR) 25 MG tablet TAKE 0.5 TABLETS (12.5 MG TOTAL) BY MOUTH 2 (TWO) TIMES DAILY AS NEEDED.  . [DISCONTINUED] doxycycline (VIBRA-TABS) 100 MG tablet Take 1 tablet (100 mg total) by mouth 2 (two) times daily. 1 po bid   No facility-administered encounter medications on file as of 10/24/2015.      Review of Systems  Constitutional: Negative.   HENT: Negative.   Eyes: Negative.   Respiratory: Negative.   Cardiovascular: Negative.   Gastrointestinal: Negative.   Endocrine: Negative.   Genitourinary: Negative.        Left hip pressure  Musculoskeletal: Negative.   Skin:  Negative.   Allergic/Immunologic: Negative.   Neurological: Negative.   Hematological: Negative.   Psychiatric/Behavioral: Negative.        Objective:   Physical Exam  Constitutional: He is oriented to person, place, and time. He appears well-developed and well-nourished.  Abdominal: Soft. Bowel sounds are normal. There is no tenderness. There is no rebound and no guarding.  There is no tenderness over the left anterior pelvic iliac crest area.  Genitourinary: Rectum normal and penis normal.  The prostate is slightly enlarged and once again there are no inguinal hernias or inguinal nodes. There is no rectal abnormalities.  Wet prep was examined and this time there were only 10-15 WBC compared to 80-100 WBC at the last wet prep  Musculoskeletal: Normal range of motion. He exhibits no tenderness.  Neurological: He is alert and oriented to person, place, and time.  Skin: Skin is warm and dry. No rash noted.  Psychiatric: He has a normal mood and affect. His behavior is normal. Thought content normal.  Nursing note and vitals reviewed.  BP 108/74 mmHg  Pulse 81  Temp(Src) 97.1 F (36.2 C) (Oral)  Ht 6' 4" (1.93 m)  Wt 186 lb (84.369 kg)  BMI 22.65 kg/m2        Assessment & Plan:  1. Prostatitis, unspecified prostatitis type -Per wet prep compared to the previous wet prep this has improved - Urinalysis, Complete - Urine culture - CBC with Differential/Platelet; Future - Urine Microscopic  2. Hyperlipidemia -Future lab work to reassess his cholesterol - CBC  with Differential/Platelet; Future - BMP8+EGFR; Future - Hepatic function panel; Future - NMR, lipoprofile; Future  3. Prostate enlargement -The prostate remains slightly enlarged without significant tenderness -The patient will take 2 more weeks worth of doxycycline since there was an improvement in the number of pus cells on the wet prep this time compared to previously. - CBC with Differential/Platelet; Future -  PSA, total and free; Future  4. MVP (mitral valve prolapse) - CBC with Differential/Platelet; Future  5. Health care maintenance - CBC with Differential/Platelet; Future - BMP8+EGFR; Future - Hepatic function panel; Future - PSA, total and free; Future - NMR, lipoprofile; Future - VITAMIN D 25 Hydroxy (Vit-D Deficiency, Fractures); Future - Fecal occult blood, imunochemical  6. Left hip pain - US Pelvis Complete; Future - US Abdomen Complete; Future - DG HIP UNILAT W OR W/O PELVIS 2-3 VIEWS LEFT; Future -He will take ibuprofen 200 mg twice daily for 7-10 days after eating. -He will call us in 4 weeks and let us know how the pelvic side pain is doing at that time  7. Left lower quadrant pain - US Pelvis Complete; Future - US Abdomen Complete; Future - DG HIP UNILAT W OR W/O PELVIS 2-3 VIEWS LEFT; Future  No orders of the defined types were placed in this encounter.   Patient Instructions  The patient should take the antibiotic for 2 more weeks and then hold anything remaining antibiotic He should try taking ibuprofen 200 mg twice daily after breakfast and supper for 5-7 days to see if this helps some of the pelvic soreness. We will get x-rays of the pelvis and an ultrasound of the abdomen and pelvis further evaluation and call him with these results He should call us in about 4 weeks and let us know how the left pelvic pain is doing at that time   Arrie Senate MD

## 2015-10-24 NOTE — Patient Instructions (Signed)
The patient should take the antibiotic for 2 more weeks and then hold anything remaining antibiotic He should try taking ibuprofen 200 mg twice daily after breakfast and supper for 5-7 days to see if this helps some of the pelvic soreness. We will get x-rays of the pelvis and an ultrasound of the abdomen and pelvis further evaluation and call him with these results He should call us in about 4 weeks and let us know how the left pelvic pain is doing at that time

## 2015-10-25 ENCOUNTER — Ambulatory Visit (HOSPITAL_COMMUNITY): Admission: RE | Admit: 2015-10-25 | Payer: BC Managed Care – PPO | Source: Ambulatory Visit

## 2015-10-25 ENCOUNTER — Ambulatory Visit (HOSPITAL_COMMUNITY): Payer: BC Managed Care – PPO

## 2015-10-25 LAB — URINE CULTURE: Organism ID, Bacteria: NO GROWTH

## 2015-10-26 ENCOUNTER — Ambulatory Visit (HOSPITAL_COMMUNITY)
Admission: RE | Admit: 2015-10-26 | Discharge: 2015-10-26 | Disposition: A | Discharge status: Home / Self Care | Payer: BC Managed Care – PPO | Source: Ambulatory Visit | Attending: Family Medicine | Admitting: Family Medicine

## 2015-10-26 ENCOUNTER — Other Ambulatory Visit: Payer: Self-pay | Admitting: Family Medicine

## 2015-10-26 DIAGNOSIS — R1032 Left lower quadrant pain: Secondary | ICD-10-CM | POA: Insufficient documentation

## 2015-10-26 DIAGNOSIS — M25552 Pain in left hip: Secondary | ICD-10-CM | POA: Insufficient documentation

## 2015-10-29 ENCOUNTER — Encounter: Payer: Self-pay | Admitting: Internal Medicine

## 2015-10-29 ENCOUNTER — Ambulatory Visit (INDEPENDENT_AMBULATORY_CARE_PROVIDER_SITE_OTHER): Payer: BC Managed Care – PPO | Admitting: Internal Medicine

## 2015-10-29 VITALS — BP 116/80 | HR 80 | Ht 76.0 in | Wt 185.8 lb

## 2015-10-29 DIAGNOSIS — I493 Ventricular premature depolarization: Secondary | ICD-10-CM

## 2015-10-29 DIAGNOSIS — R002 Palpitations: Secondary | ICD-10-CM

## 2015-10-29 LAB — FECAL OCCULT BLOOD, IMMUNOCHEMICAL: FECAL OCCULT BLD: NEGATIVE

## 2015-10-29 NOTE — Patient Instructions (Signed)
Medication Instructions: - Your physician recommends that you continue on your current medications as directed. Please refer to the Current Medication list given to you today.  Labwork: - none  Procedures/Testing: - none  Follow-Up: - Your physician wants you to follow-up in: 6 months with Dr. Klein. You will receive a reminder letter in the mail two months in advance. If you don't receive a letter, please call our office to schedule the follow-up appointment.  Any Additional Special Instructions Will Be Listed Below (If Applicable).     If you need a refill on your cardiac medications before your next appointment, please call your pharmacy.   

## 2015-10-29 NOTE — Progress Notes (Signed)
      Patient Care Team: Ernestina Pennaonald W [name], MD as PCP - General (Family Medicine) Laurey Moralealton S McLean, MD (Cardiology) Arminda Residesaniel Jones, MD (Dermatology) Bedelia Personarroll F. Kennith CenterHines, MD (Ophthalmology)   HPI  Charles Rasmussen is a 45 y.o. male Seen in follow-up for palpitations. These include PVCs.  Event recording was utilized to define mechanisms of palpitations thought to be different from PVCs. Only PVCs were identified; however, he didn't wear the monitor heart rate at all.  Metoprolol daily and occasionally DIC have helped attenuate significantly a palpitations.  Also he is addressing his anxiety   Past Medical History  Diagnosis Date  . Mitral valve prolapse   . Venous reflux 01/02/12    deep right per Quita SkyeJames D. Hart RochesterLawson, M.D.  . Unspecified hemorrhoids without mention of complication 2006  . Hemorrhoid thrombosis     Past Surgical History  Procedure Laterality Date  . Neg hx    . Mouth surgery      wisdom teeth    Current Outpatient Prescriptions  Medication Sig Dispense Refill  . metoprolol tartrate (LOPRESSOR) 25 MG tablet Take 12.5 mg by mouth 2 (two) times daily as needed (skipped heart beats).     No current facility-administered medications for this visit.    No Known Allergies    Review of Systems negative except from HPI and PMH  Physical Exam BP 116/80 mmHg  Pulse 80  Ht 6\' 4"  (1.93 m)  Wt 185 lb 12.8 oz (84.278 kg)  BMI 22.63 kg/m2 Well developed and well nourished in no acute distress HENT normal E scleral and icterus clear Neck Supple JVP flat; carotids brisk and full Clear to ausculation  Regular rate and rhythm, no murmurs gallops or rub Soft with active bowel sounds No clubbing cyanosis  Edema Alert and oriented, grossly normal motor and sensory function Skin Warm and Dry  ECG 12/16 demonstrated sinus rhythm at 84 Intervals 16/09/34  Assessment and  Plan  Palpitations-PVCs  Anxiety   Overall he is doing better with addressing his anxiety.  He  has a great deal of concern about his PVCs and other palpitations.  We spent more than 50% of our >25 min visit in face to face counseling regarding the above

## 2015-11-08 ENCOUNTER — Other Ambulatory Visit: Payer: Self-pay | Admitting: Family Medicine

## 2015-11-19 ENCOUNTER — Encounter: Payer: Self-pay | Admitting: *Deleted

## 2016-01-01 ENCOUNTER — Ambulatory Visit (INDEPENDENT_AMBULATORY_CARE_PROVIDER_SITE_OTHER): Payer: BC Managed Care – PPO | Admitting: Licensed Clinical Social Worker

## 2016-01-01 DIAGNOSIS — F419 Anxiety disorder, unspecified: Secondary | ICD-10-CM

## 2016-02-01 ENCOUNTER — Ambulatory Visit (INDEPENDENT_AMBULATORY_CARE_PROVIDER_SITE_OTHER): Payer: BC Managed Care – PPO | Admitting: Licensed Clinical Social Worker

## 2016-02-01 DIAGNOSIS — F419 Anxiety disorder, unspecified: Secondary | ICD-10-CM | POA: Diagnosis not present

## 2016-02-05 ENCOUNTER — Ambulatory Visit: Payer: BC Managed Care – PPO | Admitting: Family Medicine

## 2016-02-06 ENCOUNTER — Encounter: Payer: Self-pay | Admitting: Family Medicine

## 2016-02-08 ENCOUNTER — Ambulatory Visit: Payer: BC Managed Care – PPO | Admitting: Family Medicine

## 2016-02-29 ENCOUNTER — Ambulatory Visit (INDEPENDENT_AMBULATORY_CARE_PROVIDER_SITE_OTHER): Payer: BC Managed Care – PPO | Admitting: Internal Medicine

## 2016-02-29 ENCOUNTER — Ambulatory Visit: Payer: Self-pay | Admitting: Internal Medicine

## 2016-02-29 ENCOUNTER — Encounter: Payer: Self-pay | Admitting: Internal Medicine

## 2016-02-29 VITALS — BP 120/70 | HR 76 | Ht 76.0 in | Wt 190.2 lb

## 2016-02-29 DIAGNOSIS — R002 Palpitations: Secondary | ICD-10-CM

## 2016-02-29 DIAGNOSIS — I493 Ventricular premature depolarization: Secondary | ICD-10-CM | POA: Diagnosis not present

## 2016-02-29 NOTE — Progress Notes (Signed)
Patient Care Team: Ernestina Pennaonald W [name], MD as PCP - General (Family Medicine) Laurey Moralealton S McLean, MD (Cardiology) Arminda Residesaniel Jones, MD (Dermatology) Bedelia Personarroll F. Kennith CenterHines, MD (Ophthalmology)   HPI  Charles Rasmussen is a 45 y.o. male Seen in follow-up for palpitations. These include PVCs.  Event recording was utilized to define mechanisms of palpitations thought to be different from PVCs. Only PVCs were identified; however, he didn't wear the monitor heart rate at all.  Metoprolol daily and occasionally DIC have helped attenuate significantly a palpitations.  Also he is addressing his anxiety   Past Medical History:  Diagnosis Date  . Hemorrhoid thrombosis   . Mitral valve prolapse   . Unspecified hemorrhoids without mention of complication 2006  . Venous reflux 01/02/12   deep right per Quita SkyeJames D. Hart RochesterLawson, M.D.    Past Surgical History:  Procedure Laterality Date  . MOUTH SURGERY     wisdom teeth  . neg hx      Current Outpatient Prescriptions  Medication Sig Dispense Refill  . doxycycline (VIBRA-TABS) 100 MG tablet TAKE 1 TABLET (100 MG TOTAL) BY MOUTH 2 (TWO) TIMES DAILY. 60 tablet 0  . metoprolol tartrate (LOPRESSOR) 25 MG tablet Take 12.5 mg by mouth 2 (two) times daily as needed (skipped heart beats).     No current facility-administered medications for this visit.     No Known Allergies    Review of Systems negative except from HPI and PMH  Physical Exam There were no vitals taken for this visit. Well developed and well nourished in no acute distress HENT normal E scleral and icterus clear Neck Supple JVP flat; carotids brisk and full Clear to ausculation  Regular rate and rhythm, no murmurs gallops or rub Soft with active bowel sounds No clubbing cyanosis  Edema Alert and oriented, grossly normal motor and sensory function Skin Warm and Dry  ECG 12/16 demonstrated sinus rhythm at 84 Intervals 16/09/34  Assessment and  Plan  Palpitations-PVCs  Anxiety             Patient Care Team: Ernestina Pennaonald W [name], MD as PCP - General (Family Medicine) Laurey Moralealton S McLean, MD (Cardiology) Arminda Residesaniel Jones, MD (Dermatology) Bedelia Personarroll F. Kennith CenterHines, MD (Ophthalmology)   HPI  Charles Rasmussen is a 45 y.o. male Seen in follow-up for palpitations. These include PVCs.  Event recording was utilized to define mechanisms of palpitations thought to be different from PVCs. Only PVCs were identified; however, he didn't wear the monitor heart rate at all.  Metoprolol daily and occasionally DIC have helped attenuate significantly a palpitations.  Also he is addressing his anxiety   Past Medical History:  Diagnosis Date  . Hemorrhoid thrombosis   . Mitral valve prolapse   . Unspecified hemorrhoids without mention of complication 2006  . Venous reflux 01/02/12   deep right per Quita SkyeJames D. Hart RochesterLawson, M.D.    Past Surgical History:  Procedure Laterality Date  . MOUTH SURGERY     wisdom teeth  . neg hx      Current Outpatient Prescriptions  Medication Sig Dispense Refill  . doxycycline (VIBRA-TABS) 100 MG tablet TAKE 1 TABLET (100 MG TOTAL) BY MOUTH 2 (TWO) TIMES DAILY. 60 tablet 0  . metoprolol tartrate (LOPRESSOR) 25 MG tablet Take 12.5 mg by mouth 2 (two) times daily as needed (skipped heart beats).     No current facility-administered medications for this visit.     No Known Allergies    Review of Systems negative except  from HPI and PMH  Physical Exam BP 120/70   Pulse 76   Ht 6\' 4"  (1.93 m)   Wt 190 lb 3.2 oz (86.3 kg)   BMI 23.15 kg/m  Well developed and well nourished in no acute distress HENT normal E scleral and icterus clear Neck Supple JVP flat; carotids brisk and full Clear to ausculation  Regular rate and rhythm, no murmurs gallops or rub Soft with active bowel sounds No clubbing cyanosis  Edema Alert and oriented, grossly normal motor and sensory function Skin Warm and Dry  ECG Sinus at 76 Intervals 16/08/36 Axis XCI  RSR prime Intervals  16/09/34  Assessment and  Plan  Palpitations-PVCs  Anxiety   Overall he is doing better with addressing his anxiety.he has been doing counseling  He has a great deal of concern about his PVCs and other palpitations.  We spent more than 50% of our >25 min visit in face to face counseling regarding the above

## 2016-02-29 NOTE — Patient Instructions (Signed)
Medication Instructions: Your physician recommends that you continue on your current medications as directed. Please refer to the Current Medication list given to you today.   Labwork: None   Procedures/Testing: None  Follow-Up: Your physician wants you to follow-up in: 1 YEAR with Dr. Klein. You will receive a reminder letter in the mail two months in advance. If you don't receive a letter, please call our office to schedule the follow-up appointment.   Any Additional Special Instructions Will Be Listed Below (If Applicable).     If you need a refill on your cardiac medications before your next appointment, please call your pharmacy.   

## 2016-03-14 ENCOUNTER — Telehealth: Payer: Self-pay | Admitting: Family Medicine

## 2016-03-19 MED ORDER — DOXYCYCLINE HYCLATE 100 MG PO TABS: ORAL_TABLET | ORAL | 1 refills | Status: DC

## 2016-03-19 NOTE — Telephone Encounter (Signed)
Patient has not been seen since April and states he missed apt with [facility]. He would like to know if something can be called in or does patient need to be seen? Please advise

## 2016-03-19 NOTE — Telephone Encounter (Signed)
lmtcb

## 2016-03-19 NOTE — Telephone Encounter (Signed)
Discussed with patient. Antibiotics seems to work better for him than the other. It seemed to Septra DS 1 twice daily with food for 30 days with 1 refill or doxycycline 100 mg twice daily with food for 30 days with 1 refill. The patient should make an appointment to be seen in 4 weeks for follow-up

## 2016-03-19 NOTE — Telephone Encounter (Signed)
Dr Christell ConstantMoore, can we call in something for this pt??

## 2016-03-21 ENCOUNTER — Telehealth: Payer: Self-pay | Admitting: Family Medicine

## 2016-03-21 NOTE — Telephone Encounter (Signed)
appt discussed  

## 2016-03-24 ENCOUNTER — Ambulatory Visit: Payer: BC Managed Care – PPO | Admitting: Licensed Clinical Social Worker

## 2016-03-25 ENCOUNTER — Other Ambulatory Visit: Payer: Self-pay | Admitting: *Deleted

## 2016-03-25 MED ORDER — DOXYCYCLINE HYCLATE 100 MG PO TABS: ORAL_TABLET | ORAL | 0 refills | Status: DC

## 2016-04-17 ENCOUNTER — Ambulatory Visit: Payer: BC Managed Care – PPO | Admitting: Family Medicine

## 2016-05-07 ENCOUNTER — Ambulatory Visit (INDEPENDENT_AMBULATORY_CARE_PROVIDER_SITE_OTHER): Payer: BC Managed Care – PPO | Admitting: Licensed Clinical Social Worker

## 2016-05-07 DIAGNOSIS — F419 Anxiety disorder, unspecified: Secondary | ICD-10-CM | POA: Diagnosis not present

## 2016-05-09 IMAGING — US US ABDOMEN COMPLETE
1 series · 14 of 25 positions shown · non-contrast
Comparison: None in PACs

CLINICAL DATA: Left upper and lower quadrant and left hip region
discomfort for the past 6 weeks; history of hyperlipidemia and
prostatitis

EXAM:
ABDOMEN ULTRASOUND COMPLETE

[Series 1: us abdomen complete · 0.20mm/px · 14 of 114 slices shown]
[im 1/114]
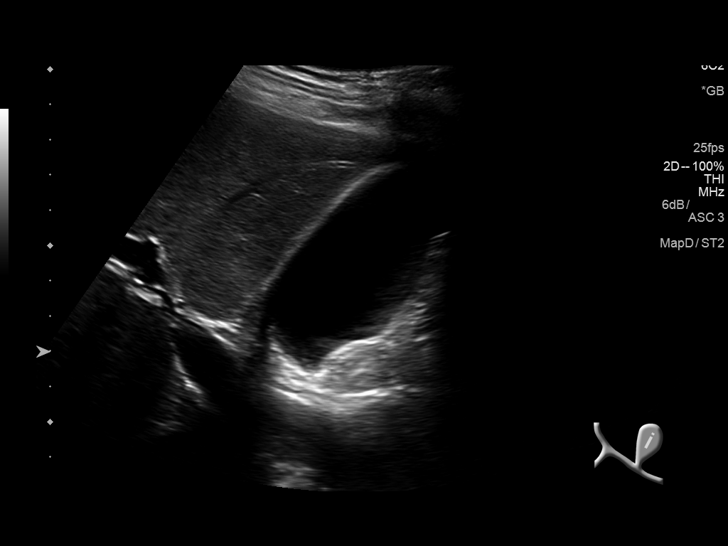
[im 10/114]
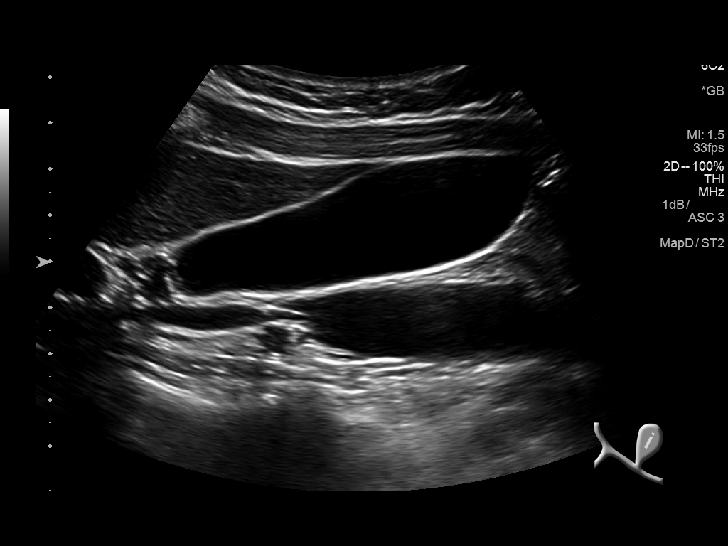
[im 19/114]
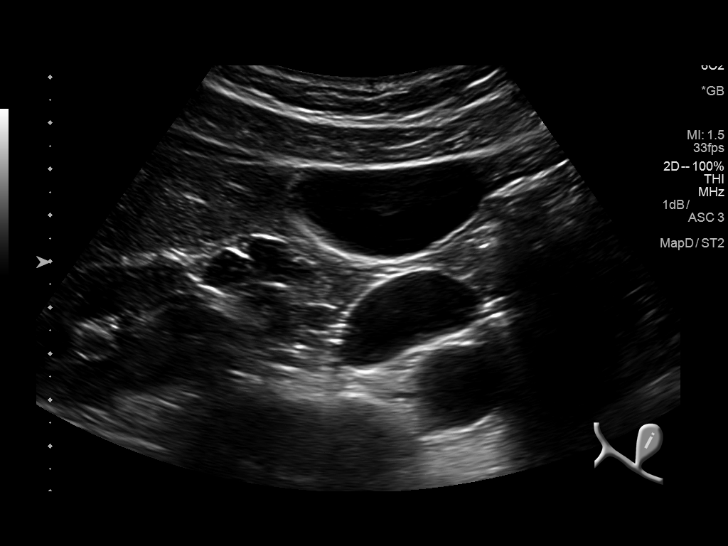
[im 29/114]
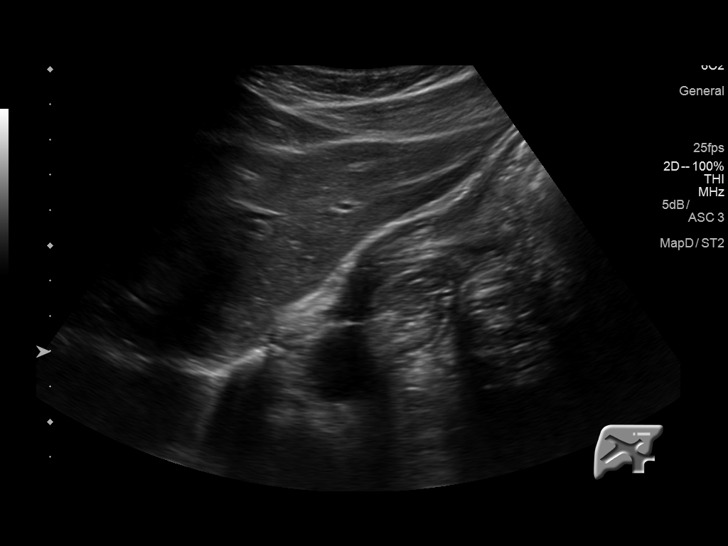
[im 38/114]
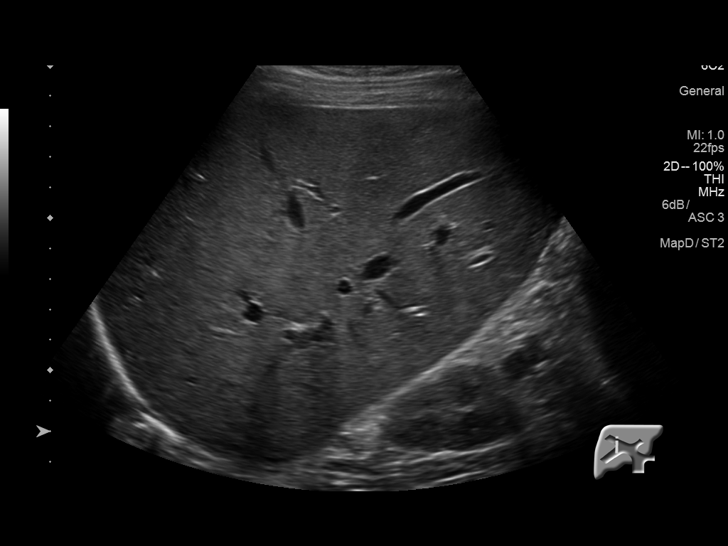
[im 43/114]
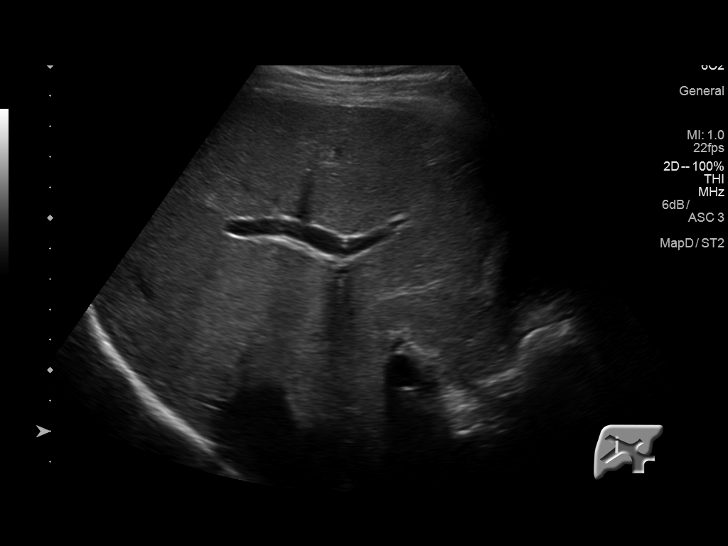
[im 52/114]
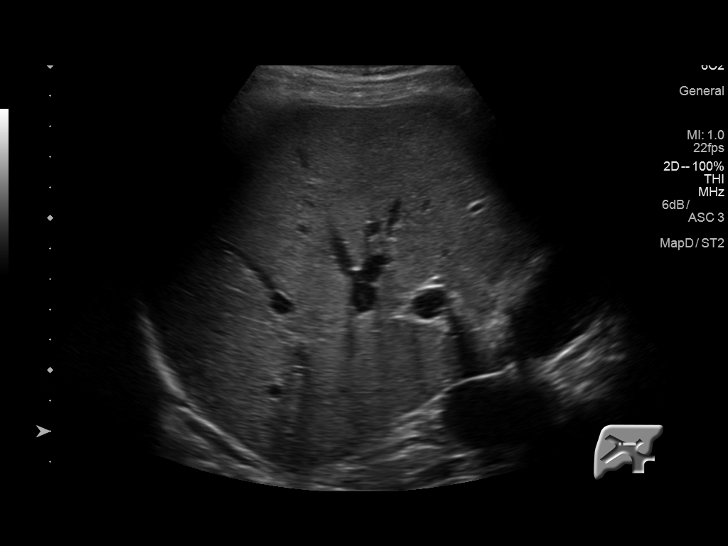
[im 62/114]
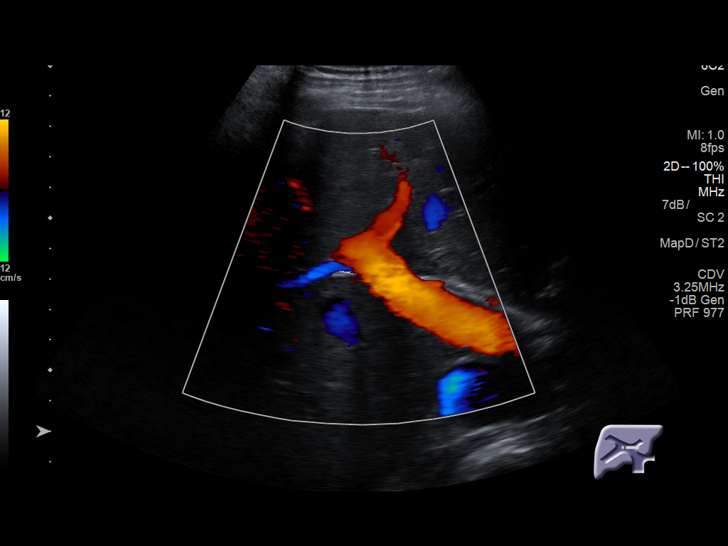
[im 71/114]
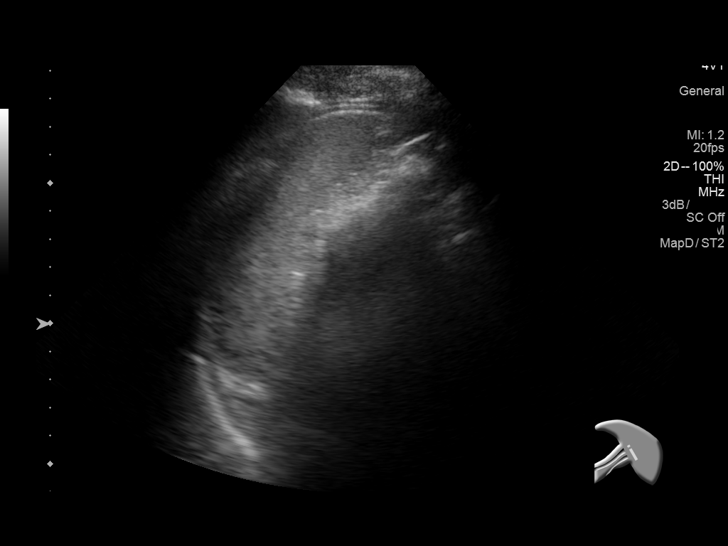
[im 76/114]
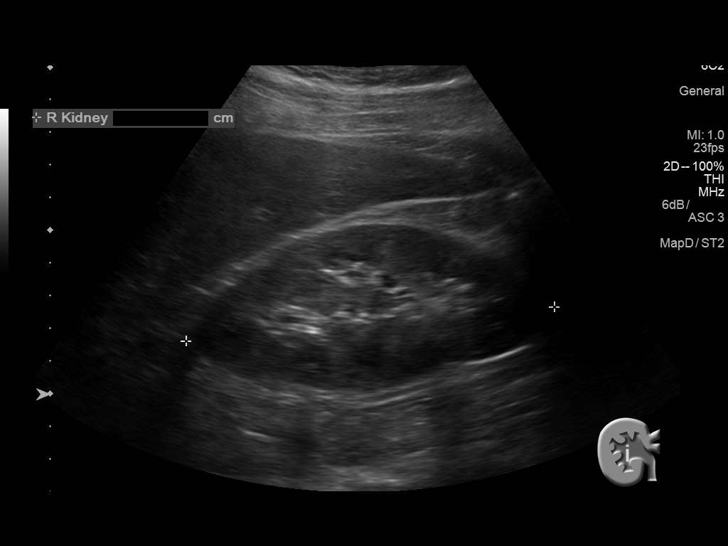
[im 85/114]
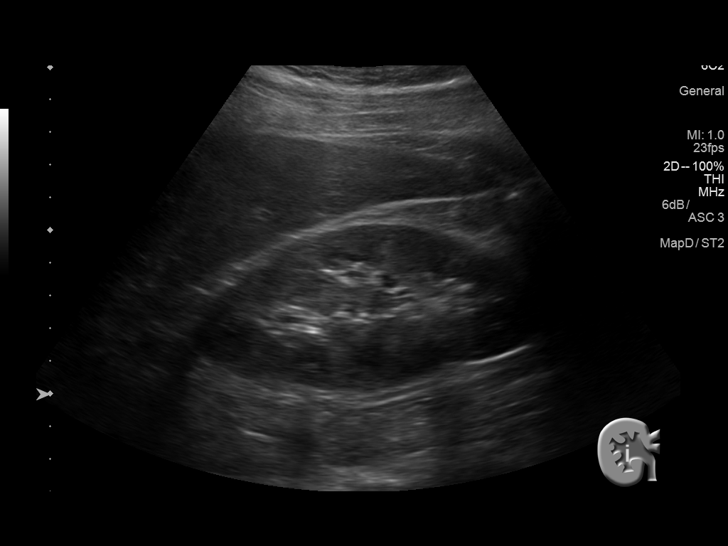
[im 95/114]
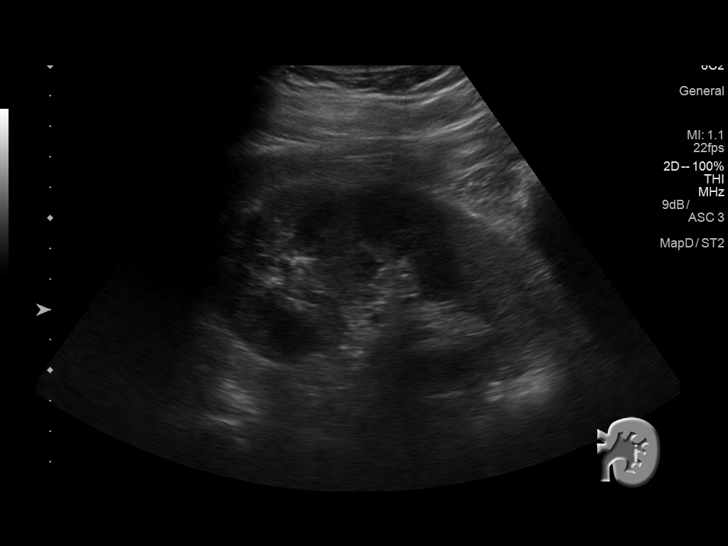
[im 104/114]
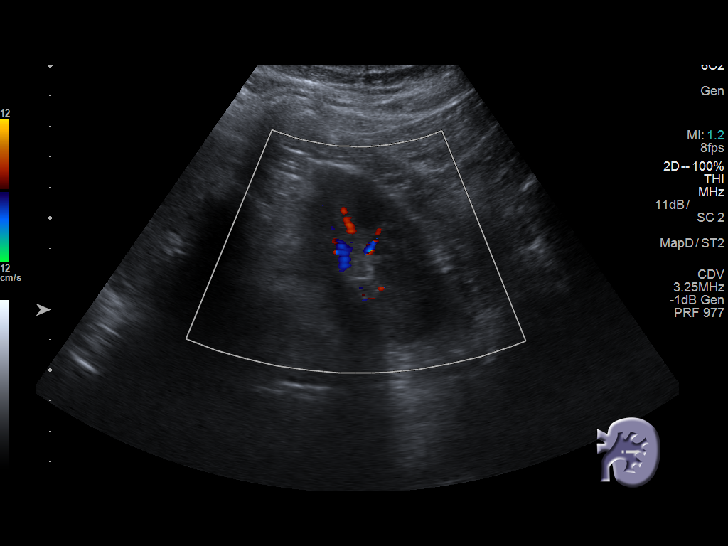
[im 114/114]
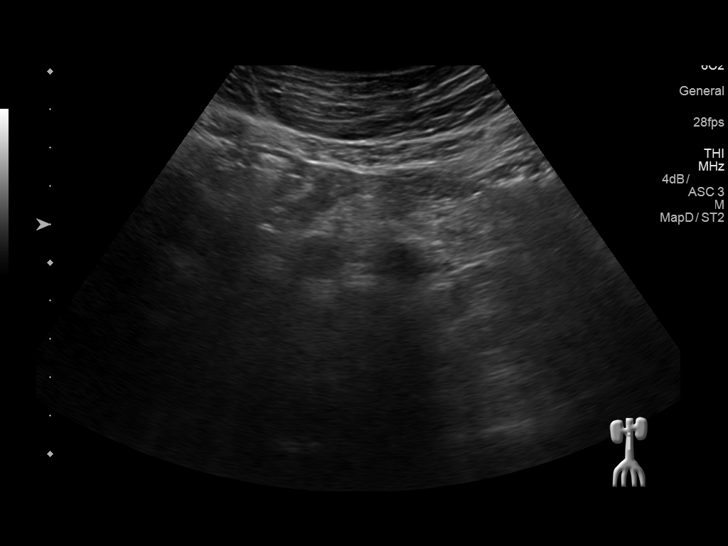

[14 of 25 positions shown; findings below may reference images not displayed]

FINDINGS: Gallbladder: No gallstones or wall thickening visualized. No
sonographic Murphy sign noted by sonographer.

Common bile duct: Diameter: 2.1 mm

Liver: No focal lesion identified. Within normal limits in
parenchymal echogenicity.

IVC: No abnormality visualized.

Pancreas: Visualized portion unremarkable.

Spleen: Size and appearance within normal limits.

Right Kidney: Length: 11.3 cm. Echogenicity within normal limits. No
mass or hydronephrosis visualized.

Left Kidney: Length: 12.3 cm. Echogenicity within normal limits. No
mass or hydronephrosis visualized.

Abdominal aorta: No aneurysm visualized.

Other findings: There is no ascites.
IMPRESSION: 1. No evidence of gallstones nor other acute hepatobiliary
abnormality.
2. No hydronephrosis nor other renal abnormality.

## 2016-06-24 ENCOUNTER — Ambulatory Visit: Payer: BC Managed Care – PPO | Admitting: Licensed Clinical Social Worker

## 2016-06-25 ENCOUNTER — Ambulatory Visit (INDEPENDENT_AMBULATORY_CARE_PROVIDER_SITE_OTHER): Payer: BC Managed Care – PPO | Admitting: Family Medicine

## 2016-06-25 ENCOUNTER — Encounter: Payer: Self-pay | Admitting: Family Medicine

## 2016-06-25 ENCOUNTER — Ambulatory Visit (INDEPENDENT_AMBULATORY_CARE_PROVIDER_SITE_OTHER): Payer: BC Managed Care – PPO

## 2016-06-25 VITALS — BP 115/78 | HR 76 | Temp 97.2°F | Ht 76.0 in | Wt 196.0 lb

## 2016-06-25 DIAGNOSIS — B079 Viral wart, unspecified: Secondary | ICD-10-CM

## 2016-06-25 DIAGNOSIS — N411 Chronic prostatitis: Secondary | ICD-10-CM | POA: Diagnosis not present

## 2016-06-25 DIAGNOSIS — I493 Ventricular premature depolarization: Secondary | ICD-10-CM | POA: Diagnosis not present

## 2016-06-25 DIAGNOSIS — E7800 Pure hypercholesterolemia, unspecified: Secondary | ICD-10-CM | POA: Diagnosis not present

## 2016-06-25 DIAGNOSIS — Z800 Family history of malignant neoplasm of digestive organs: Secondary | ICD-10-CM | POA: Diagnosis not present

## 2016-06-25 DIAGNOSIS — N400 Enlarged prostate without lower urinary tract symptoms: Secondary | ICD-10-CM | POA: Diagnosis not present

## 2016-06-25 DIAGNOSIS — R002 Palpitations: Secondary | ICD-10-CM | POA: Diagnosis not present

## 2016-06-25 DIAGNOSIS — I341 Nonrheumatic mitral (valve) prolapse: Secondary | ICD-10-CM | POA: Diagnosis not present

## 2016-06-25 DIAGNOSIS — Z23 Encounter for immunization: Secondary | ICD-10-CM

## 2016-06-25 DIAGNOSIS — E559 Vitamin D deficiency, unspecified: Secondary | ICD-10-CM | POA: Diagnosis not present

## 2016-06-25 LAB — URINALYSIS
Bilirubin, UA: NEGATIVE
GLUCOSE, UA: NEGATIVE
Ketones, UA: NEGATIVE
Leukocytes, UA: NEGATIVE
Nitrite, UA: NEGATIVE
PH UA: 7 (ref 5.0–7.5)
Protein, UA: NEGATIVE
Specific Gravity, UA: 1.02 (ref 1.005–1.030)
UUROB: 0.2 mg/dL (ref 0.2–1.0)

## 2016-06-25 MED ORDER — DOXYCYCLINE HYCLATE 100 MG PO TABS: ORAL_TABLET | ORAL | 1 refills | Status: DC

## 2016-06-25 NOTE — Progress Notes (Signed)
Subjective:    Patient ID: Charles Rasmussen, male    DOB: Jan 20, 1971, 45 y.o.   MRN: [id number]  HPI  Pt here for follow up and management of chronic medical problems which includes hyperlipidemia. He is taking medications regularly.The patient saw the cardiologist back in August. He is reassured the patient about his PVCs. He has a return appointment in one year. The patient has a history of pressure and chronic prostatitis. He still has occasional pressure in the suprapubic area. He is due to get his flu shot today. We will check a urine. His weight is up about 6 pounds from the previous visit. The patient seems calmer today. He did get some counseling about some of his anxieties and he feels this has helped him a lot. He denies any chest pain pressure or any more palpitations than usual. He is been active physically and lifting weights. He denies any shortness of breath. He's passing his water without any problems currently. He still worries about the prostate and likes to think that some day there would not be any infection cells present. He does have some hemorrhoid issues and this seems to be under good control presently. His father had rectal cancer. The patient's last colonoscopy was in 2006. He's had negative fecal occult blood test cards. We will most likely plan on getting his next colonoscopy when he turns 48 unless he has any problems sooner.    Patient Active Problem List   Diagnosis Date Noted  . Palpitations 01/10/2015  . 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 PVC (premature ventricular contraction) 02/19/2014  . Pectus excavatum 11/07/2013  . Thrombosed external hemorrhoid 12/21/2012  . Chronic prostatitis 10/28/2012  . Hyperlipidemia 10/28/2012  . MVP (mitral valve prolapse) 10/28/2012  . Varicose veins of lower extremities with other complications 29/93/7169   Outpatient Encounter Prescriptions as of 06/25/2016  Medication Sig  . metoprolol tartrate (LOPRESSOR) 25 MG tablet Take  12.5 mg by mouth 2 (two) times daily as needed (skipped heart beats).  . [DISCONTINUED] doxycycline (VIBRA-TABS) 100 MG tablet TAKE 1 TABLET (100 MG TOTAL) BY MOUTH 2 (TWO) TIMES DAILY.   No facility-administered encounter medications on file as of 06/25/2016.      Review of Systems  Constitutional: Negative.   HENT: Negative.   Eyes: Negative.   Respiratory: Negative.   Cardiovascular: Negative.   Gastrointestinal: Negative.   Endocrine: Negative.   Genitourinary: Negative.        Lower hip pressure  Musculoskeletal: Negative.   Skin: Negative.   Allergic/Immunologic: Negative.   Neurological: Negative.   Hematological: Negative.   Psychiatric/Behavioral: Negative.        Objective:   Physical Exam  Constitutional: He is oriented to person, place, and time. He appears well-developed and well-nourished. No distress.  Patient is pleasant and alert  HENT:  Head: Normocephalic and atraumatic.  Right Ear: External ear normal.  Mouth/Throat: Oropharynx is clear and moist. No oropharyngeal exudate.  Ears cerumen left ear canal Slight nasal congestion and turbinate swelling bilaterally  Eyes: Conjunctivae and EOM are normal. Pupils are equal, round, and reactive to light. Right eye exhibits no discharge. Left eye exhibits no discharge. No scleral icterus.  Neck: Normal range of motion. Neck supple. No thyromegaly present.  Cardiovascular: Normal rate, normal heart sounds and intact distal pulses.   No murmur heard. As an occasional skipped with a rate of about 72/m  Pulmonary/Chest: Effort normal and breath sounds normal. No respiratory distress. He has no wheezes. He  has no rales.  Clear anteriorly and posteriorly  Abdominal: Soft. Bowel sounds are normal. He exhibits no mass. There is no tenderness. There is no rebound and no guarding.  No liver or spleen enlargement no inguinal adenopathy no abdominal tenderness  Genitourinary: Rectum normal and penis normal.  Genitourinary  Comments: Minimal enlargement no lumps. Rectum was clear of any masses. There is no inguinal hernias present and the external genitalia were within normal limits. Testicles were normal. Prostate was slightly tender to palpation.  Musculoskeletal: Normal range of motion. He exhibits no edema.  Lymphadenopathy:    He has no cervical adenopathy.  Neurological: He is alert and oriented to person, place, and time. He has normal reflexes. No cranial nerve deficit.  Skin: Skin is warm and dry. No rash noted.  Verruca of right neck and right dorsal hand. Cryotherapy performed on both without problems.  Psychiatric: He has a normal mood and affect. His behavior is normal. Judgment and thought content normal.  Nursing note and vitals reviewed.  BP 115/78 (BP Location: Left Arm)   Pulse 76   Temp 97.2 F (36.2 C) (Oral)   Ht '6\' 4"'  (1.93 m)   Wt 196 lb (88.9 kg)   BMI 23.86 kg/m   Cryotherapy on 2 warts today one on the right dorsal hand and one on the right neck.      Assessment & Plan:  1. Pure hypercholesterolemia -Continue with aggressive therapeutic lifestyle changes - CBC with Differential/Platelet - BMP8+EGFR - Hepatic function panel - Lipid panel - DG Chest 2 View; Future  2. Prostate enlargement -The prostate is only minimally enlarged and there is no lumps or masses. The patient has a history of chronic prostatitis and he is always worried about this. - Urine culture - CBC with Differential/Platelet - Urinalysis  3. MVP (mitral valve prolapse) -The patient has history of mitral valve prolapse but also has occasional heart irregularity problems. He seems to be more confident about this and reassured by the cardiologist currently. His heart was slightly irregular today. - CBC with Differential/Platelet  4. Vitamin D deficiency -Isac Caddy current treatment pending results of lab work - VITAMIN D 25 Hydroxy (Vit-D Deficiency, Fractures)  5. Chronic prostatitis -Check urinalysis  before prostate exam and after prostate exam  6. Palpitations -Heart was slightly irregular today on examination with a rate of about 72/m  7. PVC (premature ventricular contraction) -More irregularity then PVCs.  8. Family history of rectal cancer -We will talk with the gastroenterologist regarding the patient's need for colonoscopy now or when he turns 56 with a positive family history of rectal cancer.  9. Viral warts, unspecified type -Cryotherapy to wart on right neck and right dorsal hand  No orders of the defined types were placed in this encounter.  Patient Instructions   Continue current medications. Continue good therapeutic lifestyle changes which include good diet and exercise. Fall precautions discussed with patient. If an FOBT was given today- please return it to our front desk. If you are over 26 years old - you may need Prevnar 31 or the adult Pneumonia vaccine.  **Flu shots are available--- please call and schedule a FLU-CLINIC appointment**  After your visit with [place] today you will receive a survey in the mail or online from [employer] regarding your care with [place]. Please take a moment to fill this out. Your feedback is very important to [place] as you can help [place] better understand your patient needs as well as improve your experience  and satisfaction. WE CARE ABOUT YOU!!!    We will refill his doxycycline to take on an as-needed basis for his prostatitis. Arrie Senate MD

## 2016-06-25 NOTE — Patient Instructions (Addendum)
Continue current medications. Continue good therapeutic lifestyle changes which include good diet and exercise. Fall precautions discussed with patient. If an FOBT was given today- please return it to our front desk. If you are over 47 years old - you may need Prevnar 13 or the adult Pneumonia vaccine.  **Flu shots are available--- please call and schedule a FLU-CLINIC appointment**  After your visit with us today you will receive a survey in the mail or online from [employer] regarding your care with us. Please take a moment to fill this out. Your feedback is very important to us as you can help us better understand your patient needs as well as improve your experience and satisfaction. WE CARE ABOUT YOU!!!   Drink plenty of fluids and stay well hydrated Continue to use nasal saline frequently If nasal congestion appears to get worse to Flonase over-the-counter 1 spray each nostril Prescription for doxycycline called in that patient will take on an as-needed basis for any bouts with increased problems with prostatitis. We will call with results of urine and lab work as soon as those results become available Follow-up with cardiology as planned

## 2016-06-25 NOTE — Addendum Note (Signed)
Addended by: Magdalene RiverBULLINS, Jatavian Calica H on: 06/25/2016 04:59 PM   Modules accepted: Orders

## 2016-06-26 LAB — URINE CULTURE: Organism ID, Bacteria: NO GROWTH

## 2016-06-27 ENCOUNTER — Telehealth: Payer: Self-pay | Admitting: *Deleted

## 2016-06-27 NOTE — Telephone Encounter (Deleted)
-----   Message from Beverley FiedlerJay M Pyrtle, MD sent at 06/27/2016  9:57 AM EST ----- Yes, Dr. Christell ConstantMoore asked me to perform the procedure and I feel this will okay with Dr. Leone PayorGessner as he has not seen him in 11 years JMP  ----- Message ----- From: Richardson Chiquitoorothy N Kc Summerson, CMA Sent: 06/27/2016   9:27 AM To: Beverley FiedlerJay M Pyrtle, MD  This appears to be a previous Gessner patient. Should I have him placed on yours? ----- Message ----- From: Beverley FiedlerJay M Pyrtle, MD Sent: 06/26/2016   1:46 PM To: Richardson Chiquitoorothy N Marcelle Hepner, CMA, Magdalene RiverJamie H Bullins, LPN  Would start screening now I will let my staff know Thanks Beverlyn RouxJay  Dottie Please arrange screening colon, family history of rectal cancer in patient's father JMP  ----- Message ----- From: Magdalene RiverJamie H Bullins, LPN Sent: 16/10/960412/13/2017   5:03 PM To: Beverley FiedlerJay M Pyrtle, MD  Hi Dr Rhea BeltonPyrtle,  Dr Christell ConstantMoore is wanting this pt to see you in the future. He had a colonoscopy 03/26/05. His father had rectal cancer. This pt is now 45 yr old. Dr Christell ConstantMoore wonders if he needs a colon now or should he wait until he is 48 yrs. ?  Thanks for your input, Berton LanJamie Bullins, LPN

## 2016-06-27 NOTE — Telephone Encounter (Signed)
-----   Message from Jay M Pyrtle, MD sent at 06/27/2016  9:57 AM EST ----- Yes, Dr. [name] asked me to perform the procedure and I feel this will okay with Dr. Gessner as he has not seen him in 11 years JMP  ----- Message ----- From: Kumar Falwell N Marlei Glomski, CMA Sent: 06/27/2016   9:27 AM To: Jay M Pyrtle, MD  This appears to be a previous Gessner patient. Should I have him placed on yours? ----- Message ----- From: Jay M Pyrtle, MD Sent: 06/26/2016   1:46 PM To: Alva Broxson N Irfan Veal, CMA, Jamie H Bullins, LPN  Would start screening now I will let my staff know Thanks Jay  Dottie Please arrange screening colon, family history of rectal cancer in patient's father JMP  ----- Message ----- From: Jamie H Bullins, LPN Sent: 06/25/2016   5:03 PM To: Jay M Pyrtle, MD  Hi Dr Pyrtle,  Dr [name] is wanting this pt to see you in the future. He had a colonoscopy 03/26/05. His father had rectal cancer. This pt is now 45 yr old. Dr [name] wonders if he needs a colon now or should he wait until he is 48 yrs. ?  Thanks for your input, Jamie Bullins, LPN    

## 2016-06-27 NOTE — Telephone Encounter (Signed)
I have spoken to patient. He is not able to schedule a colonoscopy right now but states he will be out of school Tuesday of next week and would like to schedule a colonoscopy at that time. I will call him back.

## 2016-07-01 NOTE — Telephone Encounter (Signed)
I have again contacted patient to schedule colonoscopy. He again tells me he cant schedule at the moment due to work. He states he will call us back later today to schedule.

## 2016-07-10 ENCOUNTER — Ambulatory Visit (INDEPENDENT_AMBULATORY_CARE_PROVIDER_SITE_OTHER): Payer: BC Managed Care – PPO | Admitting: Family Medicine

## 2016-07-10 ENCOUNTER — Encounter: Payer: Self-pay | Admitting: Family Medicine

## 2016-07-10 VITALS — BP 122/86 | HR 76 | Temp 97.7°F | Ht 76.0 in | Wt 198.0 lb

## 2016-07-10 DIAGNOSIS — L209 Atopic dermatitis, unspecified: Secondary | ICD-10-CM | POA: Diagnosis not present

## 2016-07-10 DIAGNOSIS — L853 Xerosis cutis: Secondary | ICD-10-CM

## 2016-07-10 NOTE — Progress Notes (Signed)
   Subjective:    Patient ID: Charles Rasmussen, male    DOB: [birth year], 45 y.o.   MRN: [id number]  HPI Patient here today for rash on right hip area. The patient is concerned that he may have shingles. The area of skin involved is not painful.  Patient Active Problem List   Diagnosis Date Noted  . Palpitations 01/10/2015  . 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 PVC (premature ventricular contraction) 02/19/2014  . Pectus excavatum 11/07/2013  . Thrombosed external hemorrhoid 12/21/2012  . Chronic prostatitis 10/28/2012  . Hyperlipidemia 10/28/2012  . MVP (mitral valve prolapse) 10/28/2012  . Varicose veins of lower extremities with other complications 12/30/2011   Outpatient Encounter Prescriptions as of 07/10/2016  Medication Sig  . metoprolol tartrate (LOPRESSOR) 25 MG tablet Take 12.5 mg by mouth 2 (two) times daily as needed (skipped heart beats).  . doxycycline (VIBRA-TABS) 100 MG tablet TAKE 1 TABLET (100 MG TOTAL) BY MOUTH 2 (TWO) TIMES DAILY. (Patient not taking: Reported on 07/10/2016)   No facility-administered encounter medications on file as of 07/10/2016.       Review of Systems  Constitutional: Negative.   HENT: Negative.   Eyes: Negative.   Respiratory: Negative.   Cardiovascular: Negative.   Gastrointestinal: Negative.   Endocrine: Negative.   Genitourinary: Negative.   Musculoskeletal: Negative.   Skin: Positive for rash (right hip area ).  Allergic/Immunologic: Negative.   Neurological: Negative.   Hematological: Negative.   Psychiatric/Behavioral: Negative.        Objective:   Physical Exam BP 122/86 (BP Location: Left Arm)   Pulse 76   Temp 97.7 F (36.5 C) (Oral)   Ht 6\' 4"  (1.93 m)   Wt 198 lb (89.8 kg)   BMI 24.10 kg/m  The patient's skin is very dry in general especially under his underwear. There is a very mild excoriated rash that is about a couple of inches long. It is not in a dermatome area.      Assessment & Plan:  1. Dry skin  dermatitis -Use scent free fabric softeners detergents and soaps -Use cortisone 10 sparingly to the area involved for 5-7 days 2-3 times daily.  2. Atopic dermatitis, unspecified type -Avoid [address] [place] [employer] [place] and use only scent free fabric softeners and detergents. -Apply cortisone 10 as directed  Patient Instructions  Use cortisone 10 2-3 times daily sparingly Make sure that the detergent being used is scent free and dermatology approved like all Use fabric softeners that are scent free Use mild soap that is scent free and avoid [address] [place] [employer] [place] After using the cortisone 10 for 3-4 days call us back if the rash is not improving  Nyra Capeson W. [name] MD

## 2016-07-10 NOTE — Patient Instructions (Signed)
Use cortisone 10 2-3 times daily sparingly Make sure that the detergent being used is scent free and dermatology approved like all Use fabric softeners that are scent free Use mild soap that is scent free and avoid [address] [place] After using the cortisone 10 for 3-4 days call us back if the rash is not improving

## 2016-07-20 ENCOUNTER — Encounter (HOSPITAL_COMMUNITY): Payer: Self-pay | Admitting: Emergency Medicine

## 2016-07-20 ENCOUNTER — Emergency Department (HOSPITAL_COMMUNITY)
Admission: EM | Admit: 2016-07-20 | Discharge: 2016-07-20 | Disposition: A | Discharge status: Home / Self Care | Payer: BC Managed Care – PPO | Attending: Emergency Medicine | Admitting: Emergency Medicine

## 2016-07-20 DIAGNOSIS — E039 Hypothyroidism, unspecified: Secondary | ICD-10-CM | POA: Insufficient documentation

## 2016-07-20 DIAGNOSIS — I4891 Unspecified atrial fibrillation: Secondary | ICD-10-CM | POA: Diagnosis not present

## 2016-07-20 LAB — CBC
HCT: 42.9 % (ref 39.0–52.0)
HEMOGLOBIN: 15.1 g/dL (ref 13.0–17.0)
MCH: 30.3 pg (ref 26.0–34.0)
MCHC: 35.2 g/dL (ref 30.0–36.0)
MCV: 86 fL (ref 78.0–100.0)
Platelets: 267 10*3/uL (ref 150–400)
RBC: 4.99 MIL/uL (ref 4.22–5.81)
RDW: 12.4 % (ref 11.5–15.5)
WBC: 6.5 10*3/uL (ref 4.0–10.5)

## 2016-07-20 LAB — BASIC METABOLIC PANEL
Anion gap: 9 (ref 5–15)
BUN: 17 mg/dL (ref 6–20)
CO2: 22 mmol/L (ref 22–32)
Calcium: 9.7 mg/dL (ref 8.9–10.3)
Chloride: 108 mmol/L (ref 101–111)
Creatinine, Ser: 1.04 mg/dL (ref 0.61–1.24)
GFR calc non Af Amer: >60 mL/min (ref >60)
Glucose, Bld: 148 mg/dL — ABNORMAL HIGH (ref 65–99)
POTASSIUM: 3.9 mmol/L (ref 3.5–5.1)
SODIUM: 139 mmol/L (ref 135–145)

## 2016-07-20 LAB — T4, FREE: FREE T4: 0.71 ng/dL (ref 0.61–1.12)

## 2016-07-20 LAB — TSH: TSH: 6.113 u[IU]/mL — AB (ref 0.350–4.500)

## 2016-07-20 MED ORDER — RIVAROXABAN 20 MG PO TABS
20.0000 mg | ORAL_TABLET | Freq: Once | ORAL | Status: AC
  Administered 2016-07-20: 20 mg via ORAL
  Filled 2016-07-20: qty 1

## 2016-07-20 MED ORDER — KETAMINE HCL 10 MG/ML IJ SOLN
INTRAMUSCULAR | Status: DC | PRN
  Administered 2016-07-20: 25 mg via INTRAVENOUS

## 2016-07-20 MED ORDER — PROPOFOL 10 MG/ML IV BOLUS
0.5000 mg/kg | Freq: Once | INTRAVENOUS | Status: AC
  Administered 2016-07-20: 44.9 mg via INTRAVENOUS
  Filled 2016-07-20: qty 20

## 2016-07-20 MED ORDER — PROPOFOL 10 MG/ML IV BOLUS
INTRAVENOUS | Status: DC | PRN
  Administered 2016-07-20 (×2): 25 mg via INTRAVENOUS

## 2016-07-20 MED ORDER — KETAMINE HCL-SODIUM CHLORIDE 100-0.9 MG/10ML-% IV SOSY
0.5000 mg/kg | PREFILLED_SYRINGE | Freq: Once | INTRAVENOUS | Status: AC
  Administered 2016-07-20: 45 mg via INTRAVENOUS

## 2016-07-20 MED ORDER — METOPROLOL TARTRATE 5 MG/5ML IV SOLN
5.0000 mg | INTRAVENOUS | Status: AC | PRN
  Administered 2016-07-20 (×3): 5 mg via INTRAVENOUS
  Filled 2016-07-20 (×2): qty 5

## 2016-07-20 MED ORDER — RIVAROXABAN (XARELTO) EDUCATION KIT FOR AFIB PATIENTS
PACK | Freq: Once | Status: AC
Start: 2016-07-20 — End: 2016-07-20
  Administered 2016-07-20: 06:00:00
  Filled 2016-07-20: qty 1

## 2016-07-20 MED ORDER — RIVAROXABAN 20 MG PO TABS: 20.0000 mg | ORAL_TABLET | Freq: Every day | ORAL | 0 refills | Status: DC

## 2016-07-20 NOTE — ED Provider Notes (Signed)
[facility]EMERGENCY DEPT Provider Note   CSN: [id number] Arrival date & time: 07/20/16  0045   By signing my name below, I, Arianna Nassar, attest that this documentation has been prepared under the direction and in the presence of Gilda Crease, MD.  Electronically Signed: Octavia Heir, ED Scribe. 07/20/16. 12:57 AM.   History   Chief Complaint Chief Complaint  Patient presents with  . Atrial Fibrillation    The history is provided by the patient and the EMS personnel. No language interpreter was used.   HPI Comments: Charles Rasmussen is a 46 y.o. male brought in by ambulance, who has a PMhx of palpitations, PVC, MVP, and pectus excavatum presents to the Emergency Department complaining of constant, acute onset, palpitations that began PTA. He says that he fell asleep on the cough this evening when he woke up to the acute onset palpitation. Pt is followed by Dr. Graciela Husbands for his palpitations. Pt notes that his palpitations have never lasted this long. He did not receive any medication by EMS but states he did receive some O2. He denies any current chest pain, diaphoresis or shortness of breath.   Past Medical History:  Diagnosis Date  . Hemorrhoid thrombosis   . Mitral valve prolapse   . Unspecified hemorrhoids without mention of complication 2006  . Venous reflux 01/02/12   deep right per [name]. Hart Rochester, M.D.    Patient Active Problem List   Diagnosis Date Noted  . Palpitations 01/10/2015  . 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 PVC (premature ventricular contraction) 02/19/2014  . Pectus excavatum 11/07/2013  . Thrombosed external hemorrhoid 12/21/2012  . Chronic prostatitis 10/28/2012  . Hyperlipidemia 10/28/2012  . MVP (mitral valve prolapse) 10/28/2012  . 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 MOUTH SURGERY     wisdom teeth  . neg hx         Home Medications    Prior to  Admission medications   Medication Sig Start Date End Date Taking? Authorizing Provider  metoprolol tartrate (LOPRESSOR) 25 MG tablet Take 12.5 mg by mouth 2 (two) times daily as needed (skipped heart beats).   Yes Historical Provider, MD    Family History Family History  Problem Relation Age of Onset  . Colon cancer Father 34  . Rectal cancer Father   . Hypertension Father   . Colon cancer Maternal Grandfather 80  . Rectal cancer Paternal Grandfather   . Heart attack Paternal Uncle   . Stroke Neg Hx     Social History Social History  Substance Use Topics  . Smoking status: Never Smoker  . Smokeless tobacco: Never Used  . Alcohol use No     Allergies   Patient has no known allergies.   Review of Systems Review of Systems  Constitutional: Negative for diaphoresis.  Respiratory: Negative for shortness of breath.   Cardiovascular: Positive for palpitations. Negative for chest pain.  All other systems reviewed and are negative.    Physical Exam Updated Vital Signs BP 109/84   Pulse 79   Resp 16   SpO2 99%   Physical Exam  Constitutional: He is oriented to person, place, and time. He appears well-developed and well-nourished. No distress.  HENT:  Head: Normocephalic and atraumatic.  Right Ear: Hearing normal.  Left Ear: Hearing normal.  Nose: Nose normal.  Mouth/Throat: Oropharynx is clear and moist and mucous membranes are normal.  Eyes: Conjunctivae and EOM  are normal. Pupils are equal, round, and reactive to light.  Neck: Normal range of motion. Neck supple.  Cardiovascular: S1 normal and S2 normal.  An irregularly irregular rhythm present. Tachycardia present.  Exam reveals no gallop and no friction rub.   No murmur heard. Pulmonary/Chest: Effort normal and breath sounds normal. No respiratory distress. He exhibits no tenderness.  Abdominal: Soft. Normal appearance and bowel sounds are normal. There is no hepatosplenomegaly. There is no tenderness. There is  no rebound, no guarding, no tenderness at McBurney's point and negative Murphy's sign. No hernia.  Musculoskeletal: Normal range of motion.  Neurological: He is alert and oriented to person, place, and time. He has normal strength. No cranial nerve deficit or sensory deficit. Coordination normal. GCS eye subscore is 4. GCS verbal subscore is 5. GCS motor subscore is 6.  Skin: Skin is warm, dry and intact. No rash noted. No cyanosis.  Psychiatric: He has a normal mood and affect. His speech is normal and behavior is normal. Thought content normal.  Nursing note and vitals reviewed.    ED Treatments / Results  DIAGNOSTIC STUDIES: Oxygen Saturation is 99% on RA, normal by my interpretation.  COORDINATION OF CARE:  12:56 AM Discussed treatment plan with pt at bedside and pt agreed to plan.  Labs (all labs ordered are listed, but only abnormal results are displayed) Labs Reviewed  BASIC METABOLIC PANEL - Abnormal; Notable for the following:       Result Value   Glucose, Bld 148 (*)    All other components within normal limits  TSH - Abnormal; Notable for the following:    TSH 6.113 (*)    All other components within normal limits  CBC  T4, FREE  T3, FREE    EKG  EKG Interpretation  Date/Time:  Sunday July 20 2016 00:57:53 EST Ventricular Rate:  96 PR Interval:    QRS Duration: 91 QT Interval:  298 QTC Calculation: 377 R Axis:   74 Text Interpretation:  Atrial fibrillation Paired ventricular premature complexes RSR' in V1 or V2, probably normal variant Minimal ST depression, inferior leads Confirmed by Deforest Maiden  MD, Nymir Ringler ([id number]) on 07/20/2016 1:08:57 AM       Radiology No results found.  Procedures .Cardioversion Date/Time: 07/20/2016 5:13 AM Performed by: Gilda Crease Authorized by: Gilda Crease   Consent:    Consent obtained:  Written   Consent given by:  Patient   Risks discussed:  Cutaneous burn, death, induced arrhythmia and pain    Alternatives discussed:  Rate-control medication and referral Universal protocol:    Procedure explained and questions answered to patient or proxy's satisfaction: yes     Relevant documents present and verified: yes     Test results available and properly labeled: yes     Imaging studies available: yes     Required blood products, implants, devices, and special equipment available: yes     Site/side marked: yes     Immediately prior to procedure a time out was called: yes     Patient identity confirmed:  Verbally with patient and hospital-assigned identification number Pre-procedure details:    Cardioversion basis:  Emergent   Rhythm:  Atrial fibrillation   Electrode placement:  Anterior-posterior Attempt one:    Cardioversion mode:  Synchronous   Waveform:  Biphasic   Shock (Joules):  120   Shock outcome:  No change in rhythm Attempt two:    Cardioversion mode:  Synchronous   Waveform:  Biphasic  Shock (Joules):  150   Shock outcome:  Conversion to normal sinus rhythm Post-procedure details:    Patient status:  Awake   Patient tolerance of procedure:  Tolerated well, no immediate complications .Sedation Date/Time: 07/20/2016 5:15 AM Performed by: Gilda Crease Authorized by: Gilda Crease   Consent:    Consent obtained:  Written   Consent given by:  Patient   Risks discussed:  Inadequate sedation, respiratory compromise necessitating ventilatory assistance and intubation, nausea and vomiting Universal protocol:    Procedure explained and questions answered to patient or proxy's satisfaction: yes     Relevant documents present and verified: yes     Test results available and properly labeled: yes     Imaging studies available: yes     Required blood products, implants, devices, and special equipment available: yes     Site/side marked: yes     Immediately prior to procedure a time out was called: yes     Patient identity confirmation method:  Verbally  with patient, hospital-assigned identification number and arm band Indications:    Procedure performed:  Cardioversion   Procedure necessitating sedation performed by:  Physician performing sedation   Intended level of sedation:  Moderate (conscious sedation) Pre-sedation assessment:    ASA classification: class 1 - normal, healthy patient     Neck mobility: normal     Mouth opening:  3 or more finger widths   Mallampati score:  I - soft palate, uvula, fauces, pillars visible   History of difficult intubation: no   Immediate pre-procedure details:    Reviewed: vital signs, relevant labs/tests and NPO status     Verified: bag valve mask available, emergency equipment available, intubation equipment available, IV patency confirmed and oxygen available   Procedure details (see MAR for exact dosages):    Preoxygenation:  Room air   Sedation:  Propofol   Intra-procedure monitoring:  Blood pressure monitoring, cardiac monitor, continuous pulse oximetry, continuous capnometry, frequent LOC assessments and frequent vital sign checks   Intra-procedure events: none     Intra-procedure management:  Supplemental oxygen Post-procedure details:    Patient is stable for discharge or admission: yes     Patient tolerance:  Tolerated well, no immediate complications   (including critical care time)  Medications Ordered in ED Medications  propofol (DIPRIVAN) 10 mg/mL bolus/IV push (25 mg Intravenous Given 07/20/16 0405)  ketamine (KETALAR) injection (25 mg Intravenous Given 07/20/16 0404)  metoprolol (LOPRESSOR) injection 5 mg (5 mg Intravenous Given 07/20/16 0125)  ketamine 100 mg in normal saline 10 mL (10mg /mL) syringe (45 mg Intravenous Given 07/20/16 0349)  propofol (DIPRIVAN) 10 mg/mL bolus/IV push 44.9 mg (44.9 mg Intravenous Given 07/20/16 0349)     Initial Impression / Assessment and Plan / ED Course  I have reviewed the triage vital signs and the nursing notes.  Pertinent labs & imaging results  that were available during my care of the patient were reviewed by me and considered in my medical decision making (see chart for details).  Clinical Course   Patient presents to the emergency part for evaluation of palpitations. Patient has a long history of palpitations, seen by Dr. Graciela Husbands in cardiology. Outpatient monitoring has only revealed PVCs. At arrival to the ER, however, he has been shown to be in atrial fibrillation with rapid ventricular response. Patient had sudden onset of palpitations within the last 1 or 2 hours. He was given Lopressor and achieved rate control, but has not converted to sinus rhythm.  Discussed with on-call cardiology, Dr. Vonzella NippleWosik. Presentation, history, workup has been discussed and it was agreed that the patient was a candidate for electrical cardioversion. CHADS-VASc is 0. REcommends anticoagulation.  Cardioversion performed without complication.  CRITICAL CARE Performed by: Gilda CreasePOLLINA, Dalene Robards J.   Total critical care time: 30 minutes  Critical care time was exclusive of separately billable procedures and treating other patients.  Critical care was necessary to treat or prevent imminent or life-threatening deterioration.  Critical care was time spent personally by me on the following activities: development of treatment plan with patient and/or surrogate as well as nursing, discussions with consultants, evaluation of patient's response to treatment, examination of patient, obtaining history from patient or surrogate, ordering and performing treatments and interventions, ordering and review of laboratory studies, ordering and review of radiographic studies, pulse oximetry and re-evaluation of patient's condition.   Final Clinical Impressions(s) / ED Diagnoses   Final diagnoses:  Atrial fibrillation with RVR (HCC)  Hypothyroidism, unspecified type   I personally performed the services described in this documentation, which was scribed in my presence. The  recorded information has been reviewed and is accurate.  New Prescriptions New Prescriptions   No medications on file     Gilda Creasehristopher J Gyan Cambre, MD 07/20/16 [phone]

## 2016-07-20 NOTE — ED Triage Notes (Signed)
[facility] for A-Fib. Pt states that he woke up and felt that his heart was out of rhythm. Pt has a hx of MVP. Pt takes metoprolol for this reason, but did not take it tonight. Pt is A&O x 4

## 2016-07-21 NOTE — Progress Notes (Signed)
Cardiology Office Note Date:  07/22/2016  Patient ID:  Charles Rasmussen, DOB [birth year], MRN [id number] PCP:  Rudi HeapMOORE, [name], MD  Cardiologist:  Dr. Shirlee LatchMcLean Electrophysiologist: Dr. Graciela HusbandsKlein   Chief Complaint: f/u after ER visit  History of Present Illness: Charles Rasmussen is a 46 y.o. male with history of palpitations, historically noted to have PVC's, MVP, pectus excavatum,  And chronic prostate infection on chronic doxycycline.  He comes in today to be seen for Dr. Graciela HusbandsKlein, last seen by him in Aug at that time his anxiety was being addressed with improvement in his palpitations.  Most recently in the ER yesterday brought in via EMS woken with palpitations and note to be in AF with RVR described, he underwent DDCV in the ED shocked twice (120J > 150J) to SR  He tells me that he has had very brief palpitations on/off for years.  Some are a couple seconds, not usually more then a minute, more predictably provoked by bending forward and again very short lived.  The night he went to the ER he was relaxing on the couch watching TV, laid down to shut his eyes for a while and woke feeling his heart racing/irregular.  He did not have CP, didi not perceive any other symptoms, no SOB, near syncope or lightheaded, he had no syncope, he did feel quite anxious though knowing something was wrong.  He walked upstairs and told his father he thought something was wrong, ultimately calling 911.  He was noted in Rapid AFib in the ER, given IV lopressor and eventually DCCV back to SR.  He was only taking the metoprolol PRN and very infrequently and was instructed to take it daily and started on Xarelto to f/u here.  AFib Hx 1st noted 07/21/15 DCCV in ER  Past Medical History:  Diagnosis Date  . Hemorrhoid thrombosis   . Mitral valve prolapse   . Unspecified hemorrhoids without mention of complication 2006  . Venous reflux 01/02/12   deep right per Quita SkyeJames D. Hart RochesterLawson, M.D.    Past Surgical History:  Procedure Laterality Date   . MOUTH SURGERY     wisdom teeth  . neg hx      Current Outpatient Prescriptions  Medication Sig Dispense Refill  . doxycycline (VIBRA-TABS) 100 MG tablet Take 100 mg by mouth 2 (two) times daily.    . metoprolol tartrate (LOPRESSOR) 25 MG tablet Take 12.5 mg by mouth 2 (two) times daily as needed (skipped heart beats).    . rivaroxaban (XARELTO) 20 MG TABS tablet Take 1 tablet (20 mg total) by mouth daily with supper. 30 tablet 0   No current facility-administered medications for this visit.     Allergies:   Patient has no known allergies.   Social History:  The patient  reports that he has never smoked. He has never used smokeless tobacco. He reports that he does not drink alcohol or use drugs.   Family History:  The patient's family history includes Colon cancer (age of onset: 5960) in his father; Colon cancer (age of onset: 1380) in his maternal grandfather; Heart attack in his paternal uncle; Hypertension in his father; Rectal cancer in his father and paternal grandfather.  ROS:  Please see the history of present illness.   All other systems are reviewed and otherwise negative.   PHYSICAL EXAM:  VS:  BP 114/76   Pulse 75   Ht 6\' 4"  (1.93 m)   Wt 194 lb (88 kg)   BMI 23.61  kg/m  BMI: Body mass index is 23.61 kg/m. Well nourished, well developed, in no acute distress  HEENT: normocephalic, atraumatic  Neck: no JVD, carotid bruits or masses Cardiac:  RRR; no significant murmurs, no rubs, or gallops Lungs:  clear to auscultation bilaterally, no wheezing, rhonchi or rales  Abd: soft, nontender MS: no deformity or atrophy Ext: no edema  Skin: warm and dry, no rash Neuro:  No gross deficits appreciated Psych: euthymic mood, full affect    EKG:  Done today and reviewed by myself is SR, 75bpm, PR , QRS 84ms, QTc 07/20/16: AFib 96bpm 02/27/14 TTE Study Conclusions - Left ventricle: There is a false tendon in the mid LV cavity seen only on the apica 3 chamber view  of no clinical significance. The cavity size was normal. Systolic function was normal. The estimated ejection fraction was in the range of 55% to 60%. Wall motion was normal; there were no regional wall motion abnormalities. - Mitral valve: The anterior mitral valve leaflet is elongated with possible trivial mitral valve prolapse and mild eccentric MR torwards the posterior LA wall. There was mild regurgitation. - Tricuspid valve: There was trivial regurgitation. - Pulmonic valve: There was trivial regurgitation.  Recent Labs: 07/20/2016: BUN 17; Creatinine, Ser 1.04; Hemoglobin 15.1; Platelets 267; Potassium 3.9; Sodium 139; TSH 6.113  No results found for requested labs within last 8760 hours.   Estimated Creatinine Clearance: 107.8 mL/min (by C-G formula based on SCr of 1.04 mg/dL).   Wt Readings from Last 3 Encounters:  07/22/16 194 lb (88 kg)  07/10/16 198 lb (89.8 kg)  06/25/16 196 lb (88.9 kg)     Other studies reviewed: Additional studies/records reviewed today include: summarized above  ASSESSMENT AND PLAN:  1. New Paroxysmal AFib     DCCV 07/20/16 in ER     CHA2DS2Vasc is zero, will finish 4 weeks of a/c s/p DCCV     Creat 1.04, Calc Cr. Cl is 112  He has significant palpitations historically only PVCs have been noted until now.  Will have him wear an event monitor to establish how reliable his symptoms are in monitoring his AF and update his echo. He is given information for the AFib clinic as a resource for him as well.  Will have him back in 6 weeks to evaluate symptoms, AF.  Lengthy discussion regarding AFib, was quite anxiety provoking for him, but not overtly symptomatic outside of the palpitations He does not drink, smoke or use drugs, he denies snoring or symptoms of sleep apnea, says he sleeps well, denies caffeine or stimulants of any kind, rarely will ave some chocolate.   Disposition: as above  Current medicines are reviewed at length with the  patient today.  The patient did not have any concerns regarding medicines.  Judith Blonder, PA-C 07/22/2016 4:13 PM     [facility] [address] [place] [address] [phone] (office)  [phone] (fax)

## 2016-07-22 ENCOUNTER — Ambulatory Visit (INDEPENDENT_AMBULATORY_CARE_PROVIDER_SITE_OTHER): Payer: BC Managed Care – PPO | Admitting: Physician Assistant

## 2016-07-22 VITALS — BP 114/76 | HR 75 | Ht 76.0 in | Wt 194.0 lb

## 2016-07-22 DIAGNOSIS — I4891 Unspecified atrial fibrillation: Secondary | ICD-10-CM

## 2016-07-22 LAB — T3, FREE

## 2016-07-22 NOTE — Patient Instructions (Addendum)
Medication Instructions:   TAKE  XARELTO 20  MG ONCE A DAY FOR 4  MORE WEEKS THEN STOP UNTIL TOLD FURTHER  INSTRUCTIONS  If you need a refill on your cardiac medications before your next appointment, please call your pharmacy.  Labwork: NONE ORDERED  TODAY    Testing/Procedures: Your physician has requested that you have an echocardiogram. Echocardiography is a painless test that uses sound waves to create images of your heart. It provides your doctor with information about the size and shape of your heart and how well your heart's chambers and valves are working. This procedure takes approximately one hour. There are no restrictions for this procedure.  Your physician has recommended that you wear an event monitor. Event monitors are medical devices that record the heart's electrical activity. Doctors most often us these monitors to diagnose arrhythmias. Arrhythmias are problems with the speed or rhythm of the heartbeat. The monitor is a small, portable device. You can wear one while you do your normal daily activities. This is usually used to diagnose what is causing palpitations/syncope (passing out).     Follow-Up: IN 6 WEEKS WITH DR Graciela HusbandsKLEIN    Any Other Special Instructions Will Be Listed Below (If Applicable).

## 2016-07-23 ENCOUNTER — Other Ambulatory Visit: Payer: Self-pay | Admitting: *Deleted

## 2016-07-23 MED ORDER — RIVAROXABAN 20 MG PO TABS: 20.0000 mg | ORAL_TABLET | Freq: Every day | ORAL | 0 refills | Status: DC

## 2016-07-23 NOTE — Telephone Encounter (Signed)
Patient called and stated that he was seen here for an office visit yesterday and was expecting an rx for xarelto to be sent to the pharmacy. He went to the pharmacy and they stated that they did not receive anything. Per the snapshot an rx was printed on 07/20/16, patient states that he does not have it. I will send in a new rx.  07/22/2016 3:23 PM After Visit Summary Printed Oleta MouseShana M Overton, CMA  Patient Instructions   Medication Instructions:   TAKE  XARELTO 20  MG ONCE A DAY FOR 4  MORE WEEKS THEN STOP UNTIL TOLD FURTHER  INSTRUCTIONS

## 2016-08-05 ENCOUNTER — Encounter: Payer: Self-pay | Admitting: *Deleted

## 2016-08-05 ENCOUNTER — Other Ambulatory Visit (HOSPITAL_COMMUNITY): Payer: Self-pay

## 2016-08-05 NOTE — Progress Notes (Signed)
Patient ID: [name], male   DOB: Jan 20, 1971, 46 y.o.   MRN: [id number] Patient did not show up for 08/05/16, 8:30 AM appointment to have a 30 day cardiac event monitor applied.  He has a follow up with Dr. Graciela HusbandsKlein on 09/08/16.  If rescheduled results will not be available for follow up appointment.

## 2016-08-05 NOTE — Progress Notes (Signed)
Please call him and reschedule is appt thanks

## 2016-08-12 ENCOUNTER — Encounter: Payer: Self-pay | Admitting: Internal Medicine

## 2016-08-20 ENCOUNTER — Other Ambulatory Visit: Payer: Self-pay | Admitting: Nurse Practitioner

## 2016-08-20 DIAGNOSIS — I341 Nonrheumatic mitral (valve) prolapse: Secondary | ICD-10-CM

## 2016-08-20 DIAGNOSIS — I493 Ventricular premature depolarization: Secondary | ICD-10-CM

## 2016-09-02 ENCOUNTER — Ambulatory Visit (INDEPENDENT_AMBULATORY_CARE_PROVIDER_SITE_OTHER): Payer: BC Managed Care – PPO

## 2016-09-02 ENCOUNTER — Ambulatory Visit (HOSPITAL_COMMUNITY): Payer: BC Managed Care – PPO | Attending: Cardiovascular Disease

## 2016-09-02 ENCOUNTER — Other Ambulatory Visit: Payer: Self-pay

## 2016-09-02 ENCOUNTER — Other Ambulatory Visit: Payer: Self-pay | Admitting: Physician Assistant

## 2016-09-02 DIAGNOSIS — I4891 Unspecified atrial fibrillation: Secondary | ICD-10-CM

## 2016-09-02 DIAGNOSIS — I493 Ventricular premature depolarization: Secondary | ICD-10-CM | POA: Diagnosis not present

## 2016-09-02 DIAGNOSIS — I341 Nonrheumatic mitral (valve) prolapse: Secondary | ICD-10-CM

## 2016-09-02 DIAGNOSIS — I081 Rheumatic disorders of both mitral and tricuspid valves: Secondary | ICD-10-CM | POA: Insufficient documentation

## 2016-09-03 ENCOUNTER — Other Ambulatory Visit: Payer: Self-pay

## 2016-09-03 ENCOUNTER — Encounter (HOSPITAL_COMMUNITY): Payer: Self-pay

## 2016-09-03 ENCOUNTER — Emergency Department (HOSPITAL_COMMUNITY)
Admission: EM | Admit: 2016-09-03 | Discharge: 2016-09-03 | Disposition: A | Discharge status: Home / Self Care | Payer: BC Managed Care – PPO | Attending: Emergency Medicine | Admitting: Emergency Medicine

## 2016-09-03 ENCOUNTER — Emergency Department (HOSPITAL_COMMUNITY): Payer: BC Managed Care – PPO

## 2016-09-03 ENCOUNTER — Telehealth: Payer: Self-pay | Admitting: Physician Assistant

## 2016-09-03 DIAGNOSIS — I4891 Unspecified atrial fibrillation: Secondary | ICD-10-CM | POA: Insufficient documentation

## 2016-09-03 DIAGNOSIS — Z7901 Long term (current) use of anticoagulants: Secondary | ICD-10-CM | POA: Diagnosis not present

## 2016-09-03 DIAGNOSIS — Z79899 Other long term (current) drug therapy: Secondary | ICD-10-CM | POA: Insufficient documentation

## 2016-09-03 DIAGNOSIS — R002 Palpitations: Secondary | ICD-10-CM | POA: Diagnosis present

## 2016-09-03 HISTORY — DX: Unspecified atrial fibrillation: I48.91

## 2016-09-03 LAB — I-STAT TROPONIN, ED: Troponin i, poc: 0 ng/mL (ref 0.00–0.08)

## 2016-09-03 LAB — MAGNESIUM: MAGNESIUM: 2 mg/dL (ref 1.7–2.4)

## 2016-09-03 LAB — BASIC METABOLIC PANEL
ANION GAP: 10 (ref 5–15)
BUN: 12 mg/dL (ref 6–20)
CO2: 21 mmol/L — ABNORMAL LOW (ref 22–32)
Calcium: 9.8 mg/dL (ref 8.9–10.3)
Chloride: 107 mmol/L (ref 101–111)
Creatinine, Ser: 0.93 mg/dL (ref 0.61–1.24)
GLUCOSE: 125 mg/dL — AB (ref 65–99)
POTASSIUM: 3.5 mmol/L (ref 3.5–5.1)
SODIUM: 138 mmol/L (ref 135–145)

## 2016-09-03 LAB — CBC
HEMATOCRIT: 42.1 % (ref 39.0–52.0)
HEMOGLOBIN: 14.7 g/dL (ref 13.0–17.0)
MCH: 29.9 pg (ref 26.0–34.0)
MCHC: 34.9 g/dL (ref 30.0–36.0)
MCV: 85.6 fL (ref 78.0–100.0)
Platelets: 328 10*3/uL (ref 150–400)
RBC: 4.92 MIL/uL (ref 4.22–5.81)
RDW: 12.3 % (ref 11.5–15.5)
WBC: 7.9 10*3/uL (ref 4.0–10.5)

## 2016-09-03 MED ORDER — METOPROLOL TARTRATE 5 MG/5ML IV SOLN
10.0000 mg | Freq: Once | INTRAVENOUS | Status: AC
  Administered 2016-09-03: 10 mg via INTRAVENOUS
  Filled 2016-09-03: qty 10

## 2016-09-03 MED ORDER — RIVAROXABAN 20 MG PO TABS: 20.0000 mg | ORAL_TABLET | Freq: Every day | ORAL | 0 refills | Status: DC

## 2016-09-03 MED ORDER — LORAZEPAM 2 MG/ML IJ SOLN
1.0000 mg | Freq: Once | INTRAMUSCULAR | Status: AC
  Administered 2016-09-03: 1 mg via INTRAVENOUS

## 2016-09-03 MED ORDER — ETOMIDATE 2 MG/ML IV SOLN
INTRAVENOUS | Status: AC | PRN
  Administered 2016-09-03: 10 mg via INTRAVENOUS

## 2016-09-03 MED ORDER — ETOMIDATE 2 MG/ML IV SOLN
10.0000 mg | Freq: Once | INTRAVENOUS | Status: AC
  Administered 2016-09-03: 10 mg via INTRAVENOUS
  Filled 2016-09-03: qty 10

## 2016-09-03 MED ORDER — SODIUM CHLORIDE 0.9 % IV BOLUS (SEPSIS)
1000.0000 mL | Freq: Once | INTRAVENOUS | Status: AC
  Administered 2016-09-03: 1000 mL via INTRAVENOUS

## 2016-09-03 MED ORDER — LORAZEPAM 2 MG/ML IJ SOLN
INTRAMUSCULAR | Status: AC
  Filled 2016-09-03: qty 1

## 2016-09-03 MED ORDER — METOPROLOL SUCCINATE ER 25 MG PO TB24: 50.0000 mg | ORAL_TABLET | Freq: Every day | ORAL | 0 refills | Status: DC

## 2016-09-03 NOTE — ED Notes (Signed)
Pt placed back on ekg monitor and HR still 160s a-fib

## 2016-09-03 NOTE — ED Notes (Signed)
While pt being stuck for blood draw pt's HR dropped to 87 on dinamap, HR in the 80s palpated and pt states "I feel like it slowed down"

## 2016-09-03 NOTE — Telephone Encounter (Signed)
Paged by the patient and [employer] monitor company around the same time, per patient, right after he got home he went into afib. Unfortunately he was only on Xarelto for 1 month, and stopped last week. I have been seen by Dr. Graciela HusbandsKlein before and by Mack Guiseenee Ursury PA-C in Jan, he is wearing a monitor, not sure why the monitor was ordered on Jan 8th and monitor company says he only put it on yesterday. But talking with AJ of [employer] [phone]), he says according to recording, patient only went into afib around 5PM standard [place] time, prior to that, he was in sinus with occasional sinus tach.   When I called him, he did take a PRN metoprolol and [facility]. I am fine with that. He may need ED cardioversion and place back on Xarelto again. It may also be beneficial for him to be on scheduled BB instead of PRN.   Ramond DialSigned, Charles Sens PA Pager: [phone]

## 2016-09-03 NOTE — ED Provider Notes (Signed)
[facility]ER[facility]ENCY DEPT Provider Note   CSN: [id number] Arrival date & time: 09/03/16  1812     History   Chief Complaint Chief Complaint  Patient presents with  . Palpitations    HPI Charles Rasmussen is a 46 y.o. male.  46 yo M with a chief onset of palpitations. Patient has had no episodes of this in the past. He had a recent ED visit where he was cardioverted for acute onset atrial fibrillation. He had placed on a cardiac monitor just a couple hours ago and was noted to have an onset of symptoms. Called by cards to the ED for evaluation. He has taken 12.5 mg metoprolol without improvement at home.   The history is provided by the patient and a parent.  Palpitations   This is a recurrent problem. The current episode started 1 to 2 hours ago. The problem occurs constantly. The problem has not changed since onset.On average, each episode lasts 2 hours. Associated symptoms include chest pressure and weakness. Pertinent negatives include no fever, no chest pain, no abdominal pain, no vomiting, no headaches and no shortness of breath. He has tried nothing for the symptoms. The treatment provided no relief. There are no known risk factors.    Past Medical History:  Diagnosis Date  . Atrial fibrillation (HCC)   . Hemorrhoid thrombosis   . Mitral valve prolapse   . Unspecified hemorrhoids without mention of complication 2006  . Venous reflux 01/02/12   deep right per [name]. Hart Rochester, M.D.    Patient Active Problem List   Diagnosis Date Noted  . Palpitations 01/10/2015  . 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 PVC (premature ventricular contraction) 02/19/2014  . Pectus excavatum 11/07/2013  . Thrombosed external hemorrhoid 12/21/2012  . Chronic prostatitis 10/28/2012  . Hyperlipidemia 10/28/2012  . MVP (mitral valve prolapse) 10/28/2012  . 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 MOUTH SURGERY     wisdom teeth  . neg hx         Home Medications    Prior to Admission medications   Medication Sig Start Date End Date Taking? Authorizing Provider  Artificial Tear Solution (TEARS RENEWED OP) Place 1-2 drops into both eyes 3 (three) times daily as needed (for dry eyes).   Yes Historical Provider, MD  metoprolol tartrate (LOPRESSOR) 25 MG tablet Take 12.5 mg by mouth 2 (two) times daily as needed (skipped heart beats).   Yes Historical Provider, MD  metoprolol succinate (TOPROL-XL) 25 MG 24 hr tablet Take 2 tablets (50 mg total) by mouth daily. 09/03/16   Melene Plan, DO  rivaroxaban (XARELTO) 20 MG TABS tablet Take 1 tablet (20 mg total) by mouth daily with supper. Patient not taking: Reported on 09/03/2016 07/23/16   Sheilah Pigeon, PA-C  rivaroxaban (XARELTO) 20 MG TABS tablet Take 1 tablet (20 mg total) by mouth daily with supper. 09/03/16   Melene Plan, DO    Family History Family History  Problem Relation Age of Onset  . Colon cancer Father 42  . Rectal cancer Father   . Hypertension Father   . Colon cancer Maternal Grandfather 80  . Rectal cancer Paternal Grandfather   . Heart attack Paternal Uncle   . Stroke Neg Hx     Social History Social History  Substance Use Topics  . Smoking status: Never Smoker  . Smokeless tobacco: Never Used  . Alcohol use No  Allergies   Patient has no known allergies.   Review of Systems Review of Systems  Constitutional: Negative for chills and fever.  HENT: Negative for congestion and facial swelling.   Eyes: Negative for discharge and visual disturbance.  Respiratory: Negative for shortness of breath.   Cardiovascular: Positive for palpitations. Negative for chest pain.  Gastrointestinal: Negative for abdominal pain, diarrhea and vomiting.  Musculoskeletal: Negative for arthralgias and myalgias.  Skin: Negative for color change and rash.  Neurological: Positive for weakness. Negative for tremors, syncope and headaches.    Psychiatric/Behavioral: Negative for confusion and dysphoric mood.     Physical Exam Updated Vital Signs BP 104/67   Pulse 71   Temp 98 F (36.7 C) (Oral)   Resp 16   Ht 6\' 5"  (1.956 m)   Wt 190 lb (86.2 kg)   SpO2 100%   BMI 22.53 kg/m   Physical Exam  Constitutional: He is oriented to person, place, and time. He appears well-developed and well-nourished.  HENT:  Head: Normocephalic and atraumatic.  Eyes: EOM are normal. Pupils are equal, round, and reactive to light.  Neck: Normal range of motion. Neck supple. No JVD present.  Cardiovascular: An irregularly irregular rhythm present. Tachycardia present.  Exam reveals no gallop and no friction rub.   No murmur heard. Pulmonary/Chest: No respiratory distress. He has no wheezes.  Abdominal: He exhibits no distension. There is no rebound and no guarding.  Musculoskeletal: Normal range of motion.  Neurological: He is alert and oriented to person, place, and time.  Skin: No rash noted. No pallor.  Psychiatric: He has a normal mood and affect. His behavior is normal.  Nursing note and vitals reviewed.    ED Treatments / Results  Labs (all labs ordered are listed, but only abnormal results are displayed) Labs Reviewed  BASIC METABOLIC PANEL - Abnormal; Notable for the following:       Result Value   CO2 21 (*)    Glucose, Bld 125 (*)    All other components within normal limits  CBC  MAGNESIUM  I-STAT TROPOININ, ED    EKG  EKG Interpretation  Date/Time:  Wednesday September 03 2016 18:26:05 EST Ventricular Rate:  170 PR Interval:    QRS Duration: 82 QT Interval:  270 QTC Calculation: 454 R Axis:   83 Text Interpretation:  Atrial flutter with variable A-V block Anterior infarct , age undetermined Abnormal ECG Since last tracing rate faster st changes likely rate related Otherwise no significant change Confirmed by Adela Lank MD, DANIEL [phone]) on 09/03/2016 7:28:59 PM       Radiology Dg Chest 2 View  Result  Date: 09/03/2016 CLINICAL DATA:  Atrial fibrillation. EXAM: CHEST  2 VIEW COMPARISON:  06/25/2016 FINDINGS: The heart size and mediastinal contours are within normal limits. Both lungs are clear. The visualized skeletal structures are unremarkable. IMPRESSION: Negative.  No active cardiopulmonary disease. Electronically Signed   By: Myles Rosenthal M.D.   On: 09/03/2016 18:59    Procedures .Sedation Date/Time: 09/03/2016 11:07 PM Performed by: Adela Lank Yashica Sterbenz Authorized by: Melene Plan   Consent:    Consent obtained:  Written and verbal   Consent given by:  Patient and parent   Risks discussed:  Allergic reaction, prolonged hypoxia resulting in organ damage, prolonged sedation necessitating reversal, respiratory compromise necessitating ventilatory assistance and intubation, inadequate sedation, dysrhythmia, nausea and vomiting   Alternatives discussed:  Analgesia without sedation and anxiolysis Indications:    Procedure performed:  Cardioversion   Procedure necessitating  sedation performed by:  Physician performing sedation   Intended level of sedation:  Moderate (conscious sedation) Pre-sedation assessment:    ASA classification: class 1 - normal, healthy patient     Neck mobility: normal     Mouth opening:  3 or more finger widths   Thyromental distance:  4 finger widths   Mallampati score:  I - soft palate, uvula, fauces, pillars visible   Pre-sedation assessments completed and reviewed: airway patency, cardiovascular function, hydration status, mental status, nausea/vomiting, pain level, respiratory function and temperature     History of difficult intubation: no   Immediate pre-procedure details:    Reviewed: vital signs     Verified: bag valve mask available, emergency equipment available, intubation equipment available, IV patency confirmed, oxygen available and suction available   Procedure details (see MAR for exact dosages):    Preoxygenation:  Nasal cannula   Intra-procedure events  comment:  Myoclonus Post-procedure details:    Attendance: Constant attendance by certified staff until patient recovered     Recovery: Patient returned to pre-procedure baseline     Post-sedation assessments completed and reviewed: airway patency, cardiovascular function, hydration status, mental status, nausea/vomiting, pain level, respiratory function and temperature     Patient is stable for discharge or admission: yes     Patient tolerance:  Tolerated well, no immediate complications .Cardioversion Date/Time: 09/03/2016 11:09 PM Performed by: Melene PlanFLOYD, Dione Petron Authorized by: Melene PlanFLOYD, Charlotte Fidalgo   Consent:    Consent obtained:  Verbal and written   Consent given by:  Patient   Risks discussed:  Cutaneous burn and pain   Alternatives discussed:  Alternative treatment, referral, rate-control medication, delayed treatment and anti-coagulation medication Pre-procedure details:    Cardioversion basis:  Elective   Rhythm:  Atrial fibrillation   Electrode placement:  Anterior-posterior Attempt one:    Cardioversion mode:  Synchronous   Waveform:  Biphasic   Shock (Joules):  200   Shock outcome:  Conversion to normal sinus rhythm Post-procedure details:    Patient status:  Awake   Patient tolerance of procedure:  Tolerated well, no immediate complications   (including critical care time)  Medications Ordered in ED Medications  metoprolol (LOPRESSOR) injection 10 mg (10 mg Intravenous Given 09/03/16 1959)  sodium chloride 0.9 % bolus 1,000 mL (0 mLs Intravenous Stopped 09/03/16 2059)  etomidate (AMIDATE) injection 10 mg (10 mg Intravenous Given 09/03/16 2057)  etomidate (AMIDATE) injection (10 mg Intravenous Given 09/03/16 2050)  LORazepam (ATIVAN) injection 1 mg (1 mg Intravenous Given 09/03/16 2058)     Initial Impression / Assessment and Plan / ED Course  I have reviewed the triage vital signs and the nursing notes.  Pertinent labs & imaging results that were available during my care of the  patient were reviewed by me and considered in my medical decision making (see chart for details).     46 yo M With a chief complaint of palpitations. He arrived to the ED in A. fib with RVR. Started acutely about 2 hours ago.  Off xarelto for past week.   Discussed with Carncelli, cards.  Recommended increase metop dose to 50mg  a day.  Also recommended further discussion with patient about timing with recent lapse of anticoagulation.    The patient verified that he infected able to know when he flips into the rhythm every time. Discussed risks and benefits. Cardiomegaly without difficulty. Altered on Xarelto. Discharge to afib clinic.   This patients CHA2DS2-VASc Score and unadjusted Ischemic Stroke Rate (% per year) is  equal to 0.2 % stroke rate/year from a score of 0  Above score calculated as 1 point each if present [CHF, HTN, DM, Vascular=MI/PAD/Aortic Plaque, Age if 65-74, or Male] Above score calculated as 2 points each if present [Age > 75, or Stroke/TIA/TE]  11:12 PM:  I have discussed the diagnosis/risks/treatment options with the patient and family and believe the pt to be eligible for discharge home to follow-up with Cards. We also discussed returning to the ED immediately if new or worsening sx occur. We discussed the sx which are most concerning (e.g., sudden worsening pain, fever, inability to tolerate by mouth) that necessitate immediate return. Medications administered to the patient during their visit and any new prescriptions provided to the patient are listed below.  Medications given during this visit Medications  metoprolol (LOPRESSOR) injection 10 mg (10 mg Intravenous Given 09/03/16 1959)  sodium chloride 0.9 % bolus 1,000 mL (0 mLs Intravenous Stopped 09/03/16 2059)  etomidate (AMIDATE) injection 10 mg (10 mg Intravenous Given 09/03/16 2057)  etomidate (AMIDATE) injection (10 mg Intravenous Given 09/03/16 2050)  LORazepam (ATIVAN) injection 1 mg (1 mg Intravenous Given  09/03/16 2058)     The patient appears reasonably screen and/or stabilized for discharge and I doubt any other medical condition or other [facility] requiring further screening, evaluation, or treatment in the ED at this time prior to discharge.    Final Clinical Impressions(s) / ED Diagnoses   Final diagnoses:  Atrial fibrillation with RVR [facility])    New Prescriptions Discharge Medication List as of 09/03/2016 10:09 PM    START taking these medications   Details  metoprolol succinate (TOPROL-XL) 25 MG 24 hr tablet Take 2 tablets (50 mg total) by mouth daily., Starting Wed 09/03/2016, Print    !! rivaroxaban (XARELTO) 20 MG TABS tablet Take 1 tablet (20 mg total) by mouth daily with supper., Starting Wed 09/03/2016, Print     !! - Potential duplicate medications found. Please discuss with provider.       Melene Plan, DO 09/03/16 2312

## 2016-09-03 NOTE — ED Notes (Signed)
Pt alert. No reports of pain or discomfort.

## 2016-09-03 NOTE — ED Notes (Signed)
EDP at bedside  

## 2016-09-03 NOTE — Discharge Instructions (Signed)
Follow up with your cardiologist  

## 2016-09-03 NOTE — ED Triage Notes (Signed)
Pt endorses palpitations that began 45 mins pta while sitting down. Pt had a heart monitor placed yesterday for atrial fibrillation. Pt states "this feels like a-fib" HR 150s a-fib in triage. Pt denies chest pain but feels shob. Pt alert and oriented x 4.

## 2016-09-04 ENCOUNTER — Telehealth: Payer: Self-pay | Admitting: Internal Medicine

## 2016-09-04 ENCOUNTER — Telehealth: Payer: Self-pay | Admitting: *Deleted

## 2016-09-04 NOTE — Telephone Encounter (Signed)
Received fax of Day 2 monitor strip from [employer] at 4:01 pm (CT) of patient in rapid afib rate 190.  He spoke to Azalee CourseHao Meng, APP yesterday while this was occurring and was on his way to ED at that time.  He was treated in ED and discharged after being cardioverted back to NSR with plan per note to dc to AFIB clinic.  As per last OV note by Francis Dowseenee Ursuy on 1/9, pt should follow up with Dr. Graciela HusbandsKlein after monitor would be complete. Will send message to Dr. Odessa FlemingKlein's nurse for to inform.

## 2016-09-04 NOTE — Telephone Encounter (Signed)
I attempted to call the patient back at the contact # left. No answer- "voice mailbox is full." Will try to call back later today.

## 2016-09-04 NOTE — Telephone Encounter (Signed)
Pt was in ED last night and has appt with Rudi Cocoonna Carroll tomorrow, he has a question regarding the increase of his Metoprolol Succinate from 12.5 mg BID to 25mg  BID-pls call pt at [phone]

## 2016-09-04 NOTE — Telephone Encounter (Signed)
Reviewed with Dr. Graciela HusbandsKlein- patient will need follow up in the a-fib clinic in about a weeks time. He will most likely need antiarrhythmic therapy. Melissa, scheduler, aware and will contact the patient to schedule.

## 2016-09-05 ENCOUNTER — Telehealth (HOSPITAL_COMMUNITY): Payer: Self-pay | Admitting: *Deleted

## 2016-09-05 ENCOUNTER — Other Ambulatory Visit: Payer: Self-pay

## 2016-09-05 ENCOUNTER — Encounter (HOSPITAL_COMMUNITY): Payer: Self-pay | Admitting: Nurse Practitioner

## 2016-09-05 ENCOUNTER — Ambulatory Visit (HOSPITAL_COMMUNITY)
Admission: RE | Admit: 2016-09-05 | Discharge: 2016-09-05 | Disposition: A | Discharge status: Home / Self Care | Payer: BC Managed Care – PPO | Source: Ambulatory Visit | Attending: Nurse Practitioner | Admitting: Nurse Practitioner

## 2016-09-05 VITALS — BP 126/84 | HR 95 | Ht 77.0 in | Wt 191.2 lb

## 2016-09-05 DIAGNOSIS — Z8249 Family history of ischemic heart disease and other diseases of the circulatory system: Secondary | ICD-10-CM | POA: Insufficient documentation

## 2016-09-05 DIAGNOSIS — Z823 Family history of stroke: Secondary | ICD-10-CM | POA: Insufficient documentation

## 2016-09-05 DIAGNOSIS — Z79899 Other long term (current) drug therapy: Secondary | ICD-10-CM | POA: Insufficient documentation

## 2016-09-05 DIAGNOSIS — I480 Paroxysmal atrial fibrillation: Secondary | ICD-10-CM

## 2016-09-05 DIAGNOSIS — I4891 Unspecified atrial fibrillation: Secondary | ICD-10-CM | POA: Diagnosis not present

## 2016-09-05 DIAGNOSIS — Z800 Family history of malignant neoplasm of digestive organs: Secondary | ICD-10-CM | POA: Insufficient documentation

## 2016-09-05 DIAGNOSIS — Z9889 Other specified postprocedural states: Secondary | ICD-10-CM | POA: Diagnosis not present

## 2016-09-05 DIAGNOSIS — I341 Nonrheumatic mitral (valve) prolapse: Secondary | ICD-10-CM | POA: Insufficient documentation

## 2016-09-05 DIAGNOSIS — Z7901 Long term (current) use of anticoagulants: Secondary | ICD-10-CM | POA: Insufficient documentation

## 2016-09-05 MED ORDER — METOPROLOL SUCCINATE ER 25 MG PO TB24: 25.0000 mg | ORAL_TABLET | Freq: Every day | ORAL | 0 refills | Status: DC

## 2016-09-05 MED ORDER — DILTIAZEM HCL 30 MG PO TABS: ORAL_TABLET | ORAL | 3 refills | Status: DC

## 2016-09-05 NOTE — Telephone Encounter (Signed)
I spoke to Rudi Cocoonna Carroll, NP re pt metoprolol.  She clarified that pt was to be instructed to take 25 mg of metoprolol daily not the 12.5mg  that the patient was planning to.   I called the patient and instructed that starting tomorrow since he has already taken meds today that he should take the full 25 mg metoprolol tablet. Pt understood instructions.

## 2016-09-05 NOTE — Addendum Note (Signed)
Encounter addended by: Newman Niponna C Shavonte Zhao, NP on: 09/05/2016 12:56 PM<BR>    Actions taken: LOS modified

## 2016-09-05 NOTE — Patient Instructions (Signed)
Your physician has recommended you make the following change in your medication:  1)Metoprolol (toprol) 25mg  once a day  2)Cardizem 30mg  -- take 1 tablet every 4 hours AS NEEDED for AFIB heart rate >100 as long as blood pressure >100.

## 2016-09-05 NOTE — Telephone Encounter (Signed)
Patient was seen by Rudi Cocoonna Carroll, NP today.

## 2016-09-05 NOTE — Progress Notes (Signed)
Primary Care Physician: Rudi HeapMOORE, [name], MD Referring Physician: [facility] f/u Cardiologist: Berta MinorKlein   Charles Rasmussen is a 46 y.o. male with a h/o paroxsymal afib that has recently been documented after having short bursts of palpitations for some time. It was  dx with a ER visit 07/20/16, showing afib, and pt required cardioversion x 2 in the ER. He was placed on metoprolol 12.5 mg bid. He ws trying to wean off drug and had not had any BB the day of his next afib episode 2/21 and presented with afib with rvr at 170 bpm  In the late afternoon. No known trigger.Again required cardioversion. He was placed on 25 mg bid of toprol and again will require DAOC x 30 days with a CHA2DS2VASc score of 0.  In the afib office, he is anxious and has many questions for best way to mange his symptoms. He does not drink alcohol, no caffeine, no snoring history and no tobacco products. He has been maintaining SR since /c from the ER.  Today, he denies symptoms of palpitations, chest pain, shortness of breath, orthopnea, PND, lower extremity edema, dizziness, presyncope, syncope, or neurologic sequela. The patient is tolerating medications without difficulties and is otherwise without complaint today.   Past Medical History:  Diagnosis Date  . Atrial fibrillation (HCC)   . Hemorrhoid thrombosis   . Mitral valve prolapse   . Unspecified hemorrhoids without mention of complication 2006  . Venous reflux 01/02/12   deep right per Quita SkyeJames D. Hart RochesterLawson, M.D.   Past Surgical History:  Procedure Laterality Date  . MOUTH SURGERY     wisdom teeth  . neg hx      Current Outpatient Prescriptions  Medication Sig Dispense Refill  . Artificial Tear Solution (TEARS RENEWED OP) Place 1-2 drops into both eyes 3 (three) times daily as needed (for dry eyes).    . metoprolol succinate (TOPROL-XL) 25 MG 24 hr tablet Take 1 tablet (25 mg total) by mouth daily. 30 tablet 0  . rivaroxaban (XARELTO) 20 MG TABS tablet Take 1 tablet (20 mg  total) by mouth daily with supper. 30 tablet 0  . diltiazem (CARDIZEM) 30 MG tablet Take 1 tablet every 4 hours AS NEEDED for AFIB heart rate >100 as long as blood pressure >100. 45 tablet 3   No current facility-administered medications for this encounter.     No Known Allergies  Social History   Social History  . Marital status: Single    Spouse name: N/A  . Number of children: 0  . Years of education: N/A   Occupational History  .  [employer]   Social History Main Topics  . Smoking status: Never Smoker  . Smokeless tobacco: Never Used  . Alcohol use No  . Drug use: No  . Sexual activity: Not on file   Other Topics Concern  . Not on file   Social History Narrative  . No narrative on file    Family History  Problem Relation Age of Onset  . Colon cancer Father 8160  . Rectal cancer Father   . Hypertension Father   . Colon cancer Maternal Grandfather 80  . Rectal cancer Paternal Grandfather   . Heart attack Paternal Uncle   . Stroke Neg Hx     ROS- All systems are reviewed and negative except as per the HPI above  Physical Exam: Vitals:   09/05/16 0937  BP: 126/84  Pulse: 95  Weight: 191 lb 3.2 oz (86.7  kg)  Height: 6\' 5"  (1.956 m)   Wt Readings from Last 3 Encounters:  09/05/16 191 lb 3.2 oz (86.7 kg)  09/03/16 190 lb (86.2 kg)  07/22/16 194 lb (88 kg)    Labs: Lab Results  Component Value Date   NA 138 09/03/2016   K 3.5 09/03/2016   CL 107 09/03/2016   CO2 21 (L) 09/03/2016   GLUCOSE 125 (H) 09/03/2016   BUN 12 09/03/2016   CREATININE 0.93 09/03/2016   CALCIUM 9.8 09/03/2016   MG 2.0 09/03/2016   No results found for: INR No results found for: CHOL, HDL, LDLCALC, TRIG   GEN- The patient is well appearing, alert and oriented x 3 today.   Head- normocephalic, atraumatic Eyes-  Sclera clear, conjunctiva pink Ears- hearing intact Oropharynx- clear Neck- supple, no JVP Lymph- no cervical lymphadenopathy Lungs- Clear to  ausculation bilaterally, normal work of breathing Heart- Regular rate and rhythm, no murmurs, rubs or gallops, PMI not laterally displaced GI- soft, NT, ND, + BS Extremities- no clubbing, cyanosis, or edema MS- no significant deformity or atrophy Skin- no rash or lesion Psych- euthymic mood, full affect Neuro- strength and sensation are intact  EKG-NSR  @95  bpm,Pr int 160 ms, qrs int 88 ms, qtc 432 ms Echo-Study Conclusions  - Left ventricle: The cavity size was normal. There was mild   concentric hypertrophy. Systolic function was normal. The   estimated ejection fraction was in the range of 55% to 60%. Wall   motion was normal; there were no regional wall motion   abnormalities. Left ventricular diastolic function parameters   were normal. - Aortic valve: Transvalvular velocity was within the normal range.   There was no stenosis. There was no regurgitation. - Mitral valve: Thickening and elongation of the anterior leaflet.   Transvalvular velocity was within the normal range. There was no   evidence for stenosis. There was mild regurgitation. - Right ventricle: The cavity size was normal. Wall thickness was   normal. Systolic function was normal. - Tricuspid valve: There was trivial regurgitation. - Pulmonary arteries: Systolic pressure was within the normal   range.    Assessment and Plan: 1. Paroxsymal afib General education re afib management and how to manage at home to avoid ER visits 30 mg cardizem pill in pocket given to pt. SInce he had been only on toprol 12.5 mg daily and none the day of his last episode, he will take 25 mg daily instead of bid. IF does not mange afib,  will increase to 25 mg bid.  Continue wearing 30 day event monitor Finish 30 day DOAC, bu then will not need DOAC long term due to CHA2DS2VASc score of 0  F/u in 2 weeks  Lupita Leash C. [facility] [address] [place], [place] [address] [phone]

## 2016-09-05 NOTE — Telephone Encounter (Signed)
Pt cld stating that he kept feeling a fluttering and that he checked his bp and it was 126/82 and he has an avg HR of 85-90 that was irregular. Pt stated that he was anxious that he was going to go back into full afib like he had in the ER and had already taken his daily dose of metoprolol.  Pt was instructed to take the cardizem 30 mg and recheck vitals in 2 hrs. Pt understood.  He asked if he should take a second dose of metoprolol tonight 12.5 mg just as maintenance.  Pt was instructed since Rudi Cocoonna Carroll, NP and he had discussed taking 12.5 mg bid, if he takes his bp tonight since taking the cardizem and the systolic is above 100 he should go ahead and take the dose agreed upon.  Pt understood to check vitals first since he has taken the cardizem. Pt will call [address] office for after hours.

## 2016-09-08 ENCOUNTER — Ambulatory Visit: Payer: Self-pay | Admitting: Internal Medicine

## 2016-09-18 ENCOUNTER — Ambulatory Visit (HOSPITAL_COMMUNITY)
Admission: RE | Admit: 2016-09-18 | Discharge: 2016-09-18 | Disposition: A | Discharge status: Home / Self Care | Payer: BC Managed Care – PPO | Source: Ambulatory Visit | Attending: Nurse Practitioner | Admitting: Nurse Practitioner

## 2016-09-18 ENCOUNTER — Encounter (HOSPITAL_COMMUNITY): Payer: Self-pay | Admitting: Nurse Practitioner

## 2016-09-18 VITALS — BP 130/84 | HR 81 | Ht 77.0 in | Wt 193.8 lb

## 2016-09-18 DIAGNOSIS — Z79899 Other long term (current) drug therapy: Secondary | ICD-10-CM | POA: Insufficient documentation

## 2016-09-18 DIAGNOSIS — Z9889 Other specified postprocedural states: Secondary | ICD-10-CM | POA: Diagnosis not present

## 2016-09-18 DIAGNOSIS — I480 Paroxysmal atrial fibrillation: Secondary | ICD-10-CM | POA: Insufficient documentation

## 2016-09-18 DIAGNOSIS — I341 Nonrheumatic mitral (valve) prolapse: Secondary | ICD-10-CM | POA: Insufficient documentation

## 2016-09-18 DIAGNOSIS — Z800 Family history of malignant neoplasm of digestive organs: Secondary | ICD-10-CM | POA: Insufficient documentation

## 2016-09-18 DIAGNOSIS — Z8249 Family history of ischemic heart disease and other diseases of the circulatory system: Secondary | ICD-10-CM | POA: Diagnosis not present

## 2016-09-18 DIAGNOSIS — Z7901 Long term (current) use of anticoagulants: Secondary | ICD-10-CM | POA: Insufficient documentation

## 2016-09-18 NOTE — Progress Notes (Signed)
Primary Care Physician: Rudi HeapMOORE, [name], MD [facility] Cardiologist: Charles Rasmussen   Charles Rasmussen is a 46 y.o. male with a h/o paroxsymal afib that has recently been documented after having short bursts of palpitations for some time. It was  dx with a ER visit 07/20/16, showing afib, and pt required cardioversion x 2 in the ER. He was placed on Charles 12.5 mg bid. He ws trying to wean off drug and had not had any BB the day of his next afib episode 2/21 and presented with afib with rvr at 170 bpm  In the late afternoon. No known trigger.Again required cardioversion. He was placed on 25 mg bid of toprol and again will require DAOC x 30 days with a CHA2DS2VASc score of 0.  In the afib office, he is anxious and has many questions for best way to mange his symptoms. He does not drink alcohol, no caffeine, no snoring history and no tobacco products. He has been maintaining SR since /c from the ER.  F/u in the afib clinic, 3/8, he reports that he has had one episode of afib for which he felt that he needed 30 mg Cardizem and the episode was over soon. He continues to have some palps as well. Continues to wear 30 day monitor.Continues on DOAC.  Today, he denies symptoms of palpitations, chest pain, shortness of breath, orthopnea, PND, lower extremity edema, dizziness, presyncope, syncope, or neurologic sequela. The patient is tolerating medications without difficulties and is otherwise without complaint today.   Past Medical History:  Diagnosis Date  . Atrial fibrillation (HCC)   . Hemorrhoid thrombosis   . Mitral valve prolapse   . Unspecified hemorrhoids without mention of complication 2006  . Venous reflux 01/02/12   deep right per Charles Rasmussen, M.D.   Past Surgical History:  Procedure Laterality Date  . MOUTH SURGERY     wisdom teeth  . neg hx      Current Outpatient Prescriptions  Medication Sig Dispense Refill  . Artificial Tear Solution (TEARS RENEWED OP) Place 1-2  drops into both eyes 3 (three) times daily as needed (for dry eyes).    Charles Rasmussen Take 1 Rasmussen every 4 hours AS NEEDED for AFIB heart rate >100 as long as blood pressure >100. 45 Rasmussen 3  . Charles Rasmussen Take 1 Rasmussen (25 mg total) by mouth daily. 30 Rasmussen 0  . Charles Rasmussen Take 1 Rasmussen (20 mg total) by mouth daily with supper. 30 Rasmussen 0   No current facility-administered medications for this encounter.     No Known Allergies  Social History   Social History  . Marital status: Single    Spouse name: N/A  . Number of children: 0  . Years of education: N/A   Occupational History  .  [employer]   Social History Main Topics  . Smoking status: Never Smoker  . Smokeless tobacco: Never Used  . Alcohol use No  . Drug use: No  . Sexual activity: Not on file   Other Topics Concern  . Not on file   Social History Narrative  . No narrative on file    Family History  Problem Relation Age of Onset  . Colon cancer Father [id number]  . Rectal cancer Father   . Hypertension Father   . Colon cancer Maternal Grandfather 80  . Rectal cancer Paternal Grandfather   .  Heart attack Paternal Uncle   . Stroke Neg Hx     ROS- All systems are reviewed and negative except as per the HPI above  Physical Exam: Vitals:   09/18/16 1512  BP: 130/84  Pulse: 81  Weight: 193 lb 12.8 oz (87.9 kg)  Height: 6\' 5"  (1.956 m)   Wt Readings from Last 3 Encounters:  09/18/16 193 lb 12.8 oz (87.9 kg)  09/05/16 191 lb 3.2 oz (86.7 kg)  09/03/16 190 lb (86.2 kg)    Labs: Lab Results  Component Value Date   NA 138 09/03/2016   K 3.5 09/03/2016   CL 107 09/03/2016   CO2 21 (L) 09/03/2016   GLUCOSE 125 (H) 09/03/2016   BUN 12 09/03/2016   CREATININE 0.93 09/03/2016   CALCIUM 9.8 09/03/2016   MG 2.0 09/03/2016   No results found for: INR No results found for: CHOL, HDL, LDLCALC,  TRIG   GEN- The patient is well appearing, alert and oriented x 3 today.   Head- normocephalic, atraumatic Eyes-  Sclera clear, conjunctiva pink Ears- hearing intact Oropharynx- clear Neck- supple, no JVP Lymph- no cervical lymphadenopathy Lungs- Clear to ausculation bilaterally, normal work of breathing Heart- Regular rate and rhythm, no murmurs, rubs or gallops, PMI not laterally displaced GI- soft, NT, ND, + BS Extremities- no clubbing, cyanosis, or edema MS- no significant deformity or atrophy Skin- no rash or lesion Psych- euthymic mood, full affect Neuro- strength and sensation are intact  EKG-NSR  @95  bpm,Pr int 160 ms, qrs int 88 ms, qtc 432 ms Echo-Study Conclusions  - Left ventricle: The cavity size was normal. There was mild   concentric hypertrophy. Systolic function was normal. The   estimated ejection fraction was in the range of 55% to 60%. Wall   motion was normal; there were no regional wall motion   abnormalities. Left ventricular diastolic function parameters   were normal. - Aortic valve: Transvalvular velocity was within the normal range.   There was no stenosis. There was no regurgitation. - Mitral valve: Thickening and elongation of the anterior leaflet.   Transvalvular velocity was within the normal range. There was no   evidence for stenosis. There was mild regurgitation. - Right ventricle: The cavity size was normal. Wall thickness was   normal. Systolic function was normal. - Tricuspid valve: There was trivial regurgitation. - Pulmonary arteries: Systolic pressure was within the normal   range.    Assessment and Plan: 1. Paroxsymal afib General education re afib management and how to manage at home to avoid ER visits 30 mg cardizem pill in pocket as needed Continue Charles ER 25 mg a day and takes another 12.5 mg daily if feels that he is having extra palpitations Continue wearing 30 day event monitor Finish 30 day DOAC, bu then will not  need DOAC long term due to CHA2DS2VASc score of 0  F/u in 1 month  Lupita Leash C. [facility] [address] [place], [place] [address] [phone]

## 2016-09-22 ENCOUNTER — Other Ambulatory Visit (HOSPITAL_COMMUNITY): Payer: Self-pay | Admitting: *Deleted

## 2016-09-22 MED ORDER — METOPROLOL SUCCINATE ER 25 MG PO TB24: 25.0000 mg | ORAL_TABLET | Freq: Every day | ORAL | 1 refills | Status: DC

## 2016-10-03 ENCOUNTER — Telehealth: Payer: Self-pay | Admitting: Internal Medicine

## 2016-10-03 NOTE — Telephone Encounter (Signed)
New message    Pt calling for results to Echo and other tests he had done,pt believes that he had a appt , but there is none on the schedule for him

## 2016-10-03 NOTE — Telephone Encounter (Signed)
Dr. Graciela HusbandsKlein,   Please review echo and event (in your studies to read).  The patient has a follow up with Rudi Cocoonna Carroll, NP on 10/16/16. Any recommendations based on results?

## 2016-10-06 NOTE — Telephone Encounter (Signed)
Charles Rasmussen  Good am  You are scheduled to see this young and very anxious man next week Might be worth trying pill in the pocket as had to be DCCV a few weeks ago for Afib Thanks

## 2016-10-16 ENCOUNTER — Ambulatory Visit (HOSPITAL_COMMUNITY)
Admission: RE | Admit: 2016-10-16 | Discharge: 2016-10-16 | Disposition: A | Discharge status: Home / Self Care | Payer: BC Managed Care – PPO | Source: Ambulatory Visit | Attending: Nurse Practitioner | Admitting: Nurse Practitioner

## 2016-10-16 ENCOUNTER — Encounter (HOSPITAL_COMMUNITY): Payer: Self-pay | Admitting: Nurse Practitioner

## 2016-10-16 VITALS — BP 118/78 | HR 72 | Ht 77.0 in | Wt 193.8 lb

## 2016-10-16 DIAGNOSIS — I341 Nonrheumatic mitral (valve) prolapse: Secondary | ICD-10-CM | POA: Diagnosis not present

## 2016-10-16 DIAGNOSIS — Z800 Family history of malignant neoplasm of digestive organs: Secondary | ICD-10-CM | POA: Diagnosis not present

## 2016-10-16 DIAGNOSIS — Z8249 Family history of ischemic heart disease and other diseases of the circulatory system: Secondary | ICD-10-CM | POA: Diagnosis not present

## 2016-10-16 DIAGNOSIS — Z823 Family history of stroke: Secondary | ICD-10-CM | POA: Diagnosis not present

## 2016-10-16 DIAGNOSIS — Z79899 Other long term (current) drug therapy: Secondary | ICD-10-CM | POA: Insufficient documentation

## 2016-10-16 DIAGNOSIS — I480 Paroxysmal atrial fibrillation: Secondary | ICD-10-CM | POA: Diagnosis present

## 2016-10-16 NOTE — Progress Notes (Signed)
Primary Care Physician: [name], MD Referring Physician: [facility] ER f/u Cardiologist: Berta Minor Charles Rasmussen is a 46 y.o. male with a h/o paroxsymal afib that has recently been documented after having short bursts of palpitations for some time. It was  dx with a ER visit 07/20/16, showing afib, and pt required cardioversion x 2 in the ER. He was placed on metoprolol 12.5 mg bid. He ws trying to wean off drug and had not had any BB the day of his next afib episode 2/21 and presented with afib with rvr at 170 bpm  In the late afternoon. No known trigger.Again required cardioversion. He was placed on 25 mg bid of toprol and again will require DAOC x 30 days with a CHA2DS2VASc score of 0.  In the afib office, he is anxious and has many questions for best way to mange his symptoms. He does not drink alcohol, no caffeine, no snoring history and no tobacco products. He has been maintaining SR since /c from the ER.  F/u in the afib clinic, 3/8, he reports that he has had one episode of afib for which he felt that he needed 30 mg Cardizem and the episode was over soon. He continues to have some palps as well. Continues to wear 30 day monitor.Continues on DOAC.  F/u in afib clinic. Afib has been quiet. Just a flutter here and there lasting 3 seconds. He is on metoprolol er 25 mg am another 1/2 tab as needed in the pm. He appears to be anxious with drugs. He is glad though that his afib had been quiet and he is starting to relax a little. He does have cardizem 30 mg to use if needed. We discussed daily flecainide if needed but I think  it is not needed at this time and I think pill in pocket flecainide, pt reading the drug insert and the possible side effects, would make him more anxious as he has a tendency to be very anxious.Review of his monitor did show the one episode of afib that he presented to the ER with, as well as a lot of pac's and pvc's. 25 patient triggers that correlated with PVC's.  Today, he  denies symptoms of palpitations, chest pain, shortness of breath, orthopnea, PND, lower extremity edema, dizziness, presyncope, syncope, or neurologic sequela. The patient is tolerating medications without difficulties and is otherwise without complaint today.   Past Medical History:  Diagnosis Date  . Atrial fibrillation (HCC)   . Hemorrhoid thrombosis   . Mitral valve prolapse   . Unspecified hemorrhoids without mention of complication 2006  . Venous reflux 01/02/12   deep right per [name]. Hart Rochester, M.D.   Past Surgical History:  Procedure Laterality Date  . MOUTH SURGERY     wisdom teeth  . neg hx      Current Outpatient Prescriptions  Medication Sig Dispense Refill  . Artificial Tear Solution (TEARS RENEWED OP) Place 1-2 drops into both eyes 3 (three) times daily as needed (for dry eyes).    Marland Kitchen diltiazem (CARDIZEM) 30 MG tablet Take 1 tablet every 4 hours AS NEEDED for AFIB heart rate >100 as long as blood pressure >100. 45 tablet 3  . metoprolol succinate (TOPROL-XL) 25 MG 24 hr tablet Take 1 tablet (25 mg total) by mouth daily. (Patient taking differently: Take 25 mg by mouth daily. One tablet in the morning and 1/2 tablet in the evening) 90 tablet 1   No current facility-administered medications for  this encounter.     No Known Allergies  Social History   Social History  . Marital status: Single    Spouse name: N/A  . Number of children: 0  . Years of education: N/A   Occupational History  .  [employer]   Social History Main Topics  . Smoking status: Never Smoker  . Smokeless tobacco: Never Used  . Alcohol use No  . Drug use: No  . Sexual activity: Not on file   Other Topics Concern  . Not on file   Social History Narrative  . No narrative on file    Family History  Problem Relation Age of Onset  . Colon cancer Father 2  . Rectal cancer Father   . Hypertension Father   . Colon cancer Maternal Grandfather 80  . Rectal cancer Paternal  Grandfather   . Heart attack Paternal Uncle   . Stroke Neg Hx     ROS- All systems are reviewed and negative except as per the HPI above  Physical Exam: Vitals:   10/16/16 1541  BP: 118/78  Pulse: 72  Weight: 193 lb 12.8 oz (87.9 kg)  Height:  (1.956 m)   Wt Readings from Last 3 Encounters:  10/16/16 193 lb 12.8 oz (87.9 kg)  09/18/16 193 lb 12.8 oz (87.9 kg)  09/05/16 191 lb 3.2 oz (86.7 kg)    Labs: Lab Results  Component Value Date   NA 138 09/03/2016   K 3.5 09/03/2016   CL 107 09/03/2016   CO2 21 (L) 09/03/2016   GLUCOSE 125 (H) 09/03/2016   BUN 12 09/03/2016   CREATININE 0.93 09/03/2016   CALCIUM 9.8 09/03/2016   MG 2.0 09/03/2016   No results found for: INR No results found for: CHOL, HDL, LDLCALC, TRIG   GEN- The patient is well appearing, alert and oriented x 3 today.   Head- normocephalic, atraumatic Eyes-  Sclera clear, conjunctiva pink Ears- hearing intact Oropharynx- clear Neck- supple, no JVP Lymph- no cervical lymphadenopathy Lungs- Clear to ausculation bilaterally, normal work of breathing Heart- Regular rate and rhythm, no murmurs, rubs or gallops, PMI not laterally displaced GI- soft, NT, ND, + BS Extremities- no clubbing, cyanosis, or edema MS- no significant deformity or atrophy Skin- no rash or lesion Psych- euthymic mood, full affect Neuro- strength and sensation are intact  EKG-NSR at 75 bpm, pr int 162 ms, qrs int 88 ms, qtc 402 ms Monitor results reviewed , see HPI Echo-Study Conclusions  - Left ventricle: The cavity size was normal. There was mild   concentric hypertrophy. Systolic function was normal. The   estimated ejection fraction was in the range of 55% to 60%. Wall   motion was normal; there were no regional wall motion   abnormalities. Left ventricular diastolic function parameters   were normal. - Aortic valve: Transvalvular velocity was within the normal range.   There was no stenosis. There was no  regurgitation. - Mitral valve: Thickening and elongation of the anterior leaflet.   Transvalvular velocity was within the normal range. There was no   evidence for stenosis. There was mild regurgitation. - Right ventricle: The cavity size was normal. Wall thickness was   normal. Systolic function was normal. - Tricuspid valve: There was trivial regurgitation. - Pulmonary arteries: Systolic pressure was within the normal   range.    Assessment and Plan: 1. Paroxsymal afib General education re afib management and how to manage at home to avoid ER visits 30  mg cardizem pill in pocket as needed for rapid episodes afib Continue metoprolol ER 25 mg a day and take another 12.5 mg daily if feels that he is having extra palpitations Monitor showed mostly PAC's/PVC's He is very anxious and was reassured re monitor findings I am not going to prescribe any flecainide at this point for concern of pt's anxiety re drugs,I answered  5 more questions re BB today I did say that this would be a drug we would consider if afib burden increased  F/u with Dr. Graciela Husbands in August afib as clinic  Elvina Sidle. [facility] [address] [place], [place] [address] [phone]

## 2016-10-21 ENCOUNTER — Ambulatory Visit (INDEPENDENT_AMBULATORY_CARE_PROVIDER_SITE_OTHER): Payer: BC Managed Care – PPO | Admitting: Physician Assistant

## 2016-10-21 ENCOUNTER — Encounter: Payer: Self-pay | Admitting: Physician Assistant

## 2016-10-21 VITALS — BP 100/70 | HR 73 | Ht 77.0 in | Wt 198.0 lb

## 2016-10-21 DIAGNOSIS — Z800 Family history of malignant neoplasm of digestive organs: Secondary | ICD-10-CM

## 2016-10-21 DIAGNOSIS — K5900 Constipation, unspecified: Secondary | ICD-10-CM

## 2016-10-21 MED ORDER — NA SULFATE-K SULFATE-MG SULF 17.5-3.13-1.6 GM/177ML PO SOLN: 1.0000 | ORAL | 0 refills | Status: DC

## 2016-10-21 NOTE — Patient Instructions (Addendum)
You have been scheduled for a colonoscopy. Please follow written instructions given to you at your visit today.  Please pick up your prep supplies at the pharmacy within the next 1-3 days. If you use inhalers (even only as needed), please bring them with you on the day of your procedure. Your physician has requested that you go to www.startemmi.com and enter the access code given to you at your visit today. This web site gives a general overview about your procedure. However, you should still follow specific instructions given to you by our office regarding your preparation for the procedure.  Please purchase the following medications over the counter and take as directed:  Miralax 17 grams in 8 oz of water daily.   At least 60 oz of water daily.   Continue Metamucil daily.

## 2016-10-22 NOTE — Progress Notes (Addendum)
Subjective:    Patient ID: Charles Rasmussen, male    DOB: 05-17-1971, 46 y.o.   MRN: [id number]  HPI Charles Rasmussen is a pleasant 46 year old white male known to Dr. Carlean Purl from previous colonoscopy. He was last seen in our office in 2015 with a thrombosed hemorrhoid. Last colonoscopy was done in 2006 which was a negative exam with exception of internal and sternal hemorrhoids. Patient does have family history of colorectal cancer in his father diagnosed in his early 41s. Patient has history of mitral valve prolapse previous prostatitis and has had a fairly new diagnosis of atrial fibrillation. He is being followed by Dr. Jens Som and actually has undergone a recent cardioversion. Appendectomy is very anxious about the atrial fibrillation. He comes in today to discuss issues with constipation. He was started on metoprolol over the past month because of the atrial fibrillation and says she started having problems with constipation then. Says he is having very small hard stools on and generally just passing small amounts of stool at a time. He has not noticed any melena or hematochezia. He has no complaints of abdominal pain. He has been taking Metamucil daily long-term that without any benefit recently. He has tried some prune juice, couple of doses of MiraLAX and some senna but nothing on a regular basis and continues to feel constipated. He is passing some stool most days but again just feels like he is not evacuating his bowel.  Review of Systems Pertinent positive and negative review of systems were noted in the above HPI section.  All other review of systems was otherwise negative.  Outpatient Encounter Prescriptions as of 10/21/2016  Medication Sig  . Artificial Tear Solution (TEARS RENEWED OP) Place 1-2 drops into both eyes 3 (three) times daily as needed (for dry eyes).  Marland Kitchen diltiazem (CARDIZEM) 30 MG tablet Take 1 tablet every 4 hours AS NEEDED for AFIB heart rate >100 as long as blood pressure >100.  .  metoprolol succinate (TOPROL-XL) 25 MG 24 hr tablet Take 1 tablet (25 mg total) by mouth daily. (Patient taking differently: Take 25 mg by mouth daily. One tablet in the morning and 1/2 tablet in the evening)  . Na Sulfate-K Sulfate-Mg Sulf (SUPREP BOWEL PREP KIT) 17.5-3.13-1.6 GM/180ML SOLN Take 1 kit by mouth as directed.   No facility-administered encounter medications on file as of 10/21/2016.    No Known Allergies Patient Active Problem List   Diagnosis Date Noted  . Palpitations 01/10/2015  . 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 PVC (premature ventricular contraction) 02/19/2014  . Pectus excavatum 11/07/2013  . Thrombosed external hemorrhoid 12/21/2012  . Chronic prostatitis 10/28/2012  . Hyperlipidemia 10/28/2012  . MVP (mitral valve prolapse) 10/28/2012  . Varicose veins of lower extremities with other complications 94/70/9628   Social History   Social History  . Marital status: Single    Spouse name: N/A  . Number of children: 0  . Years of education: N/A   Occupational History  .  [employer] History Main Topics  . Smoking status: Never Smoker  . Smokeless tobacco: Never Used  . Alcohol use No  . Drug use: No  . Sexual activity: Not on file   Other Topics Concern  . Not on file   Social History Narrative  . No narrative on file    Mr. Grider family history includes Colon cancer (age of onset: 51) in his father; Colon cancer (age of onset: 30) in his maternal grandfather; Heart attack  in his paternal uncle; Hypertension in his father; Rectal cancer in his father and paternal grandfather.      Objective:    Vitals:   10/21/16 0849  BP: 100/70  Pulse: 73    Physical Exam  well-developed white male in no acute distress, anxious blood pressure 100/70 pulse 73, height 6 foot 5, weight 198, BMI 23.4. HEENT; nontraumatic normocephalic EOMI PERRLA sclera anicteric, Cardiovascular; regular rate and rhythm with S1-S2 no murmur rub or gallop,  Pulmonary; clear bilaterally, Abdomen ;soft, nontender nondistended bowel sounds are active there is no palpable mass or hepatosplenomegaly, Rectal; exam not done, Extremities ;no clubbing cyanosis or edema skin warm and dry, Neuropsych; mood and affect appropriate       Assessment & Plan:   #26 46 year old white male with new constipation likely medication induced with metoprolol. #2 history of atrial fibrillation status post fairly recent cardioversion #3 history of mitral valve prolapse #4 internal and sternal hemorrhoids #5 positive family history of rectal cancer in patient's father diagnosed in his early 18s  Plan; patient should have follow-up colonoscopy. This was discussed in detail with the patient today and he is agreeable to proceed. We will schedule for colonoscopy with Dr. Carlean Purl. Encouraged him to continue to push his water intake, continue Benefiber or Metamucil on a daily basis. Prune juice may be helpful if he drinks it every day. Also suggested he try taking 17 g of MiraLAX in 8 ounces of water on a daily basis, and then titrate as he needs to to have a daily bowel movement.  Ricarda Atayde S Monisha Siebel PA-C 10/22/2016   Cc: Chipper Herb, MD   Agree with Ms. Genia Harold assessment and plan. Gatha Mayer, MD, Marval Regal

## 2016-10-22 NOTE — Progress Notes (Signed)
Subjective:    Patient ID: Charles Rasmussen, male    DOB: [birth year], 46 y.o.   MRN: [id number]  HPI Charles Rasmussen is a pleasant 46 year old white male known to Dr. Carlean Purl from previous procedures who comes in today to discuss constipation. He had last been seen in our office in 2015 at which time he had a thrombosed hemorrhoid. He was to schedule colonoscopy at that time but did not follow through. Patient last had colonoscopy in 2006 with finding of internal and external hemorrhoids no polyps. He has family history of rectal cancer in his father  Review of Systems Pertinent positive and negative review of systems were noted in the above HPI section.  All other review of systems was otherwise negative.  Outpatient Encounter Prescriptions as of 10/21/2016  Medication Sig  . Artificial Tear Solution (TEARS RENEWED OP) Place 1-2 drops into both eyes 3 (three) times daily as needed (for dry eyes).  Marland Kitchen diltiazem (CARDIZEM) 30 MG tablet Take 1 tablet every 4 hours AS NEEDED for AFIB heart rate >100 as long as blood pressure >100.  . metoprolol succinate (TOPROL-XL) 25 MG 24 hr tablet Take 1 tablet (25 mg total) by mouth daily. (Patient taking differently: Take 25 mg by mouth daily. One tablet in the morning and 1/2 tablet in the evening)  . Na Sulfate-K Sulfate-Mg Sulf (SUPREP BOWEL PREP KIT) 17.5-3.13-1.6 GM/180ML SOLN Take 1 kit by mouth as directed.   No facility-administered encounter medications on file as of 10/21/2016.    No Known Allergies Patient Active Problem List   Diagnosis Date Noted  . Palpitations 01/10/2015  . 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 PVC (premature ventricular contraction) 02/19/2014  . Pectus excavatum 11/07/2013  . Thrombosed external hemorrhoid 12/21/2012  . Chronic prostatitis 10/28/2012  . Hyperlipidemia 10/28/2012  . MVP (mitral valve prolapse) 10/28/2012  . Varicose veins of lower extremities with other complications 32/44/0102   Social History   Social History    . Marital status: Single    Spouse name: N/A  . Number of children: 0  . Years of education: N/A   Occupational History  .  [employer] Main Topics  . Smoking status: Never Smoker  . Smokeless tobacco: Never Used  . Alcohol use No  . Drug use: No  . Sexual activity: Not on file   Other Topics Concern  . Not on file   Social History Narrative  . No narrative on file    Mr. Charles Rasmussen family history includes Colon cancer (age of onset: 3) in his father; Colon cancer (age of onset: 77) in his maternal grandfather; Heart attack in his paternal uncle; Hypertension in his father; Rectal cancer in his father and paternal grandfather.      Objective:    Vitals:   10/21/16 0849  BP: 100/70  Pulse: 73    Physical Exam  well-developed white male in no acute distress, somewhat anxious blood pressure 100/70 pulse 73, Height 6 foot 5, weight 198, BMI 23.4. HEENT; nontraumatic normocephalic EOMI PERRLA sclera anicteric, Cardiovascular ;regular rate and rhythm with S1-S2 no murmur or gallop, Pulmonary; clear bilaterally, Abdomen ;soft, nontender nondistended bowel sounds are active there is no palpable mass or hepatosplenomegaly, Rectal; exam not done, Extremities; no clubbing cyanosis or edema skin warm and dry, Neuropsych ;mood and affect appropriate, patient anxious       Assessment & Plan:     Alfredia Ferguson PA-C 10/22/2016   Cc: Chipper Herb, MD

## 2016-10-24 ENCOUNTER — Ambulatory Visit (INDEPENDENT_AMBULATORY_CARE_PROVIDER_SITE_OTHER): Payer: BC Managed Care – PPO | Admitting: Licensed Clinical Social Worker

## 2016-10-24 DIAGNOSIS — F419 Anxiety disorder, unspecified: Secondary | ICD-10-CM

## 2016-11-11 ENCOUNTER — Telehealth (HOSPITAL_COMMUNITY): Payer: Self-pay | Admitting: *Deleted

## 2016-11-11 MED ORDER — METOPROLOL TARTRATE 25 MG PO TABS: 12.5000 mg | ORAL_TABLET | Freq: Two times a day (BID) | ORAL | 6 refills | Status: DC

## 2016-11-11 NOTE — Telephone Encounter (Signed)
Patient called in stating the toprol is "bogging him down" so he decided to try decreasing the dose to 1/2 tablet daily and trying metoprolol tartrate instead 1/4 tablet BID. He is wanting to know if this is ok to do. Discussed with Rudi Coco NP who recommended he stop toprol (metoprolol succinate) and resume metoprolol tartrate 12.5mg  twice a day. Pt verbalized understanding.

## 2016-12-02 ENCOUNTER — Encounter: Payer: Self-pay | Admitting: Internal Medicine

## 2016-12-12 ENCOUNTER — Telehealth: Payer: Self-pay | Admitting: Internal Medicine

## 2016-12-13 NOTE — Telephone Encounter (Signed)
No charge. 

## 2016-12-16 ENCOUNTER — Encounter: Payer: Self-pay | Admitting: Internal Medicine

## 2016-12-24 ENCOUNTER — Ambulatory Visit (INDEPENDENT_AMBULATORY_CARE_PROVIDER_SITE_OTHER): Payer: BC Managed Care – PPO | Admitting: Family Medicine

## 2016-12-24 ENCOUNTER — Encounter: Payer: Self-pay | Admitting: Family Medicine

## 2016-12-24 VITALS — BP 114/71 | HR 72 | Temp 98.8°F | Ht 77.0 in | Wt 194.0 lb

## 2016-12-24 DIAGNOSIS — Z0000 Encounter for general adult medical examination without abnormal findings: Secondary | ICD-10-CM | POA: Diagnosis not present

## 2016-12-24 DIAGNOSIS — Z800 Family history of malignant neoplasm of digestive organs: Secondary | ICD-10-CM

## 2016-12-24 DIAGNOSIS — I480 Paroxysmal atrial fibrillation: Secondary | ICD-10-CM

## 2016-12-24 DIAGNOSIS — N400 Enlarged prostate without lower urinary tract symptoms: Secondary | ICD-10-CM

## 2016-12-24 DIAGNOSIS — E559 Vitamin D deficiency, unspecified: Secondary | ICD-10-CM

## 2016-12-24 DIAGNOSIS — I341 Nonrheumatic mitral (valve) prolapse: Secondary | ICD-10-CM

## 2016-12-24 DIAGNOSIS — N411 Chronic prostatitis: Secondary | ICD-10-CM

## 2016-12-24 DIAGNOSIS — E7800 Pure hypercholesterolemia, unspecified: Secondary | ICD-10-CM

## 2016-12-24 NOTE — Patient Instructions (Addendum)
Continue current medications. Continue good therapeutic lifestyle changes which include good diet and exercise. Fall precautions discussed with patient. If an FOBT was given today- please return it to our front desk. If you are over 48 years old - you may need Prevnar 13 or the adult Pneumonia vaccine.  **Flu shots are available--- please call and schedule a FLU-CLINIC appointment**  After your visit with us today you will receive a survey in the mail or online from [employer] regarding your care with us. Please take a moment to fill this out. Your feedback is very important to us as you can help us better understand your patient needs as well as improve your experience and satisfaction. WE CARE ABOUT YOU!!!   Follow-up with cardiology as planned Do not forget to make an appointment for your eye exam Continue to avoid caffeine Also do not forget to follow through and get your colonoscopy as requested by Dr. Leone PayorGessner This summer drink plenty of fluids and stay well hydrated

## 2016-12-24 NOTE — Progress Notes (Signed)
Subjective:    Patient ID: Charles Rasmussen, male    DOB: 1970/09/29, 46 y.o.   MRN: [id number]  HPI Patient is here today for annual wellness exam and follow up of chronic medical problems which includes hyperlipidemia. He is taking medication regularly.The patient has had a couple of visits to the ED with a diagnosis of atrial fibrillation. He has an upcoming appointment soon with the cardiologist for follow-up. He had to be defibrillated twice with each episode. He is currently taking metoprolol, a very low dose of 6.25 twice daily and this is work well with controlling any recurrence of the atrophia. He has a diltiazem to take only if needed. He is feeling well overall with no shortness of breath or chest pain. He denies any problems with his stomach other than some constipation and he is taking a generic brand of MiraLAX for this. He denies any blood in the stool or black tarry bowel movements. He is passing his water well currently and has been on doxycycline for about a month and we will check her urine today and if it is clear he will discontinue the doxycycline. The appointment with the cardiologist is with Dr. Caryl Comes and that will happen soon.     Patient Active Problem List   Diagnosis Date Noted  . Palpitations 01/10/2015  . 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 PVC (premature ventricular contraction) 02/19/2014  . Pectus excavatum 11/07/2013  . Thrombosed external hemorrhoid 12/21/2012  . Chronic prostatitis 10/28/2012  . Hyperlipidemia 10/28/2012  . MVP (mitral valve prolapse) 10/28/2012  . Varicose veins of lower extremities with other complications 93/79/0240   Outpatient Encounter Prescriptions as of 12/24/2016  Medication Sig  . Artificial Tear Solution (TEARS RENEWED OP) Place 1-2 drops into both eyes 3 (three) times daily as needed (for dry eyes).  Marland Kitchen diltiazem (CARDIZEM) 30 MG tablet Take 1 tablet every 4 hours AS NEEDED for AFIB heart rate >100 as long as blood pressure >100.    . metoprolol tartrate (LOPRESSOR) 25 MG tablet Take 0.5 tablets (12.5 mg total) by mouth 2 (two) times daily.  . Na Sulfate-K Sulfate-Mg Sulf (SUPREP BOWEL PREP KIT) 17.5-3.13-1.6 GM/180ML SOLN Take 1 kit by mouth as directed. (Patient not taking: Reported on 12/24/2016)   No facility-administered encounter medications on file as of 12/24/2016.       Review of Systems  Constitutional: Negative.   HENT: Negative.   Eyes: Negative.   Respiratory: Negative.   Cardiovascular: Negative.   Gastrointestinal: Negative.   Endocrine: Negative.   Genitourinary: Negative.   Musculoskeletal: Negative.   Skin: Negative.   Allergic/Immunologic: Negative.   Neurological: Negative.   Hematological: Negative.   Psychiatric/Behavioral: Negative.        Objective:   Physical Exam  Constitutional: He is oriented to person, place, and time. He appears well-developed and well-nourished. No distress.  The patient is pleasant and alert  HENT:  Head: Normocephalic and atraumatic.  Right Ear: External ear normal.  Left Ear: External ear normal.  Mouth/Throat: Oropharynx is clear and moist. No oropharyngeal exudate.  Nasal turbinate congestion bilaterally although patient is having no symptoms with this  Eyes: Conjunctivae and EOM are normal. Pupils are equal, round, and reactive to light. Right eye exhibits no discharge. Left eye exhibits no discharge. No scleral icterus.  Patient understands to go and get a good eye exam  Neck: Normal range of motion. Neck supple. No thyromegaly present.  Cardiovascular: Normal rate, regular rhythm, normal heart sounds and intact  distal pulses.   No murmur heard. The heart was 60-72/m with a regular rate and rhythm  Pulmonary/Chest: Effort normal and breath sounds normal. No respiratory distress. He has no wheezes. He has no rales. He exhibits no tenderness.  No axillary adenopathy Clear anteriorly and posteriorly with a pectus excavatum deformity  Abdominal: Soft.  Bowel sounds are normal. He exhibits no mass. There is no tenderness. There is no rebound and no guarding.  No inguinal adenopathy with no liver or spleen enlargement and normal bowel sounds and no masses  Genitourinary: Rectum normal and penis normal.  Genitourinary Comments: The prostate was slightly enlarged but smooth. There were no rectal masses palpable. The external genitalia were within normal limits and no inguinal hernias were palpable.  Musculoskeletal: Normal range of motion. He exhibits no edema.  Lymphadenopathy:    He has no cervical adenopathy.  Neurological: He is alert and oriented to person, place, and time. He has normal reflexes. No cranial nerve deficit.  Skin: Skin is warm and dry. No rash noted.  Psychiatric: He has a normal mood and affect. His behavior is normal. Judgment and thought content normal.  Nursing note and vitals reviewed.  BP 114/71 (BP Location: Left Arm)   Pulse 72   Temp 98.8 F (37.1 C) (Oral)   Ht '6\' 5"'  (1.956 m)   Wt 194 lb (88 kg)   BMI 23.01 kg/m         Assessment & Plan:  1. Annual physical exam -Do not forget to get your colonoscopy scheduled as requested by Dr. Carlean Purl -Follow-up with cardiology as planned - BMP8+EGFR; Future - CBC with Differential/Platelet; Future - Lipid panel; Future - PSA, total and free; Future - VITAMIN D 25 Hydroxy (Vit-D Deficiency, Fractures); Future - Hepatic function panel; Future - Urinalysis, Complete; Future - Urinalysis, Complete  2. Vitamin D deficiency -Continue vitamin D replacement pending results of lab work - CBC with Differential/Platelet; Future - VITAMIN D 25 Hydroxy (Vit-D Deficiency, Fractures); Future  3. Chronic prostatitis -The patient is currently asymptomatic as far as his prostate gland is concerned. He is been taking doxycycline for about a month this time. He has a history of chronic prostatitis and takes this periodically. If the urine is clear today he will not take  anymore antibiotic and we will let him know this as soon as this is reviewed. - CBC with Differential/Platelet; Future - Urinalysis, Complete; Future - Urinalysis, Complete  4. Pure hypercholesterolemia -Continue aggressive therapeutic lifestyle changes - BMP8+EGFR; Future - CBC with Differential/Platelet; Future - Lipid panel; Future - Hepatic function panel; Future  5. MVP (mitral valve prolapse) -Continue follow-up with cardiology  6. Paroxysmal atrial fibrillation (HCC) -Follow-up with Dr. Caryl Comes as planned  7. Benign prostatic hyperplasia without lower urinary tract symptoms -The patient is currently having no symptoms with his prostate.  8. Family history of colon cancer -Both the father and grandfather had rectal carcinoma and I encourage the patient to follow up and get his colonoscopy as requested by the gastroenterologist  Meds ordered this encounter  Medications  . doxycycline (VIBRAMYCIN) 100 MG capsule    Sig: Take 100 mg by mouth 2 (two) times daily.   Patient Instructions  Continue current medications. Continue good therapeutic lifestyle changes which include good diet and exercise. Fall precautions discussed with patient. If an FOBT was given today- please return it to our front desk. If you are over 23 years old - you may need Prevnar 5 or the adult Pneumonia  vaccine.  **Flu shots are available--- please call and schedule a FLU-CLINIC appointment**  After your visit with [place] today you will receive a survey in the mail or online from [employer] regarding your care with [place]. Please take a moment to fill this out. Your feedback is very important to [place] as you can help [place] better understand your patient needs as well as improve your experience and satisfaction. WE CARE ABOUT YOU!!!   Follow-up with cardiology as planned Do not forget to make an appointment for your eye exam Continue to avoid caffeine Also do not forget to follow through and get your colonoscopy as  requested by Dr. Carlean Purl This summer drink plenty of fluids and stay well hydrated  Arrie Senate MD

## 2016-12-30 LAB — MICROSCOPIC EXAMINATION
Bacteria, UA: NONE SEEN
EPITHELIAL CELLS (NON RENAL): NONE SEEN /HPF (ref 0–10)
RBC MICROSCOPIC, UA: NONE SEEN /HPF (ref 0–?)
RENAL EPITHEL UA: NONE SEEN /HPF
WBC UA: NONE SEEN /HPF (ref 0–?)

## 2016-12-30 LAB — URINALYSIS, COMPLETE
BILIRUBIN UA: NEGATIVE
Glucose, UA: NEGATIVE
Ketones, UA: NEGATIVE
LEUKOCYTES UA: NEGATIVE
Nitrite, UA: NEGATIVE
PH UA: 6.5 (ref 5.0–7.5)
Protein, UA: NEGATIVE
RBC UA: NEGATIVE
Specific Gravity, UA: 1.02 (ref 1.005–1.030)
Urobilinogen, Ur: 0.2 mg/dL (ref 0.2–1.0)

## 2017-03-05 ENCOUNTER — Other Ambulatory Visit: Payer: BC Managed Care – PPO

## 2017-03-05 DIAGNOSIS — N411 Chronic prostatitis: Secondary | ICD-10-CM

## 2017-03-05 DIAGNOSIS — Z0000 Encounter for general adult medical examination without abnormal findings: Secondary | ICD-10-CM

## 2017-03-05 DIAGNOSIS — E7800 Pure hypercholesterolemia, unspecified: Secondary | ICD-10-CM

## 2017-03-05 DIAGNOSIS — E559 Vitamin D deficiency, unspecified: Secondary | ICD-10-CM

## 2017-03-05 DIAGNOSIS — I480 Paroxysmal atrial fibrillation: Secondary | ICD-10-CM

## 2017-03-05 DIAGNOSIS — N400 Enlarged prostate without lower urinary tract symptoms: Secondary | ICD-10-CM

## 2017-03-05 DIAGNOSIS — I341 Nonrheumatic mitral (valve) prolapse: Secondary | ICD-10-CM

## 2017-03-06 ENCOUNTER — Other Ambulatory Visit: Payer: Self-pay | Admitting: *Deleted

## 2017-03-06 LAB — BMP8+EGFR
BUN/Creatinine Ratio: 13 (ref 9–20)
BUN: 13 mg/dL (ref 6–24)
CO2: 24 mmol/L (ref 20–29)
Calcium: 9.5 mg/dL (ref 8.7–10.2)
Chloride: 102 mmol/L (ref 96–106)
Creatinine, Ser: 0.97 mg/dL (ref 0.76–1.27)
GFR calc Af Amer: 106 mL/min/{1.73_m2}
GFR calc non Af Amer: 92 mL/min/{1.73_m2}
Glucose: 91 mg/dL (ref 65–99)
Potassium: 4.5 mmol/L (ref 3.5–5.2)
Sodium: 138 mmol/L (ref 134–144)

## 2017-03-06 LAB — CBC WITH DIFFERENTIAL/PLATELET
Basophils Absolute: 0 10*3/uL (ref 0.0–0.2)
Basos: 1 %
EOS (ABSOLUTE): 0.1 10*3/uL (ref 0.0–0.4)
Eos: 2 %
Hematocrit: 43.1 % (ref 37.5–51.0)
Hemoglobin: 14.8 g/dL (ref 13.0–17.7)
Immature Grans (Abs): 0 10*3/uL (ref 0.0–0.1)
Immature Granulocytes: 0 %
Lymphocytes Absolute: 1.5 10*3/uL (ref 0.7–3.1)
Lymphs: 35 %
MCH: 29.7 pg (ref 26.6–33.0)
MCHC: 34.3 g/dL (ref 31.5–35.7)
MCV: 86 fL (ref 79–97)
Monocytes Absolute: 0.3 10*3/uL (ref 0.1–0.9)
Monocytes: 8 %
Neutrophils Absolute: 2.4 10*3/uL (ref 1.4–7.0)
Neutrophils: 54 %
Platelets: 300 10*3/uL (ref 150–379)
RBC: 4.99 x10E6/uL (ref 4.14–5.80)
RDW: 13.5 % (ref 12.3–15.4)
WBC: 4.4 10*3/uL (ref 3.4–10.8)

## 2017-03-06 LAB — LIPID PANEL
Chol/HDL Ratio: 3.6 ratio (ref 0.0–5.0)
Cholesterol, Total: 210 mg/dL — ABNORMAL HIGH (ref 100–199)
HDL: 58 mg/dL
LDL Calculated: 136 mg/dL — ABNORMAL HIGH (ref 0–99)
Triglycerides: 82 mg/dL (ref 0–149)
VLDL Cholesterol Cal: 16 mg/dL (ref 5–40)

## 2017-03-06 LAB — PSA, TOTAL AND FREE
PSA, Free Pct: 24.7 %
PSA, Free: 0.42 ng/mL
Prostate Specific Ag, Serum: 1.7 ng/mL (ref 0.0–4.0)

## 2017-03-06 LAB — HEPATIC FUNCTION PANEL
ALT: 21 [IU]/L (ref 0–44)
AST: 25 [IU]/L (ref 0–40)
Albumin: 4.5 g/dL (ref 3.5–5.5)
Alkaline Phosphatase: 69 [IU]/L (ref 39–117)
Bilirubin Total: 0.6 mg/dL (ref 0.0–1.2)
Bilirubin, Direct: 0.14 mg/dL (ref 0.00–0.40)
Total Protein: 7 g/dL (ref 6.0–8.5)

## 2017-03-06 LAB — VITAMIN D 25 HYDROXY (VIT D DEFICIENCY, FRACTURES): VIT D 25 HYDROXY: 23.4 ng/mL — AB (ref 30.0–100.0)

## 2017-03-06 MED ORDER — VITAMIN D (ERGOCALCIFEROL) 1.25 MG (50000 UNIT) PO CAPS: 50000.0000 [IU] | ORAL_CAPSULE | ORAL | 3 refills | Status: DC

## 2017-03-16 ENCOUNTER — Other Ambulatory Visit (HOSPITAL_COMMUNITY): Payer: Self-pay | Admitting: Nurse Practitioner

## 2017-03-31 ENCOUNTER — Other Ambulatory Visit: Payer: Self-pay | Admitting: Family Medicine

## 2017-03-31 NOTE — Telephone Encounter (Signed)
Last seen 12/24/16  DWM

## 2017-03-31 NOTE — Telephone Encounter (Signed)
Patient aware that doxycycline has been sent to pharmacy

## 2017-03-31 NOTE — Telephone Encounter (Signed)
Please forward to Dr. Christell Constant as he knows the patient better. He should be back tomorrow

## 2017-04-09 ENCOUNTER — Ambulatory Visit (INDEPENDENT_AMBULATORY_CARE_PROVIDER_SITE_OTHER): Payer: BC Managed Care – PPO | Admitting: Licensed Clinical Social Worker

## 2017-04-09 DIAGNOSIS — F419 Anxiety disorder, unspecified: Secondary | ICD-10-CM

## 2017-04-28 ENCOUNTER — Other Ambulatory Visit: Payer: Self-pay | Admitting: Family Medicine

## 2017-04-29 ENCOUNTER — Ambulatory Visit: Payer: BC Managed Care – PPO | Admitting: Family Medicine

## 2017-05-07 ENCOUNTER — Ambulatory Visit (INDEPENDENT_AMBULATORY_CARE_PROVIDER_SITE_OTHER): Payer: BC Managed Care – PPO | Admitting: Internal Medicine

## 2017-05-07 VITALS — BP 124/73 | HR 72 | Ht 76.0 in | Wt 191.0 lb

## 2017-05-07 DIAGNOSIS — R002 Palpitations: Secondary | ICD-10-CM

## 2017-05-07 DIAGNOSIS — I480 Paroxysmal atrial fibrillation: Secondary | ICD-10-CM

## 2017-05-07 DIAGNOSIS — F419 Anxiety disorder, unspecified: Secondary | ICD-10-CM

## 2017-05-07 MED ORDER — ATENOLOL 25 MG PO TABS: 25.0000 mg | ORAL_TABLET | Freq: Every day | ORAL | 3 refills | Status: DC

## 2017-05-07 MED ORDER — METOPROLOL SUCCINATE ER 25 MG PO TB24: 25.0000 mg | ORAL_TABLET | Freq: Every day | ORAL | 3 refills | Status: DC

## 2017-05-07 MED ORDER — BISOPROLOL FUMARATE 5 MG PO TABS: ORAL_TABLET | ORAL | 3 refills | Status: DC

## 2017-05-07 NOTE — Patient Instructions (Signed)
Your physician recommends that you continue on your current medications as directed. Please refer to the Current Medication list given to you today.   Your physician wants you to follow-up in: YEAR WITH DR KLEIN  You will receive a reminder letter in the mail two months in advance. If you don't receive a letter, please call our office to schedule the follow-up appointment.  

## 2017-05-07 NOTE — Progress Notes (Signed)
Patient Care Team: Chipper Herb, MD as PCP - General (Family Medicine) Larey Dresser, MD (Cardiology) Danella Sensing, MD (Dermatology) Sandrea Matte., MD (Ophthalmology)   HPI  Charles Rasmussen is a 46 y.o. male Seen in follow-up for palpitations. These include PVCs.  Event recording was utilized to define mechanisms of palpitations thought to be different from PVCs.   In the interim he was found to have atrial fibrillation prompting cardioversion. He is being using metoprolol and when necessary Cardizem for palpitations  Thinks he is not as sharp on beta blockers even his very low dose  He reminds me that his mom died of stroke x2 12-16-2022    Anxiety remains a major issue per previous notes    Past Medical History:  Diagnosis Date  . Atrial fibrillation ([place])   . Hemorrhoid thrombosis   . Mitral valve prolapse   . Unspecified hemorrhoids without mention of complication 7253  . Venous reflux 01/02/12   deep right per Nelda Severe. Kellie Simmering, M.D.    Past Surgical History:  Procedure Laterality Date  . MOUTH SURGERY     wisdom teeth  . neg hx      Current Outpatient Prescriptions  Medication Sig Dispense Refill  . AFLURIA QUADRIVALENT 0.5 ML injection TO BE ADMINISTERED BY PHARMACIST FOR IMMUNIZATION  0  . Artificial Tear Solution (TEARS RENEWED OP) Place 1-2 drops into both eyes 3 (three) times daily as needed (for dry eyes).    Marland Kitchen diltiazem (CARDIZEM) 30 MG tablet Take 1 tablet every 4 hours AS NEEDED for AFIB heart rate >100 as long as blood pressure >100. 45 tablet 3  . doxycycline (VIBRA-TABS) 100 MG tablet TAKE 1 TABLET (100 MG TOTAL) BY MOUTH 2 (TWO) TIMES DAILY. 60 tablet 0  . Na Sulfate-K Sulfate-Mg Sulf (SUPREP BOWEL PREP KIT) 17.5-3.13-1.6 GM/180ML SOLN Take 1 kit by mouth as directed. 324 mL 0  . Vitamin D, Ergocalciferol, (DRISDOL) 50000 units CAPS capsule Take 1 capsule (50,000 Units total) by mouth every 7 (seven) days. 12 capsule 3  . metoprolol tartrate  (LOPRESSOR) 25 MG tablet Take 0.5 tablets (12.5 mg total) by mouth 2 (two) times daily. 30 tablet 6   No current facility-administered medications for this visit.     No Known Allergies    Review of Systems negative except from HPI and PMH  Physical Exam BP 124/73   Pulse 72   Ht '6\' 4"'  (1.93 m)   Wt 191 lb (86.6 kg)   BMI 23.25 kg/m  Well developed and nourished in no acute distress HENT normal Neck supple with JVP-flat Carotids brisk and full without bruits Clear Regular rate and rhythm, no murmurs or gallops Abd-soft with active BS without hepatomegaly No Clubbing cyanosis edema Skin-warm and dry A & Oriented  Grossly normal sensory and motor function     Assessment and  Plan  Palpitations-PVCs  Side effects ? BB  Atrial Fib paroxysmal  Anxiety  We discussed strategies for afib, incl waiting at home and hoping for spontaneous termination; alternatively he can go to the ER for DCCV or a trial of po flecainide   For this I told him I wojld write a Rx to give to ER  We have also given him different Rx for BB--atenolol 25, bisoprolol 5, meto succ 25 to see if any is better tolerated  Encouraged management of anxiety and to consider biofeedback  More than 50% of 40 min was spent in counseling related  to the above

## 2017-05-13 ENCOUNTER — Encounter (HOSPITAL_COMMUNITY): Payer: Self-pay | Admitting: Nurse Practitioner

## 2017-05-13 ENCOUNTER — Ambulatory Visit (HOSPITAL_COMMUNITY)
Admission: RE | Admit: 2017-05-13 | Discharge: 2017-05-13 | Disposition: A | Discharge status: Home / Self Care | Payer: BC Managed Care – PPO | Source: Ambulatory Visit | Attending: Nurse Practitioner | Admitting: Nurse Practitioner

## 2017-05-13 VITALS — BP 130/78 | HR 75 | Ht 76.0 in

## 2017-05-13 DIAGNOSIS — Z79899 Other long term (current) drug therapy: Secondary | ICD-10-CM | POA: Diagnosis not present

## 2017-05-13 DIAGNOSIS — I480 Paroxysmal atrial fibrillation: Secondary | ICD-10-CM | POA: Diagnosis present

## 2017-05-13 DIAGNOSIS — Z800 Family history of malignant neoplasm of digestive organs: Secondary | ICD-10-CM | POA: Insufficient documentation

## 2017-05-13 DIAGNOSIS — Z8249 Family history of ischemic heart disease and other diseases of the circulatory system: Secondary | ICD-10-CM | POA: Insufficient documentation

## 2017-05-13 NOTE — Progress Notes (Signed)
Primary Care Physician: Chipper Herb, MD Referring Physician: [facility] ER f/u Cardiologist: Orma Render Charles Rasmussen is a 46 y.o. male with a h/o paroxsymal afib that has recently been documented after having short bursts of palpitations for some time. It was  dx with a ER visit 07/20/16, showing afib, and pt required cardioversion x 2 in the ER. He was placed on metoprolol 12.5 mg bid. He was trying to wean off drug and had not had any BB the day of his next afib episode 2/21 and presented with afib with rvr at 170 bpm  In the late afternoon. No known trigger.Again required cardioversion. He was placed on 25 mg bid of toprol and again will require DAOC x 30 days s/p cardioversion with a CHA2DS2VASc score of 0.  In the afib office today, 10/31. He has not had an episode of afib for over 6 months. He asked to be seen for some f/u questions from visit with Dr. Caryl Comes. He was c/o of mental dullness with metoprolol tartrate  and he was given 3 different BB's that he could try if he wished to see if he could tolerate one better than the other.  Today, he denies symptoms of palpitations, chest pain, shortness of breath, orthopnea, PND, lower extremity edema, dizziness, presyncope, syncope, or neurologic sequela. The patient is tolerating medications without difficulties and is otherwise without complaint today.   Past Medical History:  Diagnosis Date  . Atrial fibrillation ([place])   . Hemorrhoid thrombosis   . Mitral valve prolapse   . Unspecified hemorrhoids without mention of complication 8889  . Venous reflux 01/02/12   deep right per Nelda Severe. Kellie Simmering, M.D.   Past Surgical History:  Procedure Laterality Date  . MOUTH SURGERY     wisdom teeth  . neg hx      Current Outpatient Prescriptions  Medication Sig Dispense Refill  . AFLURIA QUADRIVALENT 0.5 ML injection TO BE ADMINISTERED BY PHARMACIST FOR IMMUNIZATION  0  . Artificial Tear Solution (TEARS RENEWED OP) Place 1-2 drops into both eyes 3 (three)  times daily as needed (for dry eyes).    Marland Kitchen diltiazem (CARDIZEM) 30 MG tablet Take 1 tablet every 4 hours AS NEEDED for AFIB heart rate >100 as long as blood pressure >100. 45 tablet 3  . metoprolol tartrate (LOPRESSOR) 25 MG tablet Take 0.5 tablets (12.5 mg total) by mouth 2 (two) times daily. 30 tablet 6  . Vitamin D, Ergocalciferol, (DRISDOL) 50000 units CAPS capsule Take 1 capsule (50,000 Units total) by mouth every 7 (seven) days. 12 capsule 3  . atenolol (TENORMIN) 25 MG tablet Take by mouth daily.    . bisoprolol (ZEBETA) 5 MG tablet 2.5 MG EVERY DAY  1/2 TAB (Patient not taking: Reported on 05/13/2017) 15 tablet 3  . doxycycline (VIBRA-TABS) 100 MG tablet TAKE 1 TABLET (100 MG TOTAL) BY MOUTH 2 (TWO) TIMES DAILY. (Patient not taking: Reported on 05/13/2017) 60 tablet 0  . metoprolol succinate (TOPROL-XL) 25 MG 24 hr tablet Take 25 mg by mouth daily.    . Na Sulfate-K Sulfate-Mg Sulf (SUPREP BOWEL PREP KIT) 17.5-3.13-1.6 GM/180ML SOLN Take 1 kit by mouth as directed. (Patient not taking: Reported on 05/13/2017) 324 mL 0   No current facility-administered medications for this encounter.     No Known Allergies  Social History   Social History  . Marital status: Single    Spouse name: N/A  . Number of children: 0  . Years of education:  N/A   Occupational History  .  [place] [employer] History Main Topics  . Smoking status: Never Smoker  . Smokeless tobacco: Never Used  . Alcohol use No  . Drug use: No  . Sexual activity: Not on file   Other Topics Concern  . Not on file   Social History Narrative  . No narrative on file    Family History  Problem Relation Age of Onset  . Colon cancer Father 18  . Rectal cancer Father   . Hypertension Father   . Colon cancer Maternal Grandfather 68  . Rectal cancer Paternal Grandfather   . Heart attack Paternal Uncle   . Stroke Neg Hx     ROS- All systems are reviewed and negative except as per the HPI  above  Physical Exam: Vitals:   05/13/17 1553  BP: 130/78  Pulse: 75  SpO2: 98%  Height: '6\' 4"'  (1.93 m)   Wt Readings from Last 3 Encounters:  05/07/17 191 lb (86.6 kg)  12/24/16 194 lb (88 kg)  10/21/16 198 lb (89.8 kg)    Labs: Lab Results  Component Value Date   NA 138 03/05/2017   K 4.5 03/05/2017   CL 102 03/05/2017   CO2 24 03/05/2017   GLUCOSE 91 03/05/2017   BUN 13 03/05/2017   CREATININE 0.97 03/05/2017   CALCIUM 9.5 03/05/2017   MG 2.0 09/03/2016   No results found for: INR Lab Results  Component Value Date   CHOL 210 (H) 03/05/2017   HDL 58 03/05/2017   LDLCALC 136 (H) 03/05/2017   TRIG 82 03/05/2017     GEN- The patient is well appearing, alert and oriented x 3 today.   Head- normocephalic, atraumatic Eyes-  Sclera clear, conjunctiva pink Ears- hearing intact Oropharynx- clear Neck- supple, no JVP Lymph- no cervical lymphadenopathy Lungs- Clear to ausculation bilaterally, normal work of breathing Heart- Regular rate and rhythm, no murmurs, rubs or gallops, PMI not laterally displaced GI- soft, NT, ND, + BS Extremities- no clubbing, cyanosis, or edema MS- no significant deformity or atrophy Skin- no rash or lesion Psych- euthymic mood, full affect Neuro- strength and sensation are intact  EKG-NSR  '@95'  bpm,Pr int 160 ms, qrs int 88 ms, qtc 432 ms Echo-Study Conclusions  - Left ventricle: The cavity size was normal. There was mild   concentric hypertrophy. Systolic function was normal. The   estimated ejection fraction was in the range of 55% to 60%. Wall   motion was normal; there were no regional wall motion   abnormalities. Left ventricular diastolic function parameters   were normal. - Aortic valve: Transvalvular velocity was within the normal range.   There was no stenosis. There was no regurgitation. - Mitral valve: Thickening and elongation of the anterior leaflet.   Transvalvular velocity was within the normal range. There was no    evidence for stenosis. There was mild regurgitation. - Right ventricle: The cavity size was normal. Wall thickness was   normal. Systolic function was normal. - Tricuspid valve: There was trivial regurgitation. - Pulmonary arteries: Systolic pressure was within the normal   range.    Assessment and Plan: 1. Paroxsymal afib General education re afib and how to manage Has an anxious personality and is very afraid of having another attack 30 mg cardizem pill in pocket as needed Discussed the various BB's that were prescribed and it OK to try each one for about 2-3 weeks each and see if there were  any he tolerated better. Continue metoprolol tartrated 12.5 mg bid for now Does not need DOAC long term due to CHA2DS2VASc score of 0  F/u with Dr. Caryl Comes in one year, afib clinic as needed  [place]. Joleene Burnham, [facility] [address] [place],  [address] [phone]

## 2017-06-22 ENCOUNTER — Telehealth: Payer: Self-pay | Admitting: Physician Assistant

## 2017-06-22 ENCOUNTER — Telehealth: Payer: Self-pay | Admitting: Internal Medicine

## 2017-06-22 NOTE — Telephone Encounter (Signed)
Paged by answering service. Patient went to afib about 1 hour ago while push truck in snow. No other symptoms. He took PRN cardizem 30mg  about 30 minutes ago. He still feels afib without chest pain, shortness of breath, dizziness or syncope. Pulse 60 manual check. Current he does not have blood pressure machine.   Advise to come ER for cardioversion, patient declined due to cost. He will continue to monitor his symptoms. Will come to Er if worsen otherwise he will Wednesday for appointment in Afib clinic. Advised to any another cardizem PRN if needed.

## 2017-06-22 NOTE — Telephone Encounter (Signed)
PT  CALLED IN TO INFORM HE WAS DOING WELL AND WILL ARRIVE  IN WED  AFIB  CLINIC AND WILL CALL AND  CONFIRM APPOINTMENT IN AM.

## 2017-06-30 ENCOUNTER — Ambulatory Visit: Payer: BC Managed Care – PPO | Admitting: Family Medicine

## 2017-06-30 ENCOUNTER — Encounter: Payer: Self-pay | Admitting: Family Medicine

## 2017-06-30 VITALS — BP 129/72 | HR 81 | Temp 97.4°F | Ht 76.0 in | Wt 198.0 lb

## 2017-06-30 DIAGNOSIS — E7800 Pure hypercholesterolemia, unspecified: Secondary | ICD-10-CM | POA: Diagnosis not present

## 2017-06-30 DIAGNOSIS — E559 Vitamin D deficiency, unspecified: Secondary | ICD-10-CM | POA: Diagnosis not present

## 2017-06-30 DIAGNOSIS — I480 Paroxysmal atrial fibrillation: Secondary | ICD-10-CM

## 2017-06-30 DIAGNOSIS — Z800 Family history of malignant neoplasm of digestive organs: Secondary | ICD-10-CM | POA: Diagnosis not present

## 2017-06-30 DIAGNOSIS — N400 Enlarged prostate without lower urinary tract symptoms: Secondary | ICD-10-CM

## 2017-06-30 DIAGNOSIS — I341 Nonrheumatic mitral (valve) prolapse: Secondary | ICD-10-CM

## 2017-06-30 DIAGNOSIS — F418 Other specified anxiety disorders: Secondary | ICD-10-CM | POA: Diagnosis not present

## 2017-06-30 DIAGNOSIS — R4589 Other symptoms and signs involving emotional state: Secondary | ICD-10-CM

## 2017-06-30 MED ORDER — BUSPIRONE HCL 15 MG PO TABS: ORAL_TABLET | ORAL | 1 refills | Status: DC

## 2017-06-30 MED ORDER — DOXYCYCLINE HYCLATE 100 MG PO TABS: ORAL_TABLET | ORAL | 1 refills | Status: DC

## 2017-06-30 NOTE — Progress Notes (Signed)
Subjective:    Patient ID: Charles Rasmussen, male    DOB: [birth year], 46 y.o.   MRN: [id number]  HPI Pt here for follow up and management of chronic medical problems which includes a fib and hyperlipidemia. He is taking medication regularly.  The patient has atrial fibrillation and did have a recurrence of this last week and this was controlled at home.  He does take atenolol daily as well as metoprolol 25 mg daily.  He takes Zebeta half a tablet daily vitamin D and Cardizem.  He is due to have a colonoscopy soon.  The last colonoscopy was in September 2006.  The patient's weight is up 7 pounds since he was last checked.  He will get a urine today and will return for lab work.  He got his flu shot at [facility].  He is requesting a refill on his doxycycline for his chronic prostatitis.  He was last seen by Dr. Caryl Comes the cardiologist.  He has PVCs but also has paroxysmal atrial fibrillation.  He has several treatments to try for these palpitations instead of metoprolol including atenolol 25 and bisoprololl.  He is actually taking the cardia zyme as a preventative when he has the irregularities and this was taken recently and aborted the atrial fib.  He remains anxious because of what happened to his mom.  He also worries about prostate and prostate infections.  He plans to call and arrange his colonoscopy as it is due now.  He denies any chest pain or shortness of breath and currently is not having any GI problems including nausea vomiting diarrhea or blood in the stool.  He does have some constipation and does take MiraLAX for this.  He is currently passing his water okay but does have a lot of vague symptoms when he has problems with his prostate which include sort of low back pain and some thigh pain and discomfort.  He cannot say for certain that doing a prostate exam makes him feel any better.  He just wants the reassurance that there is no infection going on in the prostate with the prostate wet  prep.    Patient Active Problem List   Diagnosis Date Noted  . Palpitations 01/10/2015  . 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 PVC (premature ventricular contraction) 02/19/2014  . Pectus excavatum 11/07/2013  . Thrombosed external hemorrhoid 12/21/2012  . Chronic prostatitis 10/28/2012  . Hyperlipidemia 10/28/2012  . MVP (mitral valve prolapse) 10/28/2012  . Varicose veins of lower extremities with other complications 35/45/6256   Outpatient Encounter Medications as of 06/30/2017  Medication Sig  . AFLURIA QUADRIVALENT 0.5 ML injection TO BE ADMINISTERED BY PHARMACIST FOR IMMUNIZATION  . Artificial Tear Solution (TEARS RENEWED OP) Place 1-2 drops into both eyes 3 (three) times daily as needed (for dry eyes).  Marland Kitchen diltiazem (CARDIZEM) 30 MG tablet Take 1 tablet every 4 hours AS NEEDED for AFIB heart rate >100 as long as blood pressure >100.  . [DISCONTINUED] metoprolol succinate (TOPROL-XL) 25 MG 24 hr tablet Take 25 mg by mouth daily.  . [DISCONTINUED] metoprolol tartrate (LOPRESSOR) 25 MG tablet Take 0.5 tablets (12.5 mg total) by mouth 2 (two) times daily.  . [DISCONTINUED] Vitamin D, Ergocalciferol, (DRISDOL) 50000 units CAPS capsule Take 1 capsule (50,000 Units total) by mouth every 7 (seven) days.  Marland Kitchen atenolol (TENORMIN) 25 MG tablet Take by mouth daily.  . bisoprolol (ZEBETA) 5 MG tablet 2.5 MG EVERY DAY  1/2 TAB (Patient not taking: Reported on 06/30/2017)  .  doxycycline (VIBRA-TABS) 100 MG tablet TAKE 1 TABLET (100 MG TOTAL) BY MOUTH 2 (TWO) TIMES DAILY. (Patient not taking: Reported on 06/30/2017)  . Na Sulfate-K Sulfate-Mg Sulf (SUPREP BOWEL PREP KIT) 17.5-3.13-1.6 GM/180ML SOLN Take 1 kit by mouth as directed. (Patient not taking: Reported on 05/13/2017)   No facility-administered encounter medications on file as of 06/30/2017.       Review of Systems  Constitutional: Negative.   HENT: Negative.   Eyes: Negative.   Respiratory: Negative.   Cardiovascular: Negative.    Gastrointestinal: Negative.   Endocrine: Negative.   Genitourinary: Negative.   Musculoskeletal: Negative.   Skin: Negative.   Allergic/Immunologic: Negative.   Neurological: Negative.   Hematological: Negative.   Psychiatric/Behavioral: Negative.        Objective:   Physical Exam  Constitutional: He is oriented to person, place, and time. He appears well-developed and well-nourished.  Pleasant somewhat anxious young man worried about his heart irregularities and his ongoing prostate symptoms.  HENT:  Head: Normocephalic and atraumatic.  Right Ear: External ear normal.  Left Ear: External ear normal.  Nose: Nose normal.  Mouth/Throat: Oropharynx is clear and moist. No oropharyngeal exudate.  Nasal congestion bilaterally  Eyes: Conjunctivae and EOM are normal. Pupils are equal, round, and reactive to light. Right eye exhibits no discharge. Left eye exhibits no discharge. No scleral icterus.  Neck: Normal range of motion. Neck supple. No thyromegaly present.  No bruits thyromegaly or anterior cervical adenopathy  Cardiovascular: Normal rate, regular rhythm, normal heart sounds and intact distal pulses.  No murmur heard. Heart is regular at 72/min  Pulmonary/Chest: Effort normal and breath sounds normal. No respiratory distress. He has no wheezes. He has no rales. He exhibits no tenderness.  Pectus deformity.  Lungs clear anteriorly and posteriorly and no axillary adenopathy  Abdominal: Soft. Bowel sounds are normal. He exhibits no mass. There is no tenderness. There is no rebound and no guarding.  Slight suprapubic and right upper quadrant tenderness no liver or spleen enlargement no bruits no inguinal adenopathy  Genitourinary: Rectum normal and penis normal.  Genitourinary Comments: The prostate was congested the left slightly greater than the right but no lumps or masses.  Slightly tender to palpation.  The prostate was massaged.  There were no inguinal hernias palpable and  external genitalia were within normal limits.  Musculoskeletal: Normal range of motion. He exhibits no edema.  Lymphadenopathy:    He has no cervical adenopathy.  Neurological: He is alert and oriented to person, place, and time. He has normal reflexes. No cranial nerve deficit.  Skin: Skin is warm and dry. No rash noted.  Psychiatric: He has a normal mood and affect. His behavior is normal. Judgment and thought content normal.  Nursing note and vitals reviewed.   BP 129/72 (BP Location: Left Arm)   Pulse 81   Temp (!) 97.4 F (36.3 C) (Oral)   Ht 6' 4" (1.93 m)   Wt 198 lb (89.8 kg)   BMI 24.10 kg/m   The original urinalysis today was clear and only had amorphous material. The wet prep had 10-20 WBC after the prostate with a lot of bacteria. A second urine will be checked and sent out for culture and sensitivity following the prostate exam     Assessment & Plan:  1. MVP (mitral valve prolapse) -T need to follow-up with cardiology  2. Paroxysmal atrial fibrillation (HCC) -Continue to follow-up with cardiology and find the correct maintenance medication that works best for patient   as directed by cardiologist.  He will continue to use the Cardizem to break any bouts of atrial fib going to the emergency room.  3. Pure hypercholesterolemia -Continue with aggressive therapeutic lifestyle changes including diet and exercise  4. Vitamin D deficiency  5. Family history of colon cancer -His father and grandfather had rectal cancer.  The patient plans to talk to the gastroenterologist soon and arrange for a colonoscopy.  6. Benign prostatic hyperplasia without lower urinary tract symptoms -He has ongoing chronic prostatitis symptoms with low back pain and suprapubic pain and thigh pain at times.  He says he can tell when he is having problems and usually does not have a burning with passing his water with his prostate symptoms. -Take doxycycline twice daily with food and return to the  office in 4 weeks for recheck of urinalysis and prostate.  7.  Anxiety about health -Try BuSpar 15 mg twice daily if needed  Meds ordered this encounter  Medications  . busPIRone (BUSPAR) 15 MG tablet    Sig: Take 0.5-1 tab BID PRN    Dispense:  60 tablet    Refill:  1  . doxycycline (VIBRA-TABS) 100 MG tablet    Sig: TAKE 1 TABLET (100 MG TOTAL) BY MOUTH 2 (TWO) TIMES DAILY.    Dispense:  60 tablet    Refill:  1   Patient Instructions  Continue current medications. Continue good therapeutic lifestyle changes which include good diet and exercise. Fall precautions discussed with patient. If an FOBT was given today- please return it to our front desk. If you are over 50 years old - you may need Prevnar 13 or the adult Pneumonia vaccine.  **Flu shots are available--- please call and schedule a FLU-CLINIC appointment**  After your visit with us today you will receive a survey in the mail or online from [employer] regarding your care with us. Please take a moment to fill this out. Your feedback is very important to us as you can help us better understand your patient needs as well as improve your experience and satisfaction. WE CARE ABOUT YOU!!!   Take doxycycline until culture and sensitivity is returned Twice a day We will call in a prescription for BuSpar 15 mg 1/2-1 twice daily and try this if needed for anxiety.  This is not habit forming and is well tolerated and may allay some anxiety is associated with your palpitations Continue to avoid caffeine Continue to follow-up with cardiology as planned Schedule your visit with the gastroenterologist to get your follow-up colonoscopy We will given an appointment for 4 weeks and if you are doing a lot better you can reschedule that for 6 months from now.  Don W. [name] MD   

## 2017-06-30 NOTE — Patient Instructions (Addendum)
Continue current medications. Continue good therapeutic lifestyle changes which include good diet and exercise. Fall precautions discussed with patient. If an FOBT was given today- please return it to our front desk. If you are over 48 years old - you may need Prevnar 13 or the adult Pneumonia vaccine.  **Flu shots are available--- please call and schedule a FLU-CLINIC appointment**  After your visit with us today you will receive a survey in the mail or online from [employer] regarding your care with us. Please take a moment to fill this out. Your feedback is very important to us as you can help us better understand your patient needs as well as improve your experience and satisfaction. WE CARE ABOUT YOU!!!   Take doxycycline until culture and sensitivity is returned Twice a day We will call in a prescription for BuSpar 15 mg 1/2-1 twice daily and try this if needed for anxiety.  This is not habit forming and is well tolerated and may allay some anxiety is associated with your palpitations Continue to avoid caffeine Continue to follow-up with cardiology as planned Schedule your visit with the gastroenterologist to get your follow-up colonoscopy We will given an appointment for 4 weeks and if you are doing a lot better you can reschedule that for 6 months from now.

## 2017-07-01 ENCOUNTER — Other Ambulatory Visit (HOSPITAL_COMMUNITY): Payer: Self-pay | Admitting: Nurse Practitioner

## 2017-07-01 NOTE — Addendum Note (Signed)
Addended by: Magdalene RiverBULLINS, JAMIE H on: 07/01/2017 08:28 AM   Modules accepted: Orders

## 2017-07-02 LAB — URINE CULTURE: ORGANISM ID, BACTERIA: NO GROWTH

## 2017-07-08 LAB — URINALYSIS, COMPLETE
Bilirubin, UA: NEGATIVE
GLUCOSE, UA: NEGATIVE
Ketones, UA: NEGATIVE
Leukocytes, UA: NEGATIVE
Nitrite, UA: NEGATIVE
PROTEIN UA: NEGATIVE
Specific Gravity, UA: 1.02 (ref 1.005–1.030)
UUROB: 0.2 mg/dL (ref 0.2–1.0)
pH, UA: 7 (ref 5.0–7.5)

## 2017-07-08 LAB — MICROSCOPIC EXAMINATION
BACTERIA UA: NONE SEEN
Epithelial Cells (non renal): NONE SEEN /hpf (ref 0–10)
RBC, UA: NONE SEEN /hpf (ref 0–?)
Renal Epithel, UA: NONE SEEN /hpf
WBC UA: NONE SEEN /HPF (ref 0–?)

## 2017-07-09 ENCOUNTER — Telehealth: Payer: Self-pay | Admitting: Family Medicine

## 2017-07-09 NOTE — Telephone Encounter (Signed)
Attempted to contact patient.  Letter sent with lab results

## 2017-07-13 ENCOUNTER — Other Ambulatory Visit (HOSPITAL_COMMUNITY): Payer: Self-pay | Admitting: Nurse Practitioner

## 2017-07-15 ENCOUNTER — Other Ambulatory Visit (HOSPITAL_COMMUNITY): Payer: Self-pay | Admitting: Nurse Practitioner

## 2017-07-29 ENCOUNTER — Encounter: Payer: Self-pay | Admitting: Family Medicine

## 2017-07-29 ENCOUNTER — Ambulatory Visit: Payer: BC Managed Care – PPO | Admitting: Family Medicine

## 2017-07-29 VITALS — BP 103/56 | HR 80 | Temp 97.3°F | Ht 76.0 in | Wt 198.0 lb

## 2017-07-29 DIAGNOSIS — F418 Other specified anxiety disorders: Secondary | ICD-10-CM

## 2017-07-29 DIAGNOSIS — R002 Palpitations: Secondary | ICD-10-CM

## 2017-07-29 DIAGNOSIS — I480 Paroxysmal atrial fibrillation: Secondary | ICD-10-CM | POA: Diagnosis not present

## 2017-07-29 DIAGNOSIS — N411 Chronic prostatitis: Secondary | ICD-10-CM | POA: Diagnosis not present

## 2017-07-29 DIAGNOSIS — J301 Allergic rhinitis due to pollen: Secondary | ICD-10-CM | POA: Diagnosis not present

## 2017-07-29 LAB — URINALYSIS, COMPLETE
BILIRUBIN UA: NEGATIVE
Glucose, UA: NEGATIVE
Ketones, UA: NEGATIVE
Leukocytes, UA: NEGATIVE
NITRITE UA: NEGATIVE
Protein, UA: NEGATIVE
RBC UA: NEGATIVE
SPEC GRAV UA: 1.02 (ref 1.005–1.030)
Urobilinogen, Ur: 0.2 mg/dL (ref 0.2–1.0)
pH, UA: 7 (ref 5.0–7.5)

## 2017-07-29 LAB — MICROSCOPIC EXAMINATION
BACTERIA UA: NONE SEEN
CAST TYPE: NONE SEEN
Casts: NONE SEEN /lpf
EPITHELIAL CELLS (NON RENAL): NONE SEEN /HPF (ref 0–10)
RBC, UA: NONE SEEN /hpf (ref 0–?)
RENAL EPITHEL UA: NONE SEEN /HPF
WBC, UA: NONE SEEN /hpf (ref 0–?)

## 2017-07-29 MED ORDER — FLUTICASONE PROPIONATE 50 MCG/ACT NA SUSP: 2.0000 | Freq: Every day | NASAL | 6 refills | Status: DC

## 2017-07-29 NOTE — Patient Instructions (Signed)
Continue with Lopressor 25 one half twice a day on a regular basis.  Try taking one half if needed or prior to an event which could cause excitement. Or otherwise try half of the diazepam 5 mg prior to the expected event Or otherwise half or whole BuSpar which is not habit forming. Continue to avoid caffeine Periodic use of Debrox eardrops could help soften earwax so that it would come out more readily.  He would use 3-4 drops in each ear canal for 3 nights in a row and repeat this again in 1 week.

## 2017-07-29 NOTE — Progress Notes (Signed)
Subjective:    Patient ID: Charles Rasmussen, male    DOB: 1970/09/14, 47 y.o.   MRN: [id number]  HPI  Patient here today for 4 week follow up on prostate issues.  At the last visit, the patient did have a urinalysis after the prostate exam and WBCs were seen in the urine.  As a result of that he was started on antibiotics.  In the meantime the culture came back with no growth.  The patient did take a full course of doxycycline.  He also complains today of a fluttering sensation in the left ear.  Patient is doing much better with his prostate and voiding habits.  He has 2 more weeks worth of doxycycline.  We reviewed the at previous office visit noting that the pus cells were present after the prostate exam and that the culture had no growth.  He will continue with the antibiotic until it is completed and we will wait and see how things go with how he feels at that time.  We will not do a rectal exam today and he is agreeable to that.  He does complain of a fluttering sensation in his left ear only when he is laying on his right side.  We did discuss the use of reducing palpitations and symptoms.  He has several options.  He could take a half of the diazepam which he has at home prior to the event that might cause him to be more excited or he could take a half of the BuSpar R half of the Lopressor 25.  He is going to try these things and see if 1 of them helps calm him down so that he does not get so anxious.  He always has had some nasal congestion and turbinate swelling and will also go ahead and try some Flonase 1 spray each nostril at bedtime.    Patient Active Problem List   Diagnosis Date Noted  . Palpitations 01/10/2015  . 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 PVC (premature ventricular contraction) 02/19/2014  . Pectus excavatum 11/07/2013  . Thrombosed external hemorrhoid 12/21/2012  . Chronic prostatitis 10/28/2012  . Hyperlipidemia 10/28/2012  . MVP (mitral valve prolapse) 10/28/2012  .  Varicose veins of lower extremities with other complications 62/26/3335   Outpatient Encounter Medications as of 07/29/2017  Medication Sig  . AFLURIA QUADRIVALENT 0.5 ML injection TO BE ADMINISTERED BY PHARMACIST FOR IMMUNIZATION  . Artificial Tear Solution (TEARS RENEWED OP) Place 1-2 drops into both eyes 3 (three) times daily as needed (for dry eyes).  Marland Kitchen atenolol (TENORMIN) 25 MG tablet Take by mouth daily.  . bisoprolol (ZEBETA) 5 MG tablet 2.5 MG EVERY DAY  1/2 TAB  . busPIRone (BUSPAR) 15 MG tablet Take 0.5-1 tab BID PRN  . diltiazem (CARDIZEM) 30 MG tablet Take 1 tablet every 4 hours AS NEEDED for AFIB heart rate >100 as long as blood pressure >100.  Marland Kitchen doxycycline (VIBRA-TABS) 100 MG tablet TAKE 1 TABLET (100 MG TOTAL) BY MOUTH 2 (TWO) TIMES DAILY.  . metoprolol tartrate (LOPRESSOR) 25 MG tablet TAKE 1/2 TABLET BY MOUTH TWICE A DAY  . Na Sulfate-K Sulfate-Mg Sulf (SUPREP BOWEL PREP KIT) 17.5-3.13-1.6 GM/180ML SOLN Take 1 kit by mouth as directed.   No facility-administered encounter medications on file as of 07/29/2017.       Review of Systems  Constitutional: Negative.   HENT: Negative.        Left ear "flutters" at times   Eyes: Negative.  Respiratory: Negative.   Cardiovascular: Negative.   Gastrointestinal: Negative.   Endocrine: Negative.   Genitourinary: Negative.   Musculoskeletal: Negative.   Skin: Negative.   Allergic/Immunologic: Negative.   Neurological: Negative.   Hematological: Negative.   Psychiatric/Behavioral: Negative.        Objective:   Physical Exam  Constitutional: He is oriented to person, place, and time. He appears well-developed and well-nourished. No distress.  HENT:  Head: Normocephalic and atraumatic.  Right Ear: External ear normal.  Left Ear: External ear normal.  Nose: Nose normal.  Mouth/Throat: Oropharynx is clear and moist. No oropharyngeal exudate.  Ears cerumen bilaterally greater than right some was removed with curette but the  remainder has to be irrigated.  Nasal congestion and turbinate swelling and redness bilaterally  Eyes: Conjunctivae and EOM are normal. Pupils are equal, round, and reactive to light. Right eye exhibits no discharge. Left eye exhibits no discharge. No scleral icterus.  Neck: Normal range of motion. Neck supple. No thyromegaly present.  No adenopathy   Cardiovascular: Normal rate, regular rhythm and normal heart sounds.  No murmur heard. Heart is regular at 72/min  Pulmonary/Chest: Effort normal and breath sounds normal. No respiratory distress. He has no wheezes. He has no rales. He exhibits no tenderness.  Clear anteriorly and posteriorly  Musculoskeletal: Normal range of motion. He exhibits no edema.  Lymphadenopathy:    He has no cervical adenopathy.  Neurological: He is alert and oriented to person, place, and time.  Skin: Skin is warm and dry. No rash noted.  Psychiatric: He has a normal mood and affect. His behavior is normal. Judgment and thought content normal.  Nursing note and vitals reviewed.  BP (!) 103/56 (BP Location: Left Arm)   Pulse 80   Temp (!) 97.3 F (36.3 C) (Oral)   Ht '6\' 4"'  (1.93 m)   Wt 198 lb (89.8 kg)   BMI 24.10 kg/m         Assessment & Plan:  1. Chronic prostatitis -Continue and complete the antibiotic - Urine Culture - Urinalysis, Complete  2. Paroxysmal atrial fibrillation (HCC) -Follow-up with cardiology as planned  3. Palpitations -For preventative purposes continue with the half of the metoprolol twice a day and take an extra half if needed or if an event may potentially trigger the palpitations and take it prior to that event  4. Anxiety about health -Take half of a Valium as directed or one half to whole BuSpar as needed  5. Non-seasonal allergic rhinitis due to pollen -Flonase 1 spray each nostril at bedtime  Meds ordered this encounter  Medications  . fluticasone (FLONASE) 50 MCG/ACT nasal spray    Sig: Place 2 sprays into both  nostrils daily.    Dispense:  16 g    Refill:  6   Patient Instructions  Continue with Lopressor 25 one half twice a day on a regular basis.  Try taking one half if needed or prior to an event which could cause excitement. Or otherwise try half of the diazepam 5 mg prior to the expected event Or otherwise half or whole BuSpar which is not habit forming. Continue to avoid caffeine Periodic use of Debrox eardrops could help soften earwax so that it would come out more readily.  He would use 3-4 drops in each ear canal for 3 nights in a row and repeat this again in 1 week.  Arrie Senate MD

## 2017-07-30 LAB — URINE CULTURE: ORGANISM ID, BACTERIA: NO GROWTH

## 2017-08-05 ENCOUNTER — Ambulatory Visit (HOSPITAL_COMMUNITY)
Admission: RE | Admit: 2017-08-05 | Discharge: 2017-08-05 | Disposition: A | Discharge status: Home / Self Care | Payer: BC Managed Care – PPO | Source: Ambulatory Visit | Attending: Nurse Practitioner | Admitting: Nurse Practitioner

## 2017-08-05 ENCOUNTER — Encounter (HOSPITAL_COMMUNITY): Payer: Self-pay | Admitting: Nurse Practitioner

## 2017-08-05 VITALS — BP 134/82 | HR 88 | Ht 76.0 in | Wt 196.0 lb

## 2017-08-05 DIAGNOSIS — I341 Nonrheumatic mitral (valve) prolapse: Secondary | ICD-10-CM | POA: Diagnosis not present

## 2017-08-05 DIAGNOSIS — Z9889 Other specified postprocedural states: Secondary | ICD-10-CM | POA: Diagnosis not present

## 2017-08-05 DIAGNOSIS — Z8249 Family history of ischemic heart disease and other diseases of the circulatory system: Secondary | ICD-10-CM | POA: Diagnosis not present

## 2017-08-05 DIAGNOSIS — Z800 Family history of malignant neoplasm of digestive organs: Secondary | ICD-10-CM | POA: Insufficient documentation

## 2017-08-05 DIAGNOSIS — I480 Paroxysmal atrial fibrillation: Secondary | ICD-10-CM | POA: Insufficient documentation

## 2017-08-05 DIAGNOSIS — Z79899 Other long term (current) drug therapy: Secondary | ICD-10-CM | POA: Insufficient documentation

## 2017-08-05 NOTE — Progress Notes (Signed)
Primary Care Physician: Chipper Herb, MD Referring Physician: [facility] ER f/u Cardiologist: Orma Render Charles Rasmussen is a 47 y.o. male with a h/o paroxsymal afib that has recently been documented after having short bursts of palpitations for some time. It was  dx with a ER visit 07/20/16, showing afib, and pt required cardioversion x 2 in the ER. He was placed on metoprolol 12.5 mg bid. He was trying to wean off drug and had not had any BB the day of his next afib episode 2/21 and presented with afib with rvr at 170 bpm  In the late afternoon. No known trigger.Again required cardioversion. He was placed on 25 mg bid of toprol and again will require DAOC x 30 days s/p cardioversion with a CHA2DS2VASc score of 0.  In the afib office today, 10/31. He has not had an episode of afib for over 6 months. He asked to be seen for some f/u questions from visit with Dr. Caryl Comes. He was c/o of mental dullness with metoprolol tartrate  and he was given 3 different BB's that he could try if he wished to see if he could tolerate one better than the other.  F/u in the afib clinic, 08/05/2017, he reports that he is doing well. Only one episode afib that he did not panic and go to the ER. " I just tried to stay calm and went to sleep and when I woke up it was gone." He has not tried all of the BB's that Dr. Caryl Comes Rx'ed him but is getting good results with metoprolol for now.   Today, he denies symptoms of palpitations, chest pain, shortness of breath, orthopnea, PND, lower extremity edema, dizziness, presyncope, syncope, or neurologic sequela. The patient is tolerating medications without difficulties and is otherwise without complaint today.   Past Medical History:  Diagnosis Date  . Atrial fibrillation ([place])   . Hemorrhoid thrombosis   . Mitral valve prolapse   . Unspecified hemorrhoids without mention of complication 2440  . Venous reflux 01/02/12   deep right per Nelda Severe. Kellie Simmering, M.D.   Past Surgical History:    Procedure Laterality Date  . MOUTH SURGERY     wisdom teeth  . neg hx      Current Outpatient Medications  Medication Sig Dispense Refill  . diltiazem (CARDIZEM) 30 MG tablet Take 1 tablet every 4 hours AS NEEDED for AFIB heart rate >100 as long as blood pressure >100. 45 tablet 3  . doxycycline (VIBRA-TABS) 100 MG tablet TAKE 1 TABLET (100 MG TOTAL) BY MOUTH 2 (TWO) TIMES DAILY. 60 tablet 1  . fluticasone (FLONASE) 50 MCG/ACT nasal spray Place 2 sprays into both nostrils daily. 16 g 6  . metoprolol tartrate (LOPRESSOR) 25 MG tablet TAKE 1/2 TABLET BY MOUTH TWICE A DAY 30 tablet 6  . Na Sulfate-K Sulfate-Mg Sulf (SUPREP BOWEL PREP KIT) 17.5-3.13-1.6 GM/180ML SOLN Take 1 kit by mouth as directed. 324 mL 0  . AFLURIA QUADRIVALENT 0.5 ML injection TO BE ADMINISTERED BY PHARMACIST FOR IMMUNIZATION  0  . Artificial Tear Solution (TEARS RENEWED OP) Place 1-2 drops into both eyes 3 (three) times daily as needed (for dry eyes).    Marland Kitchen atenolol (TENORMIN) 25 MG tablet Take by mouth daily.    . bisoprolol (ZEBETA) 5 MG tablet 2.5 MG EVERY DAY  1/2 TAB (Patient not taking: Reported on 08/05/2017) 15 tablet 3  . busPIRone (BUSPAR) 15 MG tablet Take 0.5-1 tab BID PRN (Patient not taking:  Reported on 08/05/2017) 60 tablet 1   No current facility-administered medications for this encounter.     No Known Allergies  Social History   Socioeconomic History  . Marital status: Single    Spouse name: Not on file  . Number of children: 0  . Years of education: Not on file  . Highest education level: Not on file  Social Needs  . Financial resource strain: Not on file  . Food insecurity - worry: Not on file  . Food insecurity - inability: Not on file  . Transportation needs - medical: Not on file  . Transportation needs - non-medical: Not on file  Occupational History    Employer: [employer]  Tobacco Use  . Smoking status: Never Smoker  . Smokeless tobacco: Never Used  Substance and  Sexual Activity  . Alcohol use: No  . Drug use: No  . Sexual activity: Not on file  Other Topics Concern  . Not on file  Social History Narrative  . Not on file    Family History  Problem Relation Age of Onset  . Colon cancer Father 83  . Rectal cancer Father   . Hypertension Father   . Colon cancer Maternal Grandfather 72  . Rectal cancer Paternal Grandfather   . Heart attack Paternal Uncle   . Stroke Neg Hx     ROS- All systems are reviewed and negative except as per the HPI above  Physical Exam: Vitals:   08/05/17 1535  BP: 134/82  Pulse: 88  Weight: 196 lb (88.9 kg)  Height: _0  (1.93 m)   Wt Readings from Last 3 Encounters:  08/05/17 196 lb (88.9 kg)  07/29/17 198 lb (89.8 kg)  06/30/17 198 lb (89.8 kg)    Labs: Lab Results  Component Value Date   NA 138 03/05/2017   K 4.5 03/05/2017   CL 102 03/05/2017   CO2 24 03/05/2017   GLUCOSE 91 03/05/2017   BUN 13 03/05/2017   CREATININE 0.97 03/05/2017   CALCIUM 9.5 03/05/2017   MG 2.0 09/03/2016   No results found for: INR Lab Results  Component Value Date   CHOL 210 (H) 03/05/2017   HDL 58 03/05/2017   LDLCALC 136 (H) 03/05/2017   TRIG 82 03/05/2017     GEN- The patient is well appearing, alert and oriented x 3 today.   Head- normocephalic, atraumatic Eyes-  Sclera clear, conjunctiva pink Ears- hearing intact Oropharynx- clear Neck- supple, no JVP Lymph- no cervical lymphadenopathy Lungs- Clear to ausculation bilaterally, normal work of breathing Heart- Regular rate and rhythm, no murmurs, rubs or gallops, PMI not laterally displaced GI- soft, NT, ND, + BS Extremities- no clubbing, cyanosis, or edema MS- no significant deformity or atrophy Skin- no rash or lesion Psych- euthymic mood, full affect Neuro- strength and sensation are intact  EKG-NSR  @ 88 bpm,Pr int 160 ms, qrs int 88 ms, qtc 416 ms Echo-Study Conclusions  - Left ventricle: The cavity size was normal. There was mild    concentric hypertrophy. Systolic function was normal. The   estimated ejection fraction was in the range of 55% to 60%. Wall   motion was normal; there were no regional wall motion   abnormalities. Left ventricular diastolic function parameters   were normal. - Aortic valve: Transvalvular velocity was within the normal range.   There was no stenosis. There was no regurgitation. - Mitral valve: Thickening and elongation of the anterior leaflet.   Transvalvular velocity was within  the normal range. There was no   evidence for stenosis. There was mild regurgitation. - Right ventricle: The cavity size was normal. Wall thickness was   normal. Systolic function was normal. - Tricuspid valve: There was trivial regurgitation. - Pulmonary arteries: Systolic pressure was within the normal   range.    Assessment and Plan: 1. Paroxsymal afib General education re afib and how to manage Has an anxious personality and is very afraid of having another attack, he is learning to relax to treat afib instead of ER visit He is very happy that he was able to get out of afib on his own with last episode, currently with very low afib burden 30 mg cardizem pill in pocket as needed Discussed the various BB's that were prescribed and it OK to try each one for about 2-3 weeks each and see if there were any he tolerated better. Continue metoprolol tartrated 12.5 mg bid for now Does not need DOAC long term due to CHA2DS2VASc score of 0  F/u with Dr. Caryl Comes in one year, afib clinic as needed  [place]. Daron Stutz, [facility] [address] [place], [place] [address] [phone]

## 2017-08-24 ENCOUNTER — Ambulatory Visit: Payer: Self-pay | Admitting: Licensed Clinical Social Worker

## 2017-08-31 ENCOUNTER — Telehealth: Payer: Self-pay | Admitting: Family Medicine

## 2017-08-31 NOTE — Telephone Encounter (Signed)
Pt has LBP from lifting over the weekend Pt instructed to use anti-inflammatories and muscle rub Will call back to schedule if sxs persist or worsen

## 2017-10-01 ENCOUNTER — Ambulatory Visit (INDEPENDENT_AMBULATORY_CARE_PROVIDER_SITE_OTHER): Payer: BC Managed Care – PPO | Admitting: Licensed Clinical Social Worker

## 2017-10-01 DIAGNOSIS — F419 Anxiety disorder, unspecified: Secondary | ICD-10-CM | POA: Diagnosis not present

## 2017-10-31 ENCOUNTER — Telehealth: Payer: Self-pay | Admitting: Cardiology

## 2017-10-31 NOTE — Telephone Encounter (Signed)
Patient called stating that he has back pain and wanted to know if it was ok to take Ibuprofen with h/o PAF.  I reassured him this was fine to do.  He is not on any long term anticoagulation.

## 2017-11-04 ENCOUNTER — Encounter: Payer: Self-pay | Admitting: *Deleted

## 2017-12-28 ENCOUNTER — Ambulatory Visit: Payer: BC Managed Care – PPO | Admitting: Family Medicine

## 2017-12-30 ENCOUNTER — Encounter: Payer: Self-pay | Admitting: Family Medicine

## 2017-12-30 ENCOUNTER — Ambulatory Visit (INDEPENDENT_AMBULATORY_CARE_PROVIDER_SITE_OTHER): Payer: BC Managed Care – PPO | Admitting: Family Medicine

## 2017-12-30 VITALS — BP 113/79 | HR 73 | Temp 98.4°F | Ht 76.0 in | Wt 194.0 lb

## 2017-12-30 DIAGNOSIS — I341 Nonrheumatic mitral (valve) prolapse: Secondary | ICD-10-CM

## 2017-12-30 DIAGNOSIS — I480 Paroxysmal atrial fibrillation: Secondary | ICD-10-CM

## 2017-12-30 DIAGNOSIS — N400 Enlarged prostate without lower urinary tract symptoms: Secondary | ICD-10-CM

## 2017-12-30 DIAGNOSIS — E559 Vitamin D deficiency, unspecified: Secondary | ICD-10-CM

## 2017-12-30 DIAGNOSIS — E7800 Pure hypercholesterolemia, unspecified: Secondary | ICD-10-CM

## 2017-12-30 DIAGNOSIS — Z0000 Encounter for general adult medical examination without abnormal findings: Secondary | ICD-10-CM | POA: Diagnosis not present

## 2017-12-30 LAB — URINALYSIS, COMPLETE
BILIRUBIN UA: NEGATIVE
GLUCOSE, UA: NEGATIVE
Ketones, UA: NEGATIVE
Leukocytes, UA: NEGATIVE
Nitrite, UA: NEGATIVE
RBC UA: NEGATIVE
Specific Gravity, UA: 1.02 (ref 1.005–1.030)
Urobilinogen, Ur: 0.2 mg/dL (ref 0.2–1.0)
pH, UA: 7 (ref 5.0–7.5)

## 2017-12-30 LAB — MICROSCOPIC EXAMINATION
Bacteria, UA: NONE SEEN
EPITHELIAL CELLS (NON RENAL): NONE SEEN /HPF (ref 0–10)
Renal Epithel, UA: NONE SEEN /hpf
WBC UA: NONE SEEN /HPF (ref 0–5)

## 2017-12-30 MED ORDER — METOPROLOL TARTRATE 25 MG PO TABS: 12.5000 mg | ORAL_TABLET | Freq: Two times a day (BID) | ORAL | 3 refills | Status: DC

## 2017-12-30 NOTE — Progress Notes (Signed)
Subjective:    Patient ID: Charles Rasmussen, male    DOB: Mar 16, 1971, 46 y.o.   MRN: [id number]  HPI Pt here for CPE and management of chronic medical problems which includes a fib and hyperlipidemia. He is taking medication regularly.  This patient is here today for complete physical.  He has no specific complaints.  Has a history of atrial fibrillation and hyperlipidemia.  Over the past year he lost his mother suddenly because of a CVA.  He is requesting a refill on his metoprolol.  His vital signs are stable and his BMI is excellent at 23.87.  He will be given an FOBT to return and he will return to the office fasting for his blood work.  The patient is pleasant and relaxed and has only had one episode of his fast heartbeat or atrial fibrillation was back in December.  At that time he took the Cardizem which is only 30 mg in about 4 hours later the episode resolved on its own.  He is taking metoprolol 25 less than one half a tablet twice daily.  He does take something like MiraLAX for constipation.  He tries to drink plenty of water and fluids.  He denies any chest pain pressure or tightness and only had the fluttering sensation with the atrial fib in December.  He has not had any other episodes since.  He has no shortness of breath.  He denies any trouble with his stomach other than some mild constipation issues.  He denies any heartburn indigestion nausea vomiting diarrhea or blood in the stool.  He is passing his water well and without problems.  He is 47 years old and will make sure that he gets another colonoscopy by the time he turns 10.     Patient Active Problem List   Diagnosis Date Noted  . Palpitations 01/10/2015  . 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 PVC (premature ventricular contraction) 02/19/2014  . Pectus excavatum 11/07/2013  . Thrombosed external hemorrhoid 12/21/2012  . Chronic prostatitis 10/28/2012  . Hyperlipidemia 10/28/2012  . MVP (mitral valve prolapse) 10/28/2012  .  Varicose veins of lower extremities with other complications 40/98/1191   Outpatient Encounter Medications as of 12/30/2017  Medication Sig  . AFLURIA QUADRIVALENT 0.5 ML injection TO BE ADMINISTERED BY PHARMACIST FOR IMMUNIZATION  . Artificial Tear Solution (TEARS RENEWED OP) Place 1-2 drops into both eyes 3 (three) times daily as needed (for dry eyes).  Marland Kitchen atenolol (TENORMIN) 25 MG tablet Take by mouth daily.  . bisoprolol (ZEBETA) 5 MG tablet 2.5 MG EVERY DAY  1/2 TAB (Patient not taking: Reported on 08/05/2017)  . busPIRone (BUSPAR) 15 MG tablet Take 0.5-1 tab BID PRN (Patient not taking: Reported on 08/05/2017)  . diltiazem (CARDIZEM) 30 MG tablet Take 1 tablet every 4 hours AS NEEDED for AFIB heart rate >100 as long as blood pressure >100.  Marland Kitchen doxycycline (VIBRA-TABS) 100 MG tablet TAKE 1 TABLET (100 MG TOTAL) BY MOUTH 2 (TWO) TIMES DAILY.  . fluticasone (FLONASE) 50 MCG/ACT nasal spray Place 2 sprays into both nostrils daily.  . metoprolol tartrate (LOPRESSOR) 25 MG tablet TAKE 1/2 TABLET BY MOUTH TWICE A DAY  . Na Sulfate-K Sulfate-Mg Sulf (SUPREP BOWEL PREP KIT) 17.5-3.13-1.6 GM/180ML SOLN Take 1 kit by mouth as directed.   No facility-administered encounter medications on file as of 12/30/2017.      Review of Systems  Constitutional: Negative.   HENT: Negative.   Eyes: Negative.   Respiratory: Negative.  Cardiovascular: Negative.   Gastrointestinal: Negative.   Endocrine: Negative.   Genitourinary: Negative.   Musculoskeletal: Negative.   Skin: Negative.   Allergic/Immunologic: Negative.   Neurological: Negative.   Hematological: Negative.   Psychiatric/Behavioral: Negative.        Objective:   Physical Exam  Constitutional: He is oriented to person, place, and time. He appears well-developed and well-nourished. No distress.  The patient is pleasant and relaxed and seems calmer than he has seemed in the past and I think that his psychotherapy has helped him considerably to  be stronger emotionally.  HENT:  Head: Normocephalic and atraumatic.  Right Ear: External ear normal.  Left Ear: External ear normal.  Mouth/Throat: Oropharynx is clear and moist. No oropharyngeal exudate.  Nasal turbinate congestion bilaterally  Eyes: Pupils are equal, round, and reactive to light. Conjunctivae and EOM are normal. Right eye exhibits no discharge. Left eye exhibits no discharge. No scleral icterus.  Last eye exam was about 1 year ago and he will get this every 2 years  Neck: Normal range of motion. Neck supple. No thyromegaly present.  No bruits thyromegaly or anterior cervical adenopathy  Cardiovascular: Normal rate, normal heart sounds and intact distal pulses.  No murmur heard. The heart was slightly irregular with a skip every now and then at about 60 to 72/min.  Pulmonary/Chest: Effort normal and breath sounds normal. He has no wheezes. He has no rales.  Patient has pectus excavatum deformity but no problems with this.  No axillary adenopathy and lungs were clear anteriorly and posteriorly  Abdominal: Soft. Bowel sounds are normal. He exhibits no distension and no mass. There is tenderness. There is no rebound. No hernia.  No liver or spleen enlargement no bruits and no inguinal adenopathy.  No masses.  Minimal tenderness in the suprapubic area.  Genitourinary: Rectum normal and penis normal.  Genitourinary Comments: The prostate was minimally enlarged.  No inguinal hernias were palpated.  The external genitalia was normal.  There were no rectal masses.  Musculoskeletal: Normal range of motion. He exhibits no edema or deformity.  Lymphadenopathy:    He has no cervical adenopathy.  Neurological: He is alert and oriented to person, place, and time. He has normal reflexes. No cranial nerve deficit.  Reflexes were normal bilaterally.  Skin: Skin is warm and dry. No rash noted.  Psychiatric: He has a normal mood and affect. His behavior is normal. Judgment and thought  content normal.  Patient had normal mood affect and behavior.  Nursing note and vitals reviewed.  BP 113/79 (BP Location: Left Arm)   Pulse 73   Temp 98.4 F (36.9 C) (Oral)   Ht '6\' 4"'  (1.93 m)   Wt 194 lb (88 kg)   BMI 23.61 kg/m         Assessment & Plan:  1. Annual physical exam -Patient is doing well overall. - BMP8+EGFR; Future - CBC with Differential/Platelet; Future - Lipid panel; Future - VITAMIN D 25 Hydroxy (Vit-D Deficiency, Fractures); Future - Hepatic function panel; Future - PSA, total and free; Future - Thyroid Panel With TSH; Future - Urinalysis, Complete  2. Paroxysmal atrial fibrillation [facility]) -Cardiology as planned in about 6 months, Dr. Caryl Comes. - BMP8+EGFR; Future - CBC with Differential/Platelet; Future  3. Vitamin D deficiency -Continue current treatment pending results of lab work - CBC with Differential/Platelet; Future - VITAMIN D 25 Hydroxy (Vit-D Deficiency, Fractures); Future  4. Pure hypercholesterolemia -Continue aggressive therapeutic lifestyle changes with diet and exercise - BMP8+EGFR;  Future - CBC with Differential/Platelet; Future - Lipid panel; Future - Hepatic function panel; Future  5. Benign prostatic hyperplasia without lower urinary tract symptoms -We will continue to monitor this and we have asked him to let us know if his symptoms get worse we will place on a course of doxycycline.  Patient does have history of chronic prostatitis and is currently doing fairly well with only some slight discomfort - CBC with Differential/Platelet; Future - PSA, total and free; Future - Urinalysis, Complete  6. MVP (mitral valve prolapse) -Continue to follow-up with Dr. Caryl Comes and further evaluation of this. - CBC with Differential/Platelet; Future   Meds ordered this encounter  Medications  . metoprolol tartrate (LOPRESSOR) 25 MG tablet    Sig: Take 0.5 tablets (12.5 mg total) by mouth 2 (two) times daily.    Dispense:  90 tablet     Refill:  3    PER PATIENT, HE IS STILL TAKING METOPROLOL AND HAS NOT YET SWITCHED TO ATENOLOL. HE WOULD LIKE TO CONTINUE WITH THE METOPROLOL FOR NOW. Arnold YOU   Patient Instructions  Continue current medications. Continue good therapeutic lifestyle changes which include good diet and exercise. Fall precautions discussed with patient. If an FOBT was given today- please return it to our front desk. If you are over 50 years old - you may need Prevnar 31 or the adult Pneumonia vaccine.  **Flu shots are available--- please call and schedule a FLU-CLINIC appointment**  After your visit with [place] today you will receive a survey in the mail or online from [employer] regarding your care with [place]. Please take a moment to fill this out. Your feedback is very important to [place] as you can help [place] better understand your patient needs as well as improve your experience and satisfaction. WE CARE ABOUT YOU!!!   Continue to follow-up with the cardiologist as planned Continue current treatment regimen from him Continue to drink plenty of fluids and stay well-hydrated Follow-up with Melinda Crutch as needed and as planned Continue to avoid caffeine We will call with lab work results as soon as those results become available Next number when you turn 48 we will need to make sure that you get a routine colonoscopy  Arrie Senate MD

## 2017-12-30 NOTE — Patient Instructions (Addendum)
Continue current medications. Continue good therapeutic lifestyle changes which include good diet and exercise. Fall precautions discussed with patient. If an FOBT was given today- please return it to our front desk. If you are over 49 years old - you may need Prevnar 13 or the adult Pneumonia vaccine.  **Flu shots are available--- please call and schedule a FLU-CLINIC appointment**  After your visit with us today you will receive a survey in the mail or online from [employer] regarding your care with us. Please take a moment to fill this out. Your feedback is very important to us as you can help us better understand your patient needs as well as improve your experience and satisfaction. WE CARE ABOUT YOU!!!   Continue to follow-up with the cardiologist as planned Continue current treatment regimen from him Continue to drink plenty of fluids and stay well-hydrated Follow-up with Rodman CompJulie Witt as needed and as planned Continue to avoid caffeine We will call with lab work results as soon as those results become available Next number when you turn 48 we will need to make sure that you get a routine colonoscopy

## 2018-02-12 ENCOUNTER — Other Ambulatory Visit: Payer: Self-pay | Admitting: Family Medicine

## 2018-02-12 MED ORDER — DOXYCYCLINE HYCLATE 100 MG PO TABS: ORAL_TABLET | ORAL | 1 refills | Status: DC

## 2018-02-17 ENCOUNTER — Other Ambulatory Visit: Payer: Self-pay | Admitting: Family Medicine

## 2018-02-17 NOTE — Telephone Encounter (Signed)
VM [facility]

## 2018-02-17 NOTE — Telephone Encounter (Signed)
Pt aware where refill was sent into

## 2018-02-19 ENCOUNTER — Other Ambulatory Visit: Payer: Self-pay | Admitting: *Deleted

## 2018-02-19 MED ORDER — DOXYCYCLINE HYCLATE 100 MG PO TABS: ORAL_TABLET | ORAL | 1 refills | Status: DC

## 2018-04-23 IMAGING — DX DG CHEST 2V
3 series · 3 of 3 positions shown · non-contrast
Comparison: Chest x-ray of November 07, 2013

CLINICAL DATA: Palpitations

EXAM:
CHEST  2 VIEW

[chest pa (1 of 2)]
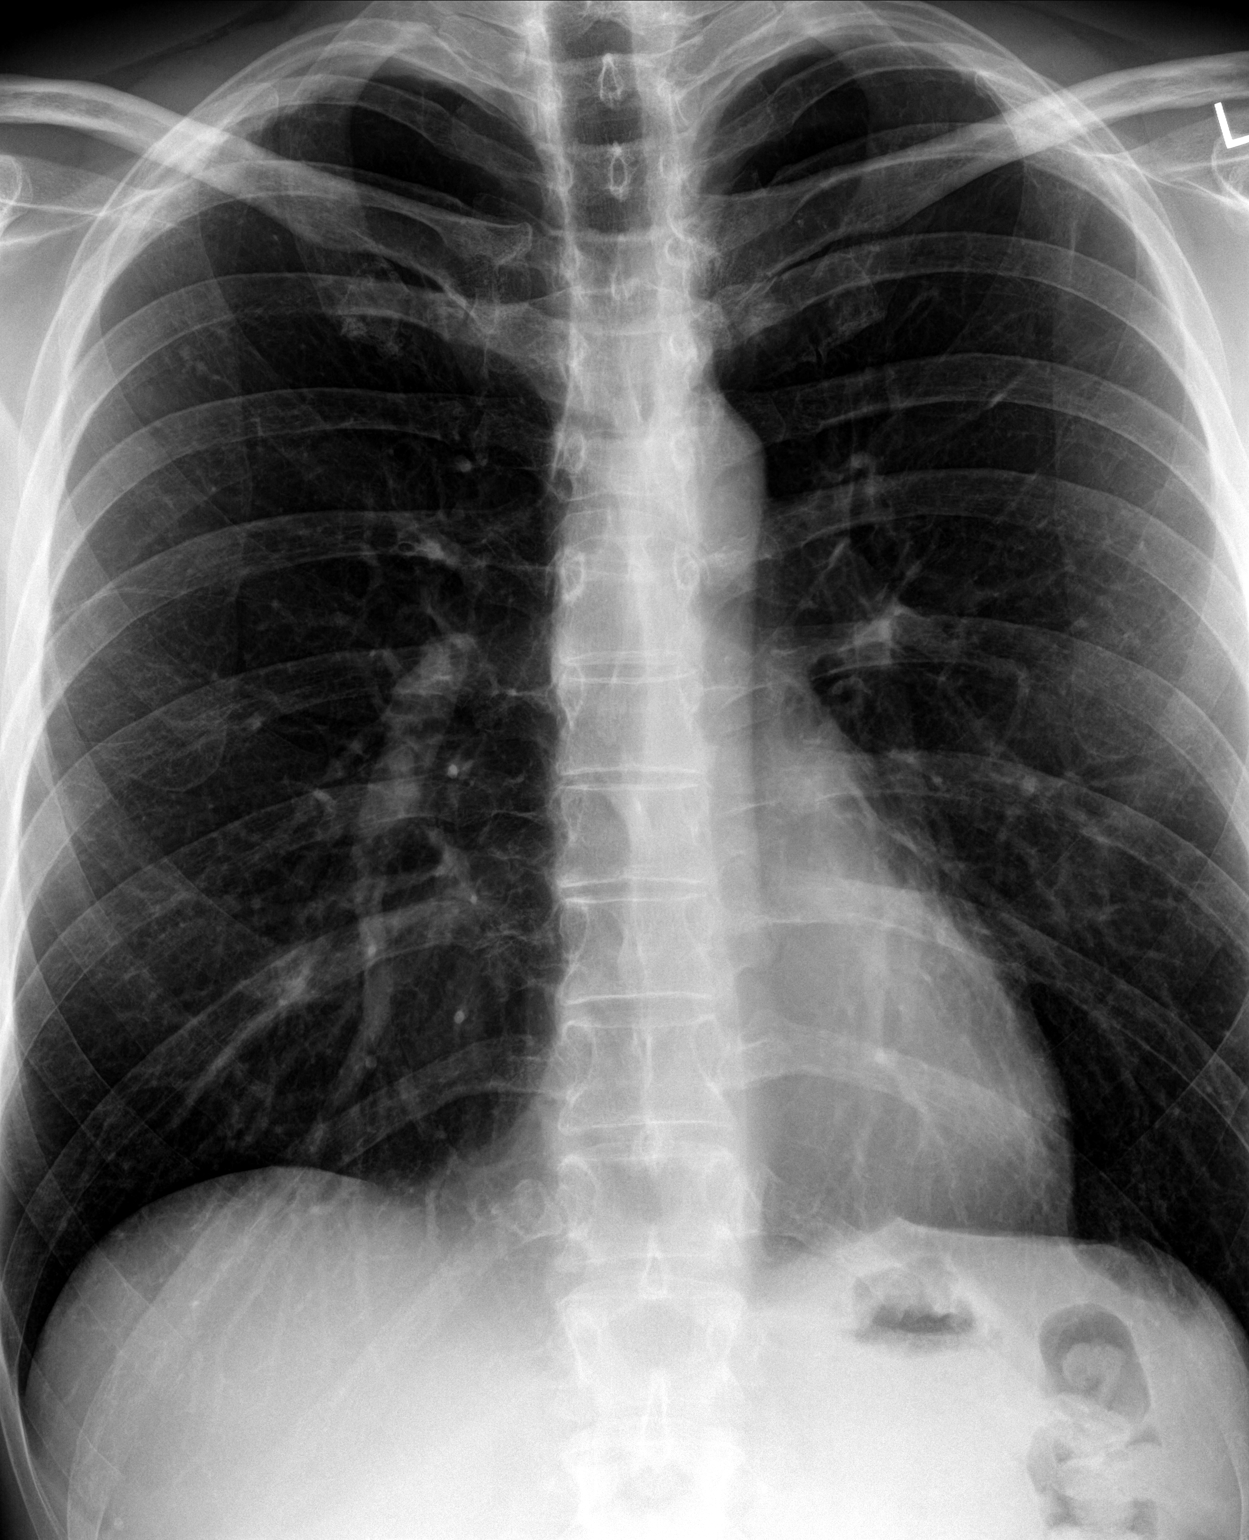

[chest lat]
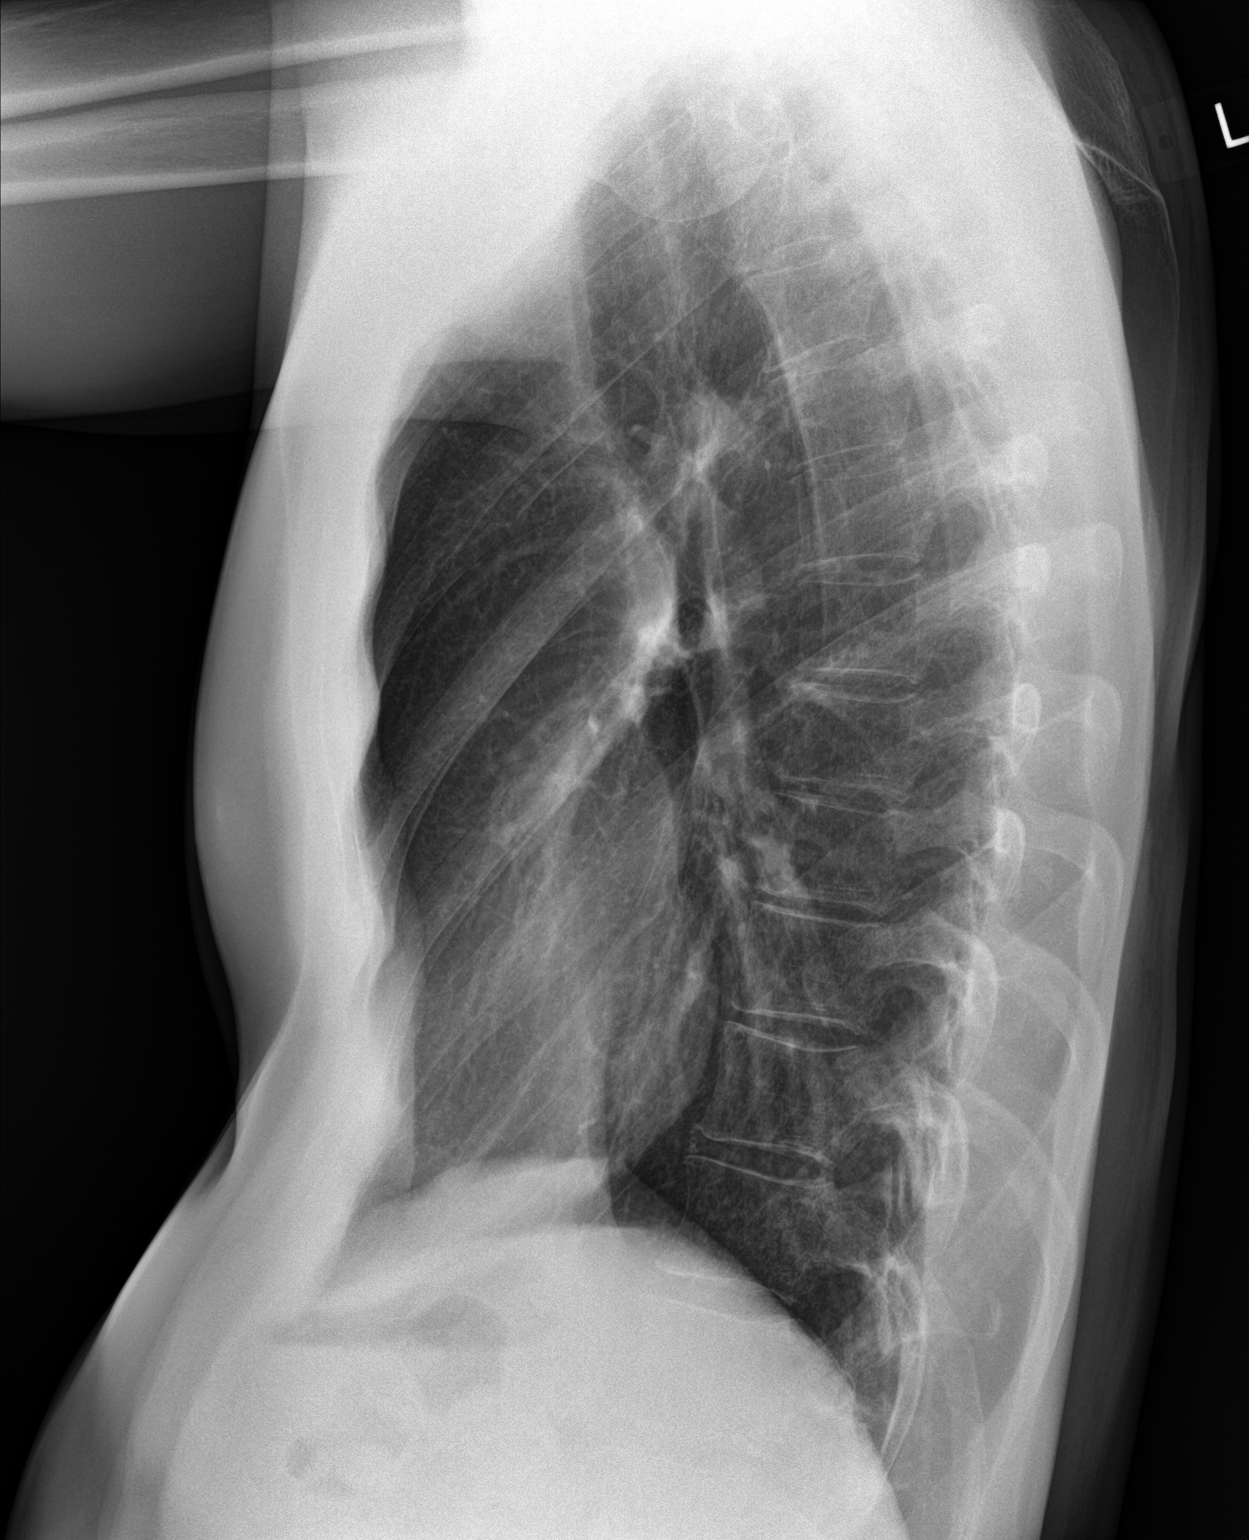

[chest pa (2 of 2)]
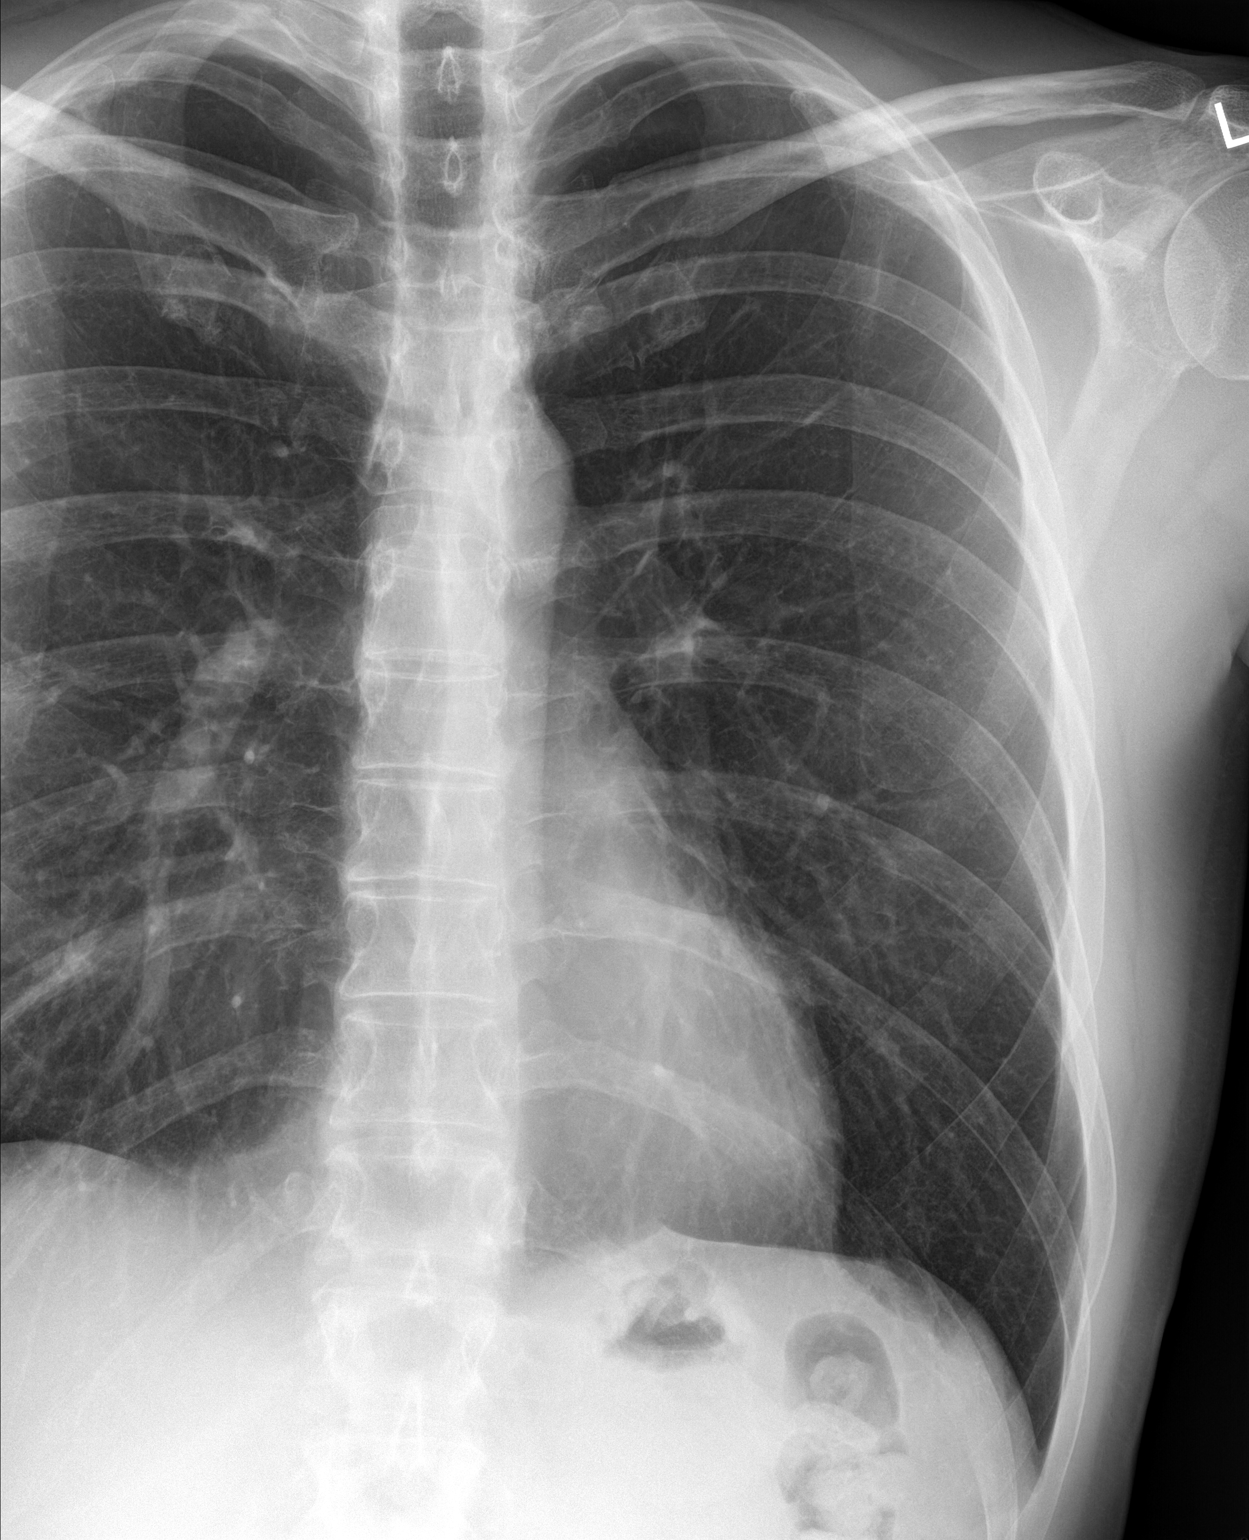

[3 of 3 positions shown; findings below may reference images not displayed]

FINDINGS: The lungs remain mildly hyperinflated but clear. There is no
pneumothorax, pneumomediastinum or pleural effusion. There is no
infiltrate. The heart and pulmonary vascularity are normal. The
mediastinum is normal in width. The bony thorax is unremarkable.
IMPRESSION: There is no active cardiopulmonary disease.

## 2018-04-26 ENCOUNTER — Encounter: Payer: Self-pay | Admitting: Family Medicine

## 2018-04-26 ENCOUNTER — Other Ambulatory Visit: Payer: Self-pay | Admitting: *Deleted

## 2018-04-26 ENCOUNTER — Ambulatory Visit (INDEPENDENT_AMBULATORY_CARE_PROVIDER_SITE_OTHER): Payer: BC Managed Care – PPO | Admitting: Family Medicine

## 2018-04-26 VITALS — BP 113/78 | HR 77 | Temp 97.9°F | Ht 75.75 in | Wt 198.0 lb

## 2018-04-26 DIAGNOSIS — Z024 Encounter for examination for driving license: Secondary | ICD-10-CM

## 2018-04-26 DIAGNOSIS — I341 Nonrheumatic mitral (valve) prolapse: Secondary | ICD-10-CM

## 2018-04-26 DIAGNOSIS — Z1389 Encounter for screening for other disorder: Secondary | ICD-10-CM

## 2018-04-26 DIAGNOSIS — Z23 Encounter for immunization: Secondary | ICD-10-CM

## 2018-04-26 DIAGNOSIS — H938X2 Other specified disorders of left ear: Secondary | ICD-10-CM

## 2018-04-26 LAB — URINALYSIS
Bilirubin, UA: NEGATIVE
GLUCOSE, UA: NEGATIVE
KETONES UA: NEGATIVE
Leukocytes, UA: NEGATIVE
NITRITE UA: NEGATIVE
Protein, UA: NEGATIVE
RBC UA: NEGATIVE
Specific Gravity, UA: <1.005 — ABNORMAL LOW (ref 1.005–1.030)
UUROB: 0.2 mg/dL (ref 0.2–1.0)
pH, UA: 5.5 (ref 5.0–7.5)

## 2018-04-26 NOTE — Progress Notes (Signed)
Subjective:  Patient ID: Charles Rasmussen, male    DOB: [birth year]  Age: 47 y.o. 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 Has A.Fib. folowed byt cardiology. Had cardioversion. Successful. Still taking metoprolol.  Depression screen [facility] 2/9 04/26/2018 12/30/2017 06/30/2017  Decreased Interest 0 0 0  Down, Depressed, Hopeless 0 0 0  PHQ - 2 Score 0 0 0    History Charles Rasmussen has a past medical history of Atrial fibrillation ([place]), Hemorrhoid thrombosis, Mitral valve prolapse, Unspecified hemorrhoids without mention of complication (1423), and Venous reflux (01/02/12).   He has a past surgical history that includes neg hx and Mouth surgery.   His family history includes Colon cancer (age of onset: 47) in his father; Colon cancer (age of onset: 50) in his maternal grandfather; Heart attack in his paternal uncle; Hypertension in his father; Rectal cancer in his father and paternal grandfather; Stroke in his mother.He reports that he has never smoked. He has never used smokeless tobacco. He reports that he does not drink alcohol or use drugs.    ROS Review of Systems  Constitutional: Negative.   HENT: Negative.   Eyes: Negative for visual disturbance.  Respiratory: Negative for cough and shortness of breath.   Cardiovascular: Negative for chest pain and leg swelling.  Gastrointestinal: Negative for abdominal pain, diarrhea, nausea and vomiting.  Genitourinary: Negative for difficulty urinating.  Musculoskeletal: Negative for arthralgias and myalgias.  Skin: Negative for rash.  Neurological: Negative for headaches.  Psychiatric/Behavioral: Negative for sleep disturbance.    Objective:  BP 113/78 (BP Location: Left Arm)   Pulse 77   Temp 97.9 F (36.6 C) (Oral)   Ht 6' 3.75" (1.924 m)   Wt 198 lb (89.8 kg)   BMI 24.26 kg/m   BP Readings from Last 3 Encounters:  04/26/18 113/78  12/30/17 113/79  08/05/17 134/82    Wt Readings from  Last 3 Encounters:  04/26/18 198 lb (89.8 kg)  12/30/17 194 lb (88 kg)  08/05/17 196 lb (88.9 kg)     Physical Exam  Constitutional: He is oriented to person, place, and time. He appears well-developed and well-nourished. No distress.  HENT:  Head: Normocephalic and atraumatic.  Right Ear: External ear normal.  Left Ear: External ear normal.  Nose: Nose normal.  Mouth/Throat: Oropharynx is clear and moist.  Eyes: Pupils are equal, round, and reactive to light. Conjunctivae and EOM are normal.  Neck: Normal range of motion. Neck supple.  Cardiovascular: Normal rate, regular rhythm and normal heart sounds.  No murmur heard. Pulmonary/Chest: Effort normal and breath sounds normal. No respiratory distress. He has no wheezes. He has no rales.  Abdominal: Soft. There is no tenderness.  Musculoskeletal: Normal range of motion.  Neurological: He is alert and oriented to person, place, and time. He has normal reflexes.  Skin: Skin is warm and dry.  Psychiatric: He has a normal mood and affect. His behavior is normal. Judgment and thought content normal.      Assessment & Plan:   Charles Rasmussen was seen today for dot.  Diagnoses and all orders for this visit:  Encounter for Department of Transportation (DOT) examination for driving license renewal  Screening for blood or protein in urine -     Urinalysis  Need for immunization against influenza -     Cancel: Flu Vaccine QUAD 36+ mos IM  MVP (mitral valve prolapse)       I am having  Afnan Eldridge Abrahams maintain his Artificial Tear Solution (TEARS RENEWED OP), diltiazem, Na Sulfate-K Sulfate-Mg Sulf, AFLURIA QUADRIVALENT, bisoprolol, atenolol, busPIRone, fluticasone, metoprolol tartrate, and doxycycline.  Allergies as of 04/26/2018   No Known Allergies     Medication List        Accurate as of 04/26/18 12:01 PM. Always use your most recent med list.          AFLURIA QUADRIVALENT 0.5 ML injection Generic drug:  Influenza vac  split quadrivalent PF TO BE ADMINISTERED BY PHARMACIST FOR IMMUNIZATION   atenolol 25 MG tablet Commonly known as:  TENORMIN Take by mouth daily.   bisoprolol 5 MG tablet Commonly known as:  ZEBETA 2.5 MG EVERY DAY  1/2 TAB   busPIRone 15 MG tablet Commonly known as:  BUSPAR Take 0.5-1 tab BID PRN   diltiazem 30 MG tablet Commonly known as:  CARDIZEM Take 1 tablet every 4 hours AS NEEDED for AFIB heart rate >100 as long as blood pressure >100.   doxycycline 100 MG tablet Commonly known as:  VIBRA-TABS TAKE 1 TABLET (100 MG TOTAL) BY MOUTH 2 (TWO) TIMES DAILY.   fluticasone 50 MCG/ACT nasal spray Commonly known as:  FLONASE Place 2 sprays into both nostrils daily.   metoprolol tartrate 25 MG tablet Commonly known as:  LOPRESSOR Take 0.5 tablets (12.5 mg total) by mouth 2 (two) times daily.   Na Sulfate-K Sulfate-Mg Sulf 17.5-3.13-1.6 GM/177ML Soln Take 1 kit by mouth as directed.   TEARS RENEWED OP Place 1-2 drops into both eyes 3 (three) times daily as needed (for dry eyes).        Follow-up: Return in about 2 years (around 04/26/2020).  Claretta Fraise, M.D.

## 2018-06-30 ENCOUNTER — Encounter: Payer: Self-pay | Admitting: Family Medicine

## 2018-06-30 ENCOUNTER — Ambulatory Visit (INDEPENDENT_AMBULATORY_CARE_PROVIDER_SITE_OTHER): Payer: BC Managed Care – PPO | Admitting: Family Medicine

## 2018-06-30 VITALS — BP 108/64 | HR 66 | Temp 97.4°F | Ht 75.75 in | Wt 200.0 lb

## 2018-06-30 DIAGNOSIS — I341 Nonrheumatic mitral (valve) prolapse: Secondary | ICD-10-CM | POA: Diagnosis not present

## 2018-06-30 DIAGNOSIS — N411 Chronic prostatitis: Secondary | ICD-10-CM

## 2018-06-30 DIAGNOSIS — E7800 Pure hypercholesterolemia, unspecified: Secondary | ICD-10-CM

## 2018-06-30 DIAGNOSIS — Z800 Family history of malignant neoplasm of digestive organs: Secondary | ICD-10-CM

## 2018-06-30 DIAGNOSIS — I480 Paroxysmal atrial fibrillation: Secondary | ICD-10-CM

## 2018-06-30 DIAGNOSIS — E559 Vitamin D deficiency, unspecified: Secondary | ICD-10-CM | POA: Diagnosis not present

## 2018-06-30 DIAGNOSIS — H9202 Otalgia, left ear: Secondary | ICD-10-CM

## 2018-06-30 DIAGNOSIS — Z1389 Encounter for screening for other disorder: Secondary | ICD-10-CM

## 2018-06-30 DIAGNOSIS — M25551 Pain in right hip: Secondary | ICD-10-CM

## 2018-06-30 MED ORDER — DOXYCYCLINE HYCLATE 100 MG PO TABS: ORAL_TABLET | ORAL | 1 refills | Status: DC

## 2018-06-30 MED ORDER — ATENOLOL 25 MG PO TABS: 25.0000 mg | ORAL_TABLET | Freq: Every day | ORAL | 3 refills | Status: DC

## 2018-06-30 NOTE — Progress Notes (Signed)
Subjective:    Patient ID: Charles Rasmussen, male    DOB: 29-Aug-1970, 47 y.o.   MRN: [id number]  HPI Pt here for follow up and management of chronic medical problems which includes hyperlipidemia. He is taking medication regularly.  Patient is doing well overall.  He will be given an FOBT to return.  His lab work is due.  He is currently on doxycycline for his prostate.  He has had right hip pain off and on.  He is also had left ear popping and requests a refill on his atenolol.  Apparently he was supposed to go to an ear nose and throat specialist and did not.  Patient has history of paroxysmal atrial fibrillation.  Patient is pleasant and doing fairly well.  He denies any chest pain.  He is taking metoprolol 12.5 twice daily and takes an atenolol if needed for palpitations or A. fib occurrences.  He has not had a problem with this since November 2018.  He sees Dr. Caryl Comes and is scheduled to see him again soon for a follow-up.  He denies any trouble with nausea vomiting diarrhea blood in the stool black tarry bowel movements or change in bowel habits.  He denies any trouble with passing his water.  He does have recurring bouts of prostatitis and the presentation is usually more of a perineal sensation and sometimes a left side sensation and he can always tell when this happens he takes the antibiotics and he gets better.  He has been on doxycycline for 2 weeks and will make sure that he has enough to take for at least 2-3 more weeks and he will call and get an appointment in 4 weeks or so for follow-up of her urinalysis.  He does complain of some right hip pain off and on with no history of any accidents or injuries it is become more intense recently.  Is being on for 3 to 4 years.  He is also had a popping in his left ear that is been going on for a good period of time and unfortunately forgot his appointment to follow-up on this.  He notices the popping is worse when he is laying on the right side but it occurs  in the left ear.  It is not but is bothersome during the day as it is at nighttime.  His vital signs today are good and his blood pressure is good at 108/64.    Patient Active Problem List   Diagnosis Date Noted  . Palpitations 01/10/2015  . 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 PVC (premature ventricular contraction) 02/19/2014  . Pectus excavatum 11/07/2013  . Thrombosed external hemorrhoid 12/21/2012  . Chronic prostatitis 10/28/2012  . Hyperlipidemia 10/28/2012  . MVP (mitral valve prolapse) 10/28/2012  . Varicose veins of lower extremities with other complications 17/51/0258   Outpatient Encounter Medications as of 06/30/2018  Medication Sig  . AFLURIA QUADRIVALENT 0.5 ML injection TO BE ADMINISTERED BY PHARMACIST FOR IMMUNIZATION  . Artificial Tear Solution (TEARS RENEWED OP) Place 1-2 drops into both eyes 3 (three) times daily as needed (for dry eyes).  Marland Kitchen atenolol (TENORMIN) 25 MG tablet Take by mouth daily.  . bisoprolol (ZEBETA) 5 MG tablet 2.5 MG EVERY DAY  1/2 TAB  . busPIRone (BUSPAR) 15 MG tablet Take 0.5-1 tab BID PRN  . diltiazem (CARDIZEM) 30 MG tablet Take 1 tablet every 4 hours AS NEEDED for AFIB heart rate >100 as long as blood pressure >100.  Marland Kitchen doxycycline (VIBRA-TABS) 100  MG tablet TAKE 1 TABLET (100 MG TOTAL) BY MOUTH 2 (TWO) TIMES DAILY.  . fluticasone (FLONASE) 50 MCG/ACT nasal spray Place 2 sprays into both nostrils daily.  . metoprolol tartrate (LOPRESSOR) 25 MG tablet Take 0.5 tablets (12.5 mg total) by mouth 2 (two) times daily.  . Na Sulfate-K Sulfate-Mg Sulf (SUPREP BOWEL PREP KIT) 17.5-3.13-1.6 GM/180ML SOLN Take 1 kit by mouth as directed. (Patient not taking: Reported on 06/30/2018)   No facility-administered encounter medications on file as of 06/30/2018.       Review of Systems  Constitutional: Negative.   HENT: Positive for ear pain ("popping" left).   Eyes: Negative.   Respiratory: Negative.   Cardiovascular: Negative.   Gastrointestinal:  Negative.   Endocrine: Negative.   Genitourinary: Negative.   Musculoskeletal: Positive for arthralgias (right hip pain).  Skin: Negative.   Allergic/Immunologic: Negative.   Neurological: Negative.   Hematological: Negative.   Psychiatric/Behavioral: Negative.        Objective:   Physical Exam Vitals signs and nursing note reviewed.  Constitutional:      Appearance: Normal appearance. He is well-developed and normal weight.     Comments: The patient is in good spirits and doing well overall especially with heart palpitations.  He continues to have the popping in his left ear and is aware that he is in need of a colonoscopy because of rectal cancer in both his father and grandfather.  HENT:     Head: Normocephalic and atraumatic.     Right Ear: Tympanic membrane, ear canal and external ear normal. There is impacted cerumen.     Left Ear: Tympanic membrane, ear canal and external ear normal. There is impacted cerumen.     Ears:     Comments: Patient has cerumen in both ear canals but eardrums were well visualized.  He will use some Debrox to help remove this.    Nose: Congestion present. No rhinorrhea.     Comments: Minimal nasal turbinate congestion bilaterally    Mouth/Throat:     Mouth: Mucous membranes are moist.     Pharynx: Oropharynx is clear. No oropharyngeal exudate.  Eyes:     General: No scleral icterus.       Right eye: No discharge.        Left eye: No discharge.     Extraocular Movements: Extraocular movements intact.     Conjunctiva/sclera: Conjunctivae normal.     Pupils: Pupils are equal, round, and reactive to light.  Neck:     Musculoskeletal: Normal range of motion and neck supple.     Thyroid: No thyromegaly.     Vascular: No carotid bruit.     Trachea: No tracheal deviation.     Comments: No thyromegaly bruits or anterior cervical adenopathy Cardiovascular:     Rate and Rhythm: Normal rate and regular rhythm.     Pulses: Normal pulses.     Heart  sounds: Normal heart sounds. No murmur.  Pulmonary:     Effort: Pulmonary effort is normal.     Breath sounds: Normal breath sounds. No wheezing or rales.     Comments: Clear anteriorly and posteriorly and no axillary adenopathy or chest wall masses. Chest:     Chest wall: No tenderness.  Abdominal:     General: Abdomen is flat. Bowel sounds are normal.     Palpations: Abdomen is soft. There is no mass.     Tenderness: There is abdominal tenderness.     Comments: Slight suprapubic tenderness and  no inguinal adenopathy masses bruits or organ enlargement  Musculoskeletal: Normal range of motion.        General: Tenderness present. No swelling.     Right lower leg: No edema.     Left lower leg: No edema.     Comments: Tenderness right lateral hip with palpation fairly good movement with minimal pain  Lymphadenopathy:     Cervical: No cervical adenopathy.  Skin:    General: Skin is warm and dry.     Findings: No rash.  Neurological:     Mental Status: He is alert and oriented to person, place, and time. Mental status is at baseline.     Cranial Nerves: No cranial nerve deficit.     Deep Tendon Reflexes: Reflexes are normal and symmetric.  Psychiatric:        Mood and Affect: Mood normal.        Behavior: Behavior normal.        Thought Content: Thought content normal.        Judgment: Judgment normal.     Comments: Good affect and behavior seem more normal and more calm for this patient.    BP 108/64 (BP Location: Left Arm) Comment (BP Location): left ar  Pulse 66   Temp (!) 97.4 F (36.3 C) (Oral)   Ht 6' 3.75" (1.924 m)   Wt 200 lb (90.7 kg)   BMI 24.51 kg/m         Assessment & Plan:  1. Paroxysmal atrial fibrillation ([place]) -Follow-up with cardiology as planned - BMP8+EGFR; Future - CBC with Differential/Platelet; Future  2. Pure hypercholesterolemia -Continue aggressive therapeutic lifestyle changes including diet and exercise - BMP8+EGFR; Future - CBC with  Differential/Platelet; Future - Lipid panel; Future - Hepatic function panel; Future  3. Vitamin D deficiency -Continue with vitamin D replacement pending results of lab work - CBC with Differential/Platelet; Future - VITAMIN D 25 Hydroxy (Vit-D Deficiency, Fractures); Future  4. MVP (mitral valve prolapse) -Follow-up with cardiology as planned - CBC with Differential/Platelet; Future - Hepatic function panel; Future  5. Chronic prostatitis -Take doxycycline for at least 4 weeks and return to the office in about 4 weeks for recheck if not better.  May at least need urinalysis.  6. Screening for blood or protein in urine - Urinalysis, Complete; Future - Urine Culture; Future  7. Right hip pain -X-ray hip: When x-ray is up and running again - DG HIP UNILAT W OR W/O PELVIS 2-3 VIEWS RIGHT; Future  8. Family history of rectal cancer -Arrange for repeat colonoscopy with Dr. Carlean Purl  Meds ordered this encounter  Medications  . atenolol (TENORMIN) 25 MG tablet    Sig: Take 1 tablet (25 mg total) by mouth daily.    Dispense:  90 tablet    Refill:  3   Patient Instructions  Continue current medications. Continue good therapeutic lifestyle changes which include good diet and exercise. Fall precautions discussed with patient. If an FOBT was given today- please return it to our front desk. If you are over 34 years old - you may need Prevnar 49 or the adult Pneumonia vaccine.  **Flu shots are available--- please call and schedule a FLU-CLINIC appointment**  After your visit with [place] today you will receive a survey in the mail or online from [employer] regarding your care with [place]. Please take a moment to fill this out. Your feedback is very important to [place] as you can help [place] better understand your patient needs as  well as improve your experience and satisfaction. WE CARE ABOUT YOU!!!   We will arrange for the patient to see Dr. Vicie Mutters an ear specialist in [place] and we will  arrange for getting an appointment with him.   The patient should follow-up with a cardiologist as planned We will have him come back to the office in about 4 weeks after he is completed the doxycycline and check a urine and make sure everything is better with his prostate.  We will also make sure there are refills on the doxycycline for future use when needed. We will make sure that Dr. Carlean Purl is aware of your need to get a repeat colonoscopy because of the family history of rectal cancer We will arrange for you to come back in 4 weeks if your prostatitis is better we can see you in 6 months Follow-up with cardiologist Dr. Caryl Comes as mentioned above  Arrie Senate MD

## 2018-06-30 NOTE — Addendum Note (Signed)
Addended by: Magdalene RiverBULLINS, Donneisha Beane H on: 06/30/2018 05:15 PM   Modules accepted: Orders

## 2018-06-30 NOTE — Patient Instructions (Addendum)
Continue current medications. Continue good therapeutic lifestyle changes which include good diet and exercise. Fall precautions discussed with patient. If an FOBT was given today- please return it to our front desk. If you are over 49 years old - you may need Prevnar 13 or the adult Pneumonia vaccine.  **Flu shots are available--- please call and schedule a FLU-CLINIC appointment**  After your visit with us today you will receive a survey in the mail or online from [employer] regarding your care with us. Please take a moment to fill this out. Your feedback is very important to us as you can help us better understand your patient needs as well as improve your experience and satisfaction. WE CARE ABOUT YOU!!!   We will arrange for the patient to see Dr. Ermalinda BarriosEric Kraus an ear specialist in [place] and we will arrange for getting an appointment with him.   The patient should follow-up with a cardiologist as planned We will have him come back to the office in about 4 weeks after he is completed the doxycycline and check a urine and make sure everything is better with his prostate.  We will also make sure there are refills on the doxycycline for future use when needed. We will make sure that Dr. Leone PayorGessner is aware of your need to get a repeat colonoscopy because of the family history of rectal cancer We will arrange for you to come back in 4 weeks if your prostatitis is better we can see you in 6 months Follow-up with cardiologist Dr. Graciela HusbandsKlein as mentioned above

## 2018-07-02 IMAGING — CR DG CHEST 2V
2 series · 2 of 2 positions shown · non-contrast
Comparison: 06/25/2016

CLINICAL DATA: Atrial fibrillation.

EXAM:
CHEST  2 VIEW

[chest pa]
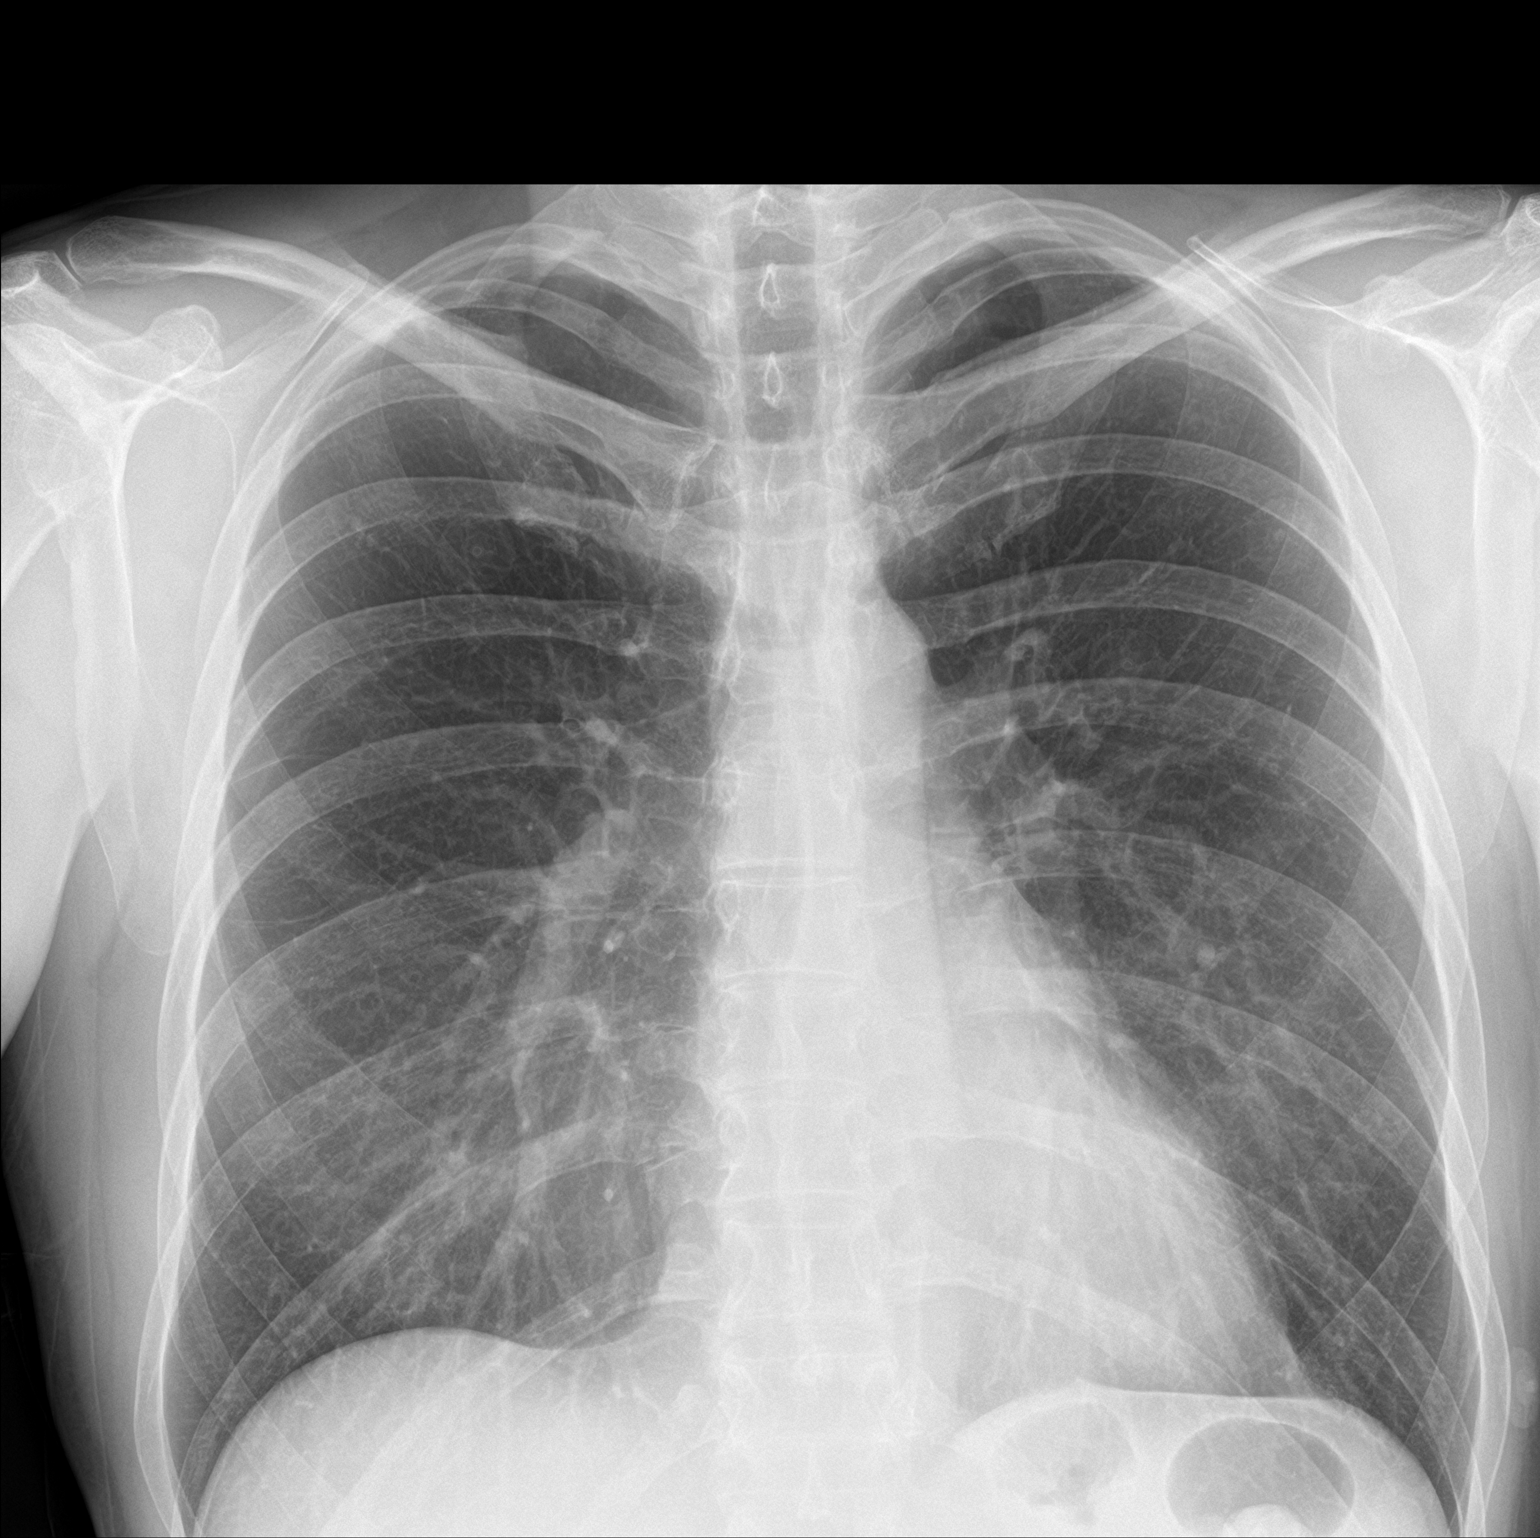

[chest lat]
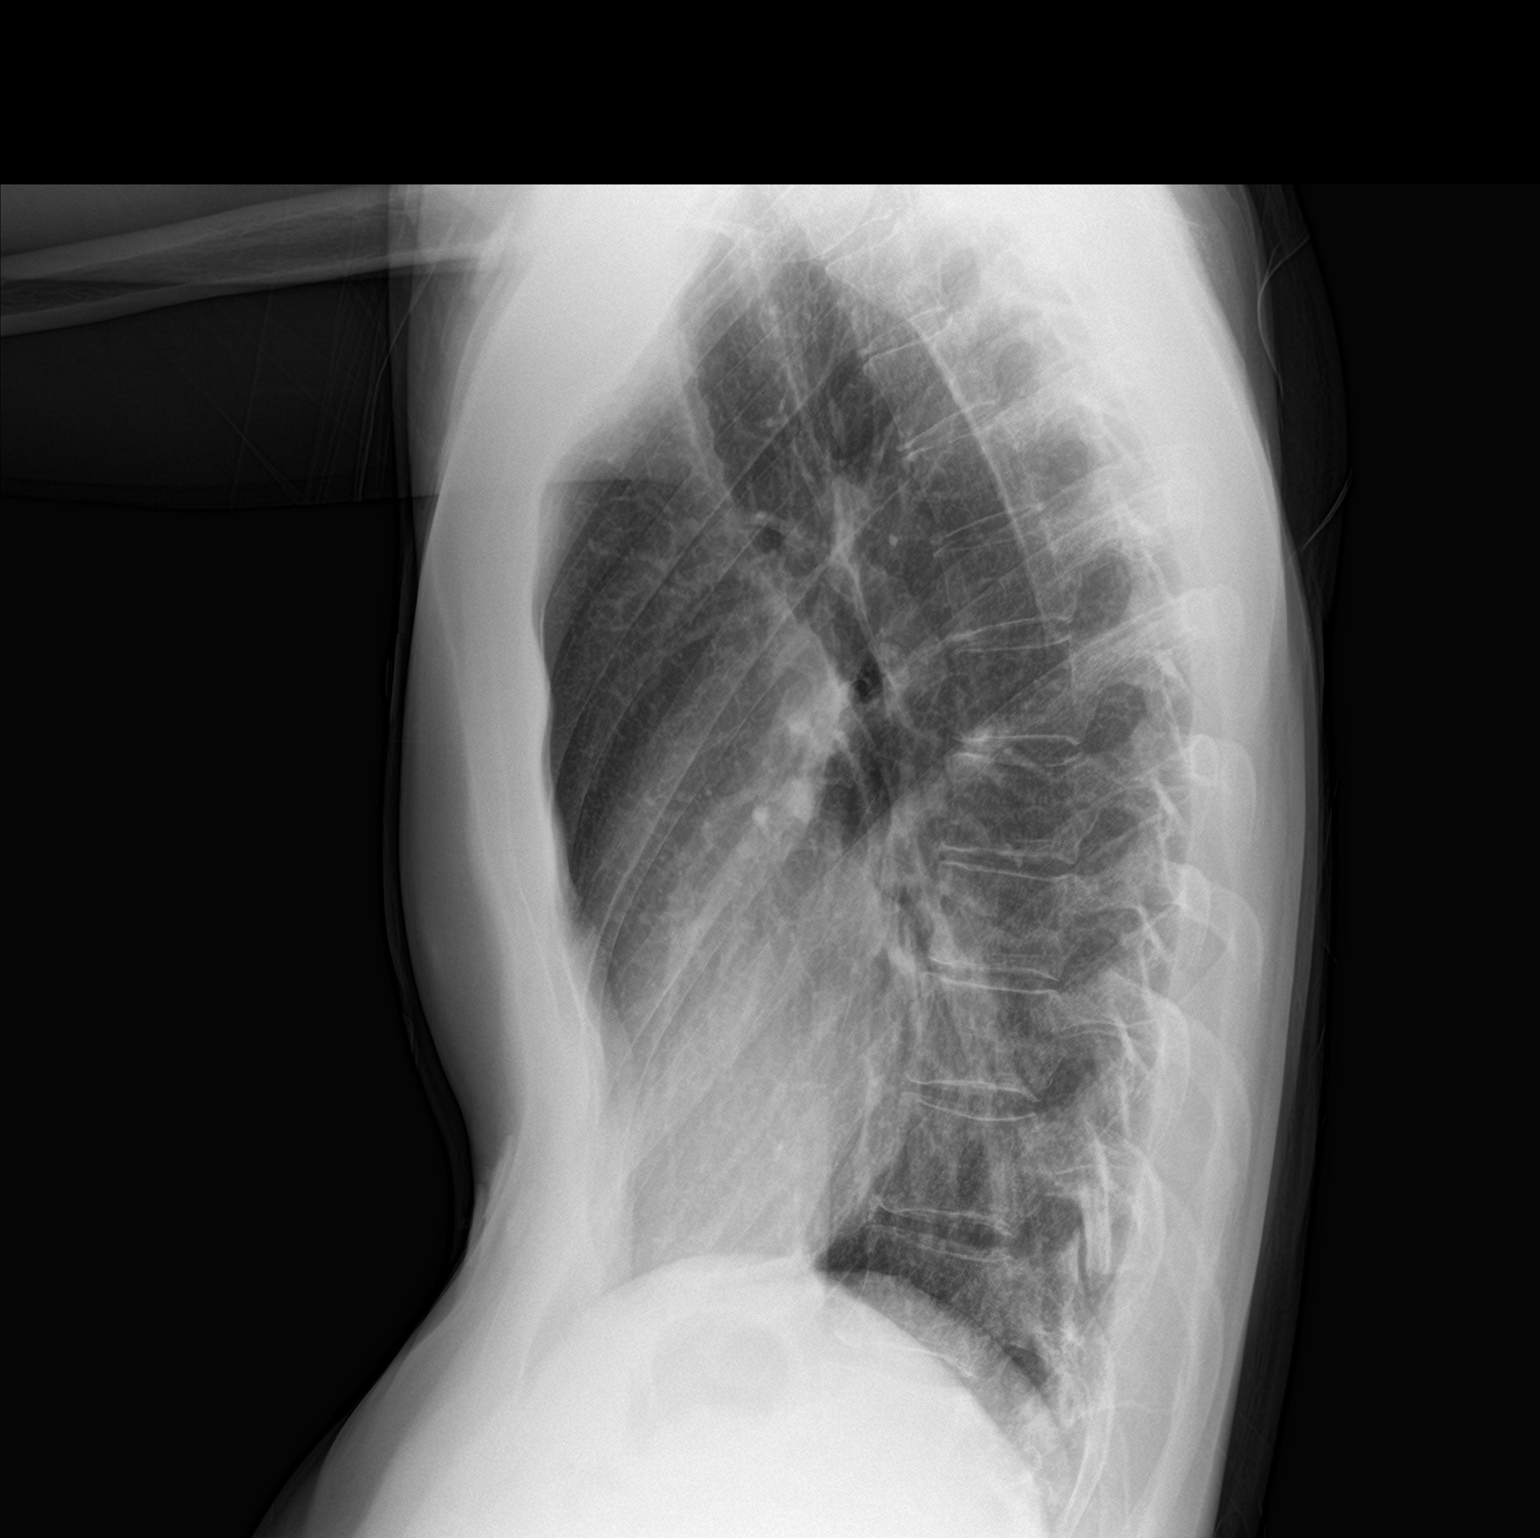

[2 of 2 positions shown; findings below may reference images not displayed]

FINDINGS: The heart size and mediastinal contours are within normal limits.
Both lungs are clear. The visualized skeletal structures are
unremarkable.
IMPRESSION: Negative.  No active cardiopulmonary disease.

## 2018-09-20 ENCOUNTER — Other Ambulatory Visit: Payer: Self-pay | Admitting: Family Medicine

## 2018-09-20 MED ORDER — DILTIAZEM HCL 30 MG PO TABS: ORAL_TABLET | ORAL | 3 refills | Status: DC

## 2018-09-22 ENCOUNTER — Ambulatory Visit: Payer: BC Managed Care – PPO | Admitting: Licensed Clinical Social Worker

## 2018-11-12 ENCOUNTER — Telehealth: Payer: Self-pay | Admitting: Family Medicine

## 2018-11-12 NOTE — Telephone Encounter (Signed)
Patient reports he did take Doxycycline last time.

## 2018-11-12 NOTE — Telephone Encounter (Signed)
What was he given last time doxycyclline?

## 2018-11-12 NOTE — Telephone Encounter (Signed)
PT takes an antibiotic several times year for chronic prostate problem, states he has noticed last few times he has took it that the issue comes back quicker. Pt wants to know if he thinks dr Christell Constant thinks he needs to change the antibiotic or do something else

## 2018-11-15 ENCOUNTER — Other Ambulatory Visit: Payer: Self-pay | Admitting: *Deleted

## 2018-11-15 DIAGNOSIS — N400 Enlarged prostate without lower urinary tract symptoms: Secondary | ICD-10-CM

## 2018-11-15 DIAGNOSIS — N411 Chronic prostatitis: Secondary | ICD-10-CM

## 2018-11-15 NOTE — Telephone Encounter (Signed)
Pt aware by detailed VM to come in for a urine sample. ordered

## 2018-11-15 NOTE — Telephone Encounter (Signed)
Let dr. Christell Constant decide- I do not know this patient very well

## 2018-11-15 NOTE — Telephone Encounter (Signed)
Have patient come in and get urine specimen clean-catch midstream for urinalysis and culture and sensitivity After that is returned we can decide if we need more antibiotics and which specific one whether it is doxycycline cephalexin or sulfa.  He will need treatment for 1 month.

## 2018-11-17 ENCOUNTER — Other Ambulatory Visit: Payer: Self-pay

## 2018-11-17 ENCOUNTER — Other Ambulatory Visit: Payer: BC Managed Care – PPO

## 2018-11-17 DIAGNOSIS — N400 Enlarged prostate without lower urinary tract symptoms: Secondary | ICD-10-CM

## 2018-11-17 DIAGNOSIS — N411 Chronic prostatitis: Secondary | ICD-10-CM

## 2018-11-17 LAB — MICROSCOPIC EXAMINATION
Bacteria, UA: NONE SEEN
Epithelial Cells (non renal): NONE SEEN /hpf (ref 0–10)
RBC, Urine: NONE SEEN /hpf (ref 0–2)
Renal Epithel, UA: NONE SEEN /hpf
WBC, UA: NONE SEEN /hpf (ref 0–5)

## 2018-11-17 LAB — URINALYSIS, COMPLETE
Bilirubin, UA: NEGATIVE
Glucose, UA: NEGATIVE
Ketones, UA: NEGATIVE
Leukocytes,UA: NEGATIVE
Nitrite, UA: NEGATIVE
Protein,UA: NEGATIVE
RBC, UA: NEGATIVE
Specific Gravity, UA: >1.03 — ABNORMAL HIGH (ref 1.005–1.030)
Urobilinogen, Ur: 0.2 mg/dL (ref 0.2–1.0)
pH, UA: 6.5 (ref 5.0–7.5)

## 2018-11-19 ENCOUNTER — Other Ambulatory Visit: Payer: Self-pay | Admitting: *Deleted

## 2018-11-19 LAB — URINE CULTURE: Organism ID, Bacteria: NO GROWTH

## 2018-11-19 MED ORDER — CEFDINIR 300 MG PO CAPS: 300.0000 mg | ORAL_CAPSULE | Freq: Two times a day (BID) | ORAL | 1 refills | Status: DC

## 2018-12-18 ENCOUNTER — Other Ambulatory Visit: Payer: Self-pay | Admitting: Family Medicine

## 2019-01-05 ENCOUNTER — Encounter: Payer: Self-pay | Admitting: Family Medicine

## 2019-01-05 ENCOUNTER — Ambulatory Visit (INDEPENDENT_AMBULATORY_CARE_PROVIDER_SITE_OTHER): Payer: BC Managed Care – PPO | Admitting: Family Medicine

## 2019-01-05 ENCOUNTER — Other Ambulatory Visit: Payer: Self-pay

## 2019-01-05 DIAGNOSIS — Q676 Pectus excavatum: Secondary | ICD-10-CM

## 2019-01-05 DIAGNOSIS — R002 Palpitations: Secondary | ICD-10-CM

## 2019-01-05 DIAGNOSIS — E782 Mixed hyperlipidemia: Secondary | ICD-10-CM

## 2019-01-05 DIAGNOSIS — H9202 Otalgia, left ear: Secondary | ICD-10-CM

## 2019-01-05 DIAGNOSIS — Z0000 Encounter for general adult medical examination without abnormal findings: Secondary | ICD-10-CM

## 2019-01-05 DIAGNOSIS — Z800 Family history of malignant neoplasm of digestive organs: Secondary | ICD-10-CM

## 2019-01-05 DIAGNOSIS — I341 Nonrheumatic mitral (valve) prolapse: Secondary | ICD-10-CM

## 2019-01-05 DIAGNOSIS — I480 Paroxysmal atrial fibrillation: Secondary | ICD-10-CM

## 2019-01-05 DIAGNOSIS — I493 Ventricular premature depolarization: Secondary | ICD-10-CM | POA: Diagnosis not present

## 2019-01-05 DIAGNOSIS — H938X2 Other specified disorders of left ear: Secondary | ICD-10-CM

## 2019-01-05 DIAGNOSIS — N411 Chronic prostatitis: Secondary | ICD-10-CM

## 2019-01-05 DIAGNOSIS — E559 Vitamin D deficiency, unspecified: Secondary | ICD-10-CM

## 2019-01-05 NOTE — Progress Notes (Signed)
Virtual Visit Via telephone Note I connected with@ on 01/05/19 by telephone and verified that I am speaking with the correct person or authorized healthcare agent using two identifiers. Charles Rasmussen is currently located at home and there are no unauthorized people in close proximity. I completed this visit while in a private location in my home .  This visit type was conducted due to national recommendations for restrictions regarding the COVID-19 Pandemic (e.g. social distancing).  This format is felt to be most appropriate for this patient at this time.  All issues noted in this document were discussed and addressed.  No physical exam was performed.    I discussed the limitations, risks, security and privacy concerns of performing an evaluation and management service by telephone and the availability of in person appointments. I also discussed with the patient that there may be a patient responsible charge related to this service. The patient expressed understanding and agreed to proceed.   Date:  01/05/2019    ID:  Charles Rasmussen      May 08, 1971        [id number]   Patient Care Team Patient Care Team: Chipper Herb, MD as PCP - General (Family Medicine) Larey Dresser, MD (Cardiology) Danella Sensing, MD (Dermatology) Sandrea Matte., MD (Ophthalmology)  Reason for Visit: Primary Care Follow-up     History of Present Illness & Review of Systems:     Charles Rasmussen is a 48 y.o. year old male primary care patient that presents today for a telehealth visit.  The patient is pleasant today and responded appropriately to questions asked of him.  He denies any chest pain and has had no occurrences of atrial fib for about a year and a half with his current treatment regimen.  He does have a little bit of pressure at times but attributes this to stress.  He denies any shortness of breath.  He takes a generic stool softener Rasmussen and has had no trouble with swallowing heartburn indigestion  nausea vomiting diarrhea or blood in the stool.  He was reminded that he does need to get his colonoscopy set up with Dr. Carlean Purl.  He denies any trouble with burning pain or frequency.  He has problems with his prostatitis is usually low back and groin pain even though he has negative urine specimens.  A most recent course of Omnicef for 2 weeks help control this.  He is in need to get an eye exam and he will schedule this.  He also needs a reschedule appointment with ear nose and throat because of popping in his ear.  Review of systems as stated, otherwise negative.  The patient does not have symptoms concerning for COVID-19 infection (fever, chills, cough, or new shortness of breath).      Current Medications (Verified) Allergies as of 01/05/2019   No Known Allergies     Medication List       Accurate as of January 05, 2019  9:25 AM. If you have any questions, ask your nurse or doctor.        Afluria Quadrivalent 0.5 ML injection Generic drug: Influenza vac split quadrivalent PF TO BE ADMINISTERED BY PHARMACIST FOR IMMUNIZATION   atenolol 25 MG tablet Commonly known as: TENORMIN Take 1 tablet (25 mg total) by mouth Rasmussen.   bisoprolol 5 MG tablet Commonly known as: ZEBETA 2.5 MG EVERY DAY  1/2 TAB   busPIRone 15 MG tablet Commonly known as: BUSPAR  Take 0.5-1 tab BID PRN   cefdinir 300 MG capsule Commonly known as: OMNICEF Take 1 capsule (300 mg total) by mouth 2 (two) times Rasmussen. 1 po BID   diltiazem 30 MG tablet Commonly known as: Cardizem Take 1 tablet every 4 hours AS NEEDED for AFIB heart rate >100 as long as blood pressure >100.   doxycycline 100 MG tablet Commonly known as: VIBRA-TABS TAKE 1 TABLET (100 MG TOTAL) BY MOUTH 2 (TWO) TIMES Rasmussen.   fluticasone 50 MCG/ACT nasal spray Commonly known as: FLONASE Place 2 sprays into both nostrils Rasmussen.   metoprolol tartrate 25 MG tablet Commonly known as: LOPRESSOR Take 1/2 (one-half) tablet by mouth twice Rasmussen    Na Sulfate-K Sulfate-Mg Sulf 17.5-3.13-1.6 GM/177ML Soln Commonly known as: Suprep Bowel Prep Kit Take 1 kit by mouth as directed.   TEARS RENEWED OP Place 1-2 drops into both eyes 3 (three) times Rasmussen as needed (for dry eyes).           Allergies (Verified)    Patient has no known allergies.  Past Medical History Past Medical History:  Diagnosis Date  . Atrial fibrillation ([place])   . Hemorrhoid thrombosis   . Mitral valve prolapse   . Unspecified hemorrhoids without mention of complication 9518  . Venous reflux 01/02/12   deep right per Nelda Severe. Kellie Simmering, M.D.     Past Surgical History:  Procedure Laterality Date  . MOUTH SURGERY     wisdom teeth  . neg hx      Social History   Socioeconomic History  . Marital status: Single    Spouse name: Not on file  . Number of children: 0  . Years of education: Not on file  . Highest education level: Not on file  Occupational History    Employer: [employer]  Social Needs  . Financial resource strain: Not on file  . Food insecurity    Worry: Not on file    Inability: Not on file  . Transportation needs    Medical: Not on file    Non-medical: Not on file  Tobacco Use  . Smoking status: Never Smoker  . Smokeless tobacco: Never Used  Substance and Sexual Activity  . Alcohol use: No  . Drug use: No  . Sexual activity: Not on file  Lifestyle  . Physical activity    Days per week: Not on file    Minutes per session: Not on file  . Stress: Not on file  Relationships  . Social Herbalist on phone: Not on file    Gets together: Not on file    Attends religious service: Not on file    Active member of club or organization: Not on file    Attends meetings of clubs or organizations: Not on file    Relationship status: Not on file  Other Topics Concern  . Not on file  Social History Narrative  . Not on file     Family History  Problem Relation Age of Onset  . Colon cancer Father 45  .  Rectal cancer Father   . Hypertension Father   . Stroke Mother   . Colon cancer Maternal Grandfather 88  . Rectal cancer Paternal Grandfather   . Heart attack Paternal Uncle       Labs/Other Tests and Data Reviewed:    Wt Readings from Last 3 Encounters:  06/30/18 200 lb (90.7 kg)  04/26/18 198 lb (89.8 kg)  12/30/17 194 lb (  88 kg)   Temp Readings from Last 3 Encounters:  06/30/18 (!) 97.4 F (36.3 C) (Oral)  04/26/18 97.9 F (36.6 C) (Oral)  12/30/17 98.4 F (36.9 C) (Oral)   BP Readings from Last 3 Encounters:  06/30/18 108/64  04/26/18 113/78  12/30/17 113/79   Pulse Readings from Last 3 Encounters:  06/30/18 66  04/26/18 77  12/30/17 73     No results found for: HGBA1C Lab Results  Component Value Date   LDLCALC 136 (H) 03/05/2017   CREATININE 0.97 03/05/2017       Chemistry      Component Value Date/Time   NA 138 03/05/2017 1258   K 4.5 03/05/2017 1258   CL 102 03/05/2017 1258   CO2 24 03/05/2017 1258   BUN 13 03/05/2017 1258   CREATININE 0.97 03/05/2017 1258      Component Value Date/Time   CALCIUM 9.5 03/05/2017 1258   ALKPHOS 69 03/05/2017 1258   AST 25 03/05/2017 1258   ALT 21 03/05/2017 1258   BILITOT 0.6 03/05/2017 1258         OBSERVATIONS/ OBJECTIVE:     The patient is alert and responded appropriately to questions asked of him.  His weight is stable at about 200 pounds.  He does not have any blood pressure readings to report.  He does have his feet were checked Rasmussen and he has not been running any fever.  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  He was not able to keep any of his appointments with ear nose and throat because of the popping sensation in his ear and would like for [place] to reschedule that for him.  Physical exam deferred due to nature of telephonic visit.  ASSESSMENT & PLAN    Time:   Today, I have spent 28 minutes with the patient via telephone discussing the above including Covid  precautions.     Visit Diagnoses: 1. MVP (mitral valve prolapse) -Continue to follow-up with Dr. Caryl Comes and take medications as he is recommended  2. PVC (premature ventricular contraction) -The need to follow-up with Dr. Caryl Comes and continue with metoprolol as currently doing  3. Pectus excavatum -This is an ongoing issue and patient is having no problems with this.  4. Chronic prostatitis -Patient has chronic prostatitis and periodically needs refills on doxycycline Septra or cefdinir.  Recently the cefdinir  5. Mixed hyperlipidemia -Continue with aggressive therapeutic lifestyle changes  6. Palpitations -Continue with metoprolol as currently doing  7. Vitamin D deficiency -Take vitamin D if needed after blood work is returned  8. Paroxysmal atrial fibrillation (HCC) -Follow-up with Dr. Caryl Comes as planned and continue with current medicines  9. Family history of rectal cancer -Schedule visit with Dr. Carlean Purl to get colonoscopy procedure done for following up on this.  Patient Instructions  Continue to practice good hand and respiratory hygiene Continue to keep caffeine out of your diet Follow-up with cardiology as planned Remember that with a family history of rectal cancer you need to get colonoscopies every 5 years--- the schedule visit with Dr. Carlean Purl for this procedure Always drink plenty of fluids and stay well-hydrated Notify PCP of any change in bowel habits or blood in the stool Please schedule visit for eye exam We will schedule visit with ear nose and throat for follow-up of popping in the ears. Please come to the office for fasting lab work and schedule this with my nurse at the proper time to come  The above assessment and management plan was discussed with the patient. The patient verbalized understanding of and has agreed to the management plan. Patient is aware to call the clinic if symptoms persist or worsen. Patient is aware when to return to the clinic  for a follow-up visit. Patient educated on when it is appropriate to go to the emergency department.    Chipper Herb, MD [facility], [place], [place] [address] Ph [phone]   Arrie Senate MD

## 2019-01-05 NOTE — Patient Instructions (Addendum)
Continue to practice good hand and respiratory hygiene Continue to keep caffeine out of your diet Follow-up with cardiology as planned Remember that with a family history of rectal cancer you need to get colonoscopies every 5 years--- the schedule visit with Dr. Carlean Purl for this procedure Always drink plenty of fluids and stay well-hydrated Notify PCP of any change in bowel habits or blood in the stool Please schedule visit for eye exam We will schedule visit with ear nose and throat for follow-up of popping in the ears. Please come to the office for fasting lab work and schedule this with my nurse at the proper time to come

## 2019-01-06 MED ORDER — CEFDINIR 300 MG PO CAPS: 300.0000 mg | ORAL_CAPSULE | Freq: Two times a day (BID) | ORAL | 1 refills | Status: DC

## 2019-01-06 MED ORDER — DOXYCYCLINE HYCLATE 100 MG PO TABS: ORAL_TABLET | ORAL | 1 refills | Status: DC

## 2019-01-06 NOTE — Addendum Note (Signed)
Addended by: Zannie Cove on: 01/06/2019 09:54 AM   Modules accepted: Orders

## 2019-01-18 ENCOUNTER — Other Ambulatory Visit: Payer: Self-pay

## 2019-01-18 ENCOUNTER — Other Ambulatory Visit: Payer: BC Managed Care – PPO

## 2019-01-18 DIAGNOSIS — E559 Vitamin D deficiency, unspecified: Secondary | ICD-10-CM

## 2019-01-18 DIAGNOSIS — I493 Ventricular premature depolarization: Secondary | ICD-10-CM

## 2019-01-18 DIAGNOSIS — R002 Palpitations: Secondary | ICD-10-CM

## 2019-01-18 DIAGNOSIS — Z800 Family history of malignant neoplasm of digestive organs: Secondary | ICD-10-CM

## 2019-01-18 DIAGNOSIS — E782 Mixed hyperlipidemia: Secondary | ICD-10-CM

## 2019-01-18 DIAGNOSIS — H938X2 Other specified disorders of left ear: Secondary | ICD-10-CM

## 2019-01-18 DIAGNOSIS — I341 Nonrheumatic mitral (valve) prolapse: Secondary | ICD-10-CM

## 2019-01-18 DIAGNOSIS — N411 Chronic prostatitis: Secondary | ICD-10-CM

## 2019-01-18 DIAGNOSIS — Z0000 Encounter for general adult medical examination without abnormal findings: Secondary | ICD-10-CM

## 2019-01-18 DIAGNOSIS — I480 Paroxysmal atrial fibrillation: Secondary | ICD-10-CM

## 2019-01-18 DIAGNOSIS — Q676 Pectus excavatum: Secondary | ICD-10-CM

## 2019-01-18 DIAGNOSIS — H9202 Otalgia, left ear: Secondary | ICD-10-CM

## 2019-01-18 LAB — URINALYSIS, COMPLETE
Bilirubin, UA: NEGATIVE
Glucose, UA: NEGATIVE
Ketones, UA: NEGATIVE
Leukocytes,UA: NEGATIVE
Nitrite, UA: NEGATIVE
Protein,UA: NEGATIVE
Specific Gravity, UA: 1.025 (ref 1.005–1.030)
Urobilinogen, Ur: 0.2 mg/dL (ref 0.2–1.0)
pH, UA: 5.5 (ref 5.0–7.5)

## 2019-01-18 LAB — MICROSCOPIC EXAMINATION
Bacteria, UA: NONE SEEN
Epithelial Cells (non renal): NONE SEEN /hpf (ref 0–10)
Renal Epithel, UA: NONE SEEN /hpf
WBC, UA: NONE SEEN /hpf (ref 0–5)

## 2019-01-19 LAB — CBC WITH DIFFERENTIAL/PLATELET
Basophils Absolute: 0 10*3/uL (ref 0.0–0.2)
Basos: 1 %
EOS (ABSOLUTE): 0.1 10*3/uL (ref 0.0–0.4)
Eos: 3 %
Hematocrit: 43.2 % (ref 37.5–51.0)
Hemoglobin: 14.6 g/dL (ref 13.0–17.7)
Immature Grans (Abs): 0 10*3/uL (ref 0.0–0.1)
Immature Granulocytes: 0 %
Lymphocytes Absolute: 1.7 10*3/uL (ref 0.7–3.1)
Lymphs: 32 %
MCH: 29.7 pg (ref 26.6–33.0)
MCHC: 33.8 g/dL (ref 31.5–35.7)
MCV: 88 fL (ref 79–97)
Monocytes Absolute: 0.4 10*3/uL (ref 0.1–0.9)
Monocytes: 8 %
Neutrophils Absolute: 3 10*3/uL (ref 1.4–7.0)
Neutrophils: 56 %
Platelets: 302 10*3/uL (ref 150–450)
RBC: 4.92 x10E6/uL (ref 4.14–5.80)
RDW: 12 % (ref 11.6–15.4)
WBC: 5.3 10*3/uL (ref 3.4–10.8)

## 2019-01-19 LAB — LIPID PANEL
Chol/HDL Ratio: 3.8 ratio (ref 0.0–5.0)
Cholesterol, Total: 214 mg/dL — ABNORMAL HIGH (ref 100–199)
HDL: 57 mg/dL (ref >39)
LDL Calculated: 138 mg/dL — ABNORMAL HIGH (ref 0–99)
Triglycerides: 95 mg/dL (ref 0–149)
VLDL Cholesterol Cal: 19 mg/dL (ref 5–40)

## 2019-01-19 LAB — BMP8+EGFR
BUN/Creatinine Ratio: 12 (ref 9–20)
BUN: 12 mg/dL (ref 6–24)
CO2: 21 mmol/L (ref 20–29)
Calcium: 9.4 mg/dL (ref 8.7–10.2)
Chloride: 104 mmol/L (ref 96–106)
Creatinine, Ser: 1.02 mg/dL (ref 0.76–1.27)
GFR calc Af Amer: 99 mL/min/{1.73_m2} (ref >59)
GFR calc non Af Amer: 86 mL/min/{1.73_m2} (ref >59)
Glucose: 102 mg/dL — ABNORMAL HIGH (ref 65–99)
Potassium: 4.1 mmol/L (ref 3.5–5.2)
Sodium: 141 mmol/L (ref 134–144)

## 2019-01-19 LAB — HEPATIC FUNCTION PANEL
ALT: 16 IU/L (ref 0–44)
AST: 19 IU/L (ref 0–40)
Albumin: 4.8 g/dL (ref 4.0–5.0)
Alkaline Phosphatase: 76 IU/L (ref 39–117)
Bilirubin Total: 0.5 mg/dL (ref 0.0–1.2)
Bilirubin, Direct: 0.13 mg/dL (ref 0.00–0.40)
Total Protein: 7.3 g/dL (ref 6.0–8.5)

## 2019-01-19 LAB — VITAMIN D 25 HYDROXY (VIT D DEFICIENCY, FRACTURES): Vit D, 25-Hydroxy: 27.3 ng/mL — ABNORMAL LOW (ref 30.0–100.0)

## 2019-01-19 LAB — THYROID PANEL WITH TSH
Free Thyroxine Index: 1.2 (ref 1.2–4.9)
T3 Uptake Ratio: 26 % (ref 24–39)
T4, Total: 4.6 ug/dL (ref 4.5–12.0)
TSH: 3.47 u[IU]/mL (ref 0.450–4.500)

## 2019-01-28 ENCOUNTER — Encounter: Payer: Self-pay | Admitting: *Deleted

## 2019-02-08 ENCOUNTER — Telehealth: Payer: Self-pay | Admitting: Family Medicine

## 2019-02-08 NOTE — Telephone Encounter (Signed)
LMTCB

## 2019-02-08 NOTE — Telephone Encounter (Signed)
Information sent to Dr Warrick Parisian

## 2019-02-08 NOTE — Telephone Encounter (Signed)
Pt frequently gets prostate infections. Dr. Laurance Flatten has always prescribed antibiotics and he keeps some on hand. He currently has omnicef. Can he take or would you like to see him. Pt aware out of office today

## 2019-02-09 NOTE — Telephone Encounter (Signed)
If he currently has Omnicef and have him go ahead and take it, take it for at least 2 weeks twice a day, if does not improve or worsen then have him come in to be seen

## 2019-02-09 NOTE — Telephone Encounter (Signed)
Left detailed message on patient's voice mail.

## 2019-02-21 ENCOUNTER — Telehealth: Payer: Self-pay | Admitting: Family Medicine

## 2019-02-21 NOTE — Telephone Encounter (Signed)
**  [facility] After Hours/ Emergency Line Call**  Patient: Charles Rasmussen.  PCP: Dettinger, Fransisca Kaufmann, MD  Patient reports 2-day history of mild right sided stomach pain that is just beneath the costal margin.  He denies any associated nausea, vomiting, skin discoloration, fevers, chills, abnormal stool patterns or blood in stool.  He does not find it to be associated with any particular foods.  Denies any associated shortness of breath or diaphoresis.  It resolves after taking a deep breath 2 or 3 times.  We discussed possible etiologies, which included GERD, cardiac etiology and aortic etiology.  I reviewed his last abdominal ultrasound which demonstrated no aneurysms and no abnormal gallbladder findings.  I have recommended that he proceed with OTC Prilosec and see if perhaps this might improve his symptoms.  Red flags discussed.  Will forward to PCP.  Ferd Horrigan M. Lajuana Ripple, DO

## 2019-02-22 ENCOUNTER — Other Ambulatory Visit: Payer: Self-pay

## 2019-02-22 ENCOUNTER — Ambulatory Visit: Payer: BC Managed Care – PPO | Admitting: Family Medicine

## 2019-02-22 ENCOUNTER — Encounter: Payer: Self-pay | Admitting: Family Medicine

## 2019-02-22 VITALS — BP 118/72 | HR 80 | Temp 98.2°F | Ht 75.75 in | Wt 194.0 lb

## 2019-02-22 DIAGNOSIS — K2100 Gastro-esophageal reflux disease with esophagitis, without bleeding: Secondary | ICD-10-CM

## 2019-02-22 MED ORDER — PANTOPRAZOLE SODIUM 40 MG PO TBEC: 40.0000 mg | DELAYED_RELEASE_TABLET | Freq: Every day | ORAL | 11 refills | Status: DC

## 2019-02-22 NOTE — Progress Notes (Signed)
Subjective:  Patient ID: Charles Rasmussen, male    DOB: 1970-09-11  Age: 48 y.o. MRN: [id number]  CC: Abdominal Pain (RUQ and right flank area pain and discomfort )   HPI Charles Rasmussen presents for RUG to epigastric discopmfort starting 4 days ago. Has had indigestion requiring Tums when eating spicy foods in the past. The discomfort occurs with swallowing.  The food will seem to catch somewhat at the xiphoid region.  Then it is gone down.  The pain will be mild but lasts for a short time after swallowing as well.  He says it has been a bit more steady today.  He did not pick up the PPI that Dr. Lajuana Ripple had recommended by phone on Saturday.  He mentions that the pain does radiate to the shoulder blade area.  Chart review shows that he had abdominal ultrasound performed April 14 of 2017.  This showed no gallstones or acute hepatobiliary abnormality etc. at that time.  At that time he was being evaluated for left-sided abdominal pain.  Depression screen [facility] 2/9 02/22/2019 06/30/2018 04/26/2018  Decreased Interest 0 0 0  Down, Depressed, Hopeless 0 0 0  PHQ - 2 Score 0 0 0    History Charles Rasmussen has a past medical history of Anxiety, Atrial fibrillation ([place]), Hemorrhoid thrombosis, Mitral valve prolapse, Unspecified hemorrhoids without mention of complication (6151), and Venous reflux (01/02/12).   He has a past surgical history that includes neg hx and Mouth surgery.   His family history includes Colon cancer (age of onset: 21) in his father; Colon cancer (age of onset: 58) in his maternal grandfather; Heart attack in his paternal uncle; Hypertension in his father; Rectal cancer in his father and paternal grandfather; Stroke in his mother.He reports that he has never smoked. He has never used smokeless tobacco. He reports that he does not drink alcohol or use drugs.    ROS Review of Systems  Constitutional: Negative for chills, diaphoresis, fever and unexpected weight change.  HENT: Negative for  rhinorrhea and trouble swallowing.   Respiratory: Negative for cough, chest tightness and shortness of breath.   Cardiovascular: Negative for chest pain.  Gastrointestinal: Positive for abdominal pain. Negative for abdominal distention, blood in stool, constipation, diarrhea, nausea, rectal pain and vomiting.  Genitourinary: Negative for dysuria, flank pain and hematuria.  Musculoskeletal: Negative for arthralgias and joint swelling.  Skin: Negative for rash.  Neurological: Negative for syncope and headaches.    Objective:  BP 118/72   Pulse 80   Temp 98.2 F (36.8 C)   Ht 6' 3.75" (1.924 m)   Wt 194 lb (88 kg)   BMI 23.77 kg/m   BP Readings from Last 3 Encounters:  02/22/19 118/72  06/30/18 108/64  04/26/18 113/78    Wt Readings from Last 3 Encounters:  02/22/19 194 lb (88 kg)  06/30/18 200 lb (90.7 kg)  04/26/18 198 lb (89.8 kg)     Physical Exam Vitals signs reviewed.  Constitutional:      Appearance: He is well-developed.  HENT:     Head: Normocephalic and atraumatic.     Right Ear: Tympanic membrane and external ear normal. No decreased hearing noted.     Left Ear: Tympanic membrane and external ear normal. No decreased hearing noted.     Mouth/Throat:     Pharynx: No oropharyngeal exudate or posterior oropharyngeal erythema.  Eyes:     Pupils: Pupils are equal, round, and reactive to light.  Neck:  Musculoskeletal: Normal range of motion and neck supple.  Cardiovascular:     Rate and Rhythm: Normal rate and regular rhythm.     Heart sounds: No murmur.  Pulmonary:     Effort: No respiratory distress.     Breath sounds: Normal breath sounds.  Abdominal:     General: Bowel sounds are normal.     Palpations: Abdomen is soft. There is no mass.     Tenderness: There is abdominal tenderness in the right upper quadrant. There is right CVA tenderness (mild). There is no left CVA tenderness, guarding or rebound. Negative signs include Murphy's sign and  McBurney's sign.     Hernia: There is no hernia in the umbilical area or ventral area.       Assessment & Plan:   Antoine was seen today for abdominal pain.  Diagnoses and all orders for this visit:  Gastroesophageal reflux disease with esophagitis  Other orders -     pantoprazole (PROTONIX) 40 MG tablet; Take 1 tablet (40 mg total) by mouth daily. For stomach       I have discontinued Mannie M. Mcminn's Na Sulfate-K Sulfate-Mg Sulf, Afluria Quadrivalent, bisoprolol, busPIRone, fluticasone, atenolol, doxycycline, and cefdinir. I am also having him start on pantoprazole. Additionally, I am having him maintain his Artificial Tear Solution (TEARS RENEWED OP), diltiazem, and metoprolol tartrate.  Allergies as of 02/22/2019   No Known Allergies     Medication List       Accurate as of February 22, 2019  9:18 AM. If you have any questions, ask your nurse or doctor.        STOP taking these medications   Afluria Quadrivalent 0.5 ML injection Generic drug: Influenza vac split quadrivalent PF Stopped by: Claretta Fraise, MD   atenolol 25 MG tablet Commonly known as: TENORMIN Stopped by: Claretta Fraise, MD   bisoprolol 5 MG tablet Commonly known as: ZEBETA Stopped by: Claretta Fraise, MD   busPIRone 15 MG tablet Commonly known as: BUSPAR Stopped by: Claretta Fraise, MD   cefdinir 300 MG capsule Commonly known as: OMNICEF Stopped by: Claretta Fraise, MD   doxycycline 100 MG tablet Commonly known as: VIBRA-TABS Stopped by: Claretta Fraise, MD   fluticasone 50 MCG/ACT nasal spray Commonly known as: FLONASE Stopped by: Claretta Fraise, MD   Na Sulfate-K Sulfate-Mg Sulf 17.5-3.13-1.6 GM/177ML Soln Commonly known as: Suprep Bowel Prep Kit Stopped by: Claretta Fraise, MD     TAKE these medications   diltiazem 30 MG tablet Commonly known as: Cardizem Take 1 tablet every 4 hours AS NEEDED for AFIB heart rate >100 as long as blood pressure >100.   metoprolol tartrate 25 MG tablet  Commonly known as: LOPRESSOR Take 1/2 (one-half) tablet by mouth twice daily   pantoprazole 40 MG tablet Commonly known as: PROTONIX Take 1 tablet (40 mg total) by mouth daily. For stomach Started by: Claretta Fraise, MD   TEARS RENEWED OP Place 1-2 drops into both eyes 3 (three) times daily as needed (for dry eyes).        Follow-up: Return in 6 weeks (on 04/05/2019), or if symptoms worsen or fail to improve.  Claretta Fraise, M.D.

## 2019-02-22 NOTE — Patient Instructions (Signed)
Use OTC Pepcid AC as needed

## 2019-03-16 ENCOUNTER — Other Ambulatory Visit: Payer: Self-pay | Admitting: Family Medicine

## 2019-04-01 ENCOUNTER — Telehealth: Payer: Self-pay | Admitting: Family Medicine

## 2019-04-01 DIAGNOSIS — N411 Chronic prostatitis: Secondary | ICD-10-CM

## 2019-04-01 NOTE — Telephone Encounter (Signed)
REFERRAL REQUEST Telephone Note  What type of referral do you need? urology  Have you been seen at our office for this problem? No but its for chronic prostate infection (Advise that they will likely need an appointment with their PCP before a referral can be done)  Is there a particular doctor or location that you prefer? Anywhere is fine  Patient notified that referrals can take up to a week or longer to process. If they haven't heard anything within a week they should call back and speak with the referral department.

## 2019-04-01 NOTE — Telephone Encounter (Signed)
Patient aware.

## 2019-04-01 NOTE — Telephone Encounter (Signed)
Please let the patient know that we place her urology referral for him Caryl Pina, MD [place] 04/01/2019, 4:02 PM

## 2019-04-21 ENCOUNTER — Ambulatory Visit (INDEPENDENT_AMBULATORY_CARE_PROVIDER_SITE_OTHER): Payer: BC Managed Care – PPO | Admitting: Family Medicine

## 2019-04-21 ENCOUNTER — Encounter: Payer: Self-pay | Admitting: Family Medicine

## 2019-04-21 DIAGNOSIS — N411 Chronic prostatitis: Secondary | ICD-10-CM

## 2019-04-21 NOTE — Progress Notes (Signed)
Virtual Visit via telephone Note  I connected with Charles Rasmussen on 04/21/19 at 1415 by telephone and verified that I am speaking with the correct person using two identifiers. Charles Rasmussen is currently located at work and no other people are currently with her during visit. The provider, Charles Kaufmann Emil Klassen, MD is located in their office at time of visit.  Call ended at 1629  I discussed the limitations, risks, security and privacy concerns of performing an evaluation and management service by telephone and the availability of in person appointments. I also discussed with the patient that there may be a patient responsible charge related to this service. The patient expressed understanding and agreed to proceed.   History and Present Illness: Patient is calling in with complaints of issues with his prostate causing him pain and difficulties with urination.  He gets this frequently and has been treated doxycycline.  He gets pressure over his hip and it is coming back sooner than it has done previously. Patient has an appt with a urologist the 21st.  He hadn't seen urology in 10 years.  Dr Gloriann Loan with [facility]. It is a mild discomfort and he is concerned about how fast it could worse.   No diagnosis found.  Outpatient Encounter Medications as of 04/21/2019  Medication Sig  . Artificial Tear Solution (TEARS RENEWED OP) Place 1-2 drops into both eyes 3 (three) times daily as needed (for dry eyes).  Marland Kitchen diltiazem (CARDIZEM) 30 MG tablet Take 1 tablet every 4 hours AS NEEDED for AFIB heart rate >100 as long as blood pressure >100.  . metoprolol tartrate (LOPRESSOR) 25 MG tablet Take 1/2 (one-half) tablet by mouth twice daily  . pantoprazole (PROTONIX) 40 MG tablet Take 1 tablet (40 mg total) by mouth daily. For stomach   No facility-administered encounter medications on file as of 04/21/2019.     Review of Systems  Constitutional: Negative for chills and fever.  Respiratory: Negative for  shortness of breath and wheezing.   Cardiovascular: Negative for chest pain and leg swelling.  Genitourinary: Negative for decreased urine volume, dysuria, hematuria, scrotal swelling, testicular pain and urgency.  Musculoskeletal: Negative for back pain and gait problem.  Skin: Negative for rash.  All other systems reviewed and are negative.   Observations/Objective: Patient sounds comfortable and in no acute distress  Assessment and Plan: Problem List Items Addressed This Visit      Genitourinary   Chronic prostatitis - Primary       Follow Up Instructions: Will f/u after urology.  Did not do an antibiotic today because his symptoms are only mild and we want the urologist to be able to see it but if anything worsens he will call and we can send a medication.    I discussed the assessment and treatment plan with the patient. The patient was provided an opportunity to ask questions and all were answered. The patient agreed with the plan and demonstrated an understanding of the instructions.   The patient was advised to call back or seek an in-person evaluation if the symptoms worsen or if the condition fails to improve as anticipated.  The above assessment and management plan was discussed with the patient. The patient verbalized understanding of and has agreed to the management plan. Patient is aware to call the clinic if symptoms persist or worsen. Patient is aware when to return to the clinic for a follow-up visit. Patient educated on when it is appropriate to go to  the emergency department.    I provided 14 minutes of non-face-to-face time during this encounter.    Nils Pyle, MD

## 2019-04-26 ENCOUNTER — Ambulatory Visit: Payer: BC Managed Care – PPO | Admitting: Family Medicine

## 2019-04-28 ENCOUNTER — Encounter: Payer: Self-pay | Admitting: Family Medicine

## 2019-04-28 MED ORDER — DOXYCYCLINE HYCLATE 100 MG PO TABS: 100.0000 mg | ORAL_TABLET | Freq: Two times a day (BID) | ORAL | 0 refills | Status: DC

## 2019-05-19 ENCOUNTER — Encounter: Payer: Self-pay | Admitting: Family Medicine

## 2019-05-19 ENCOUNTER — Ambulatory Visit (INDEPENDENT_AMBULATORY_CARE_PROVIDER_SITE_OTHER): Payer: BC Managed Care – PPO | Admitting: Family Medicine

## 2019-05-19 DIAGNOSIS — M545 Low back pain, unspecified: Secondary | ICD-10-CM

## 2019-05-19 DIAGNOSIS — M25552 Pain in left hip: Secondary | ICD-10-CM | POA: Diagnosis not present

## 2019-05-19 DIAGNOSIS — R1032 Left lower quadrant pain: Secondary | ICD-10-CM | POA: Diagnosis not present

## 2019-05-19 NOTE — Progress Notes (Signed)
Virtual Visit via telephone Note  I connected with Charles Rasmussen on 05/19/19 at 1322 by telephone and verified that I am speaking with the correct person using two identifiers. Charles Rasmussen is currently located at work and no other people are currently with her during visit. The provider, Fransisca Kaufmann Vinson Tietze, MD is located in their office at time of visit.  Call ended at 1345  I discussed the limitations, risks, security and privacy concerns of performing an evaluation and management service by telephone and the availability of in person appointments. I also discussed with the patient that there may be a patient responsible charge related to this service. The patient expressed understanding and agreed to proceed.   History and Present Illness: Patient is calling in about urology visit and chronic prostatitis. Pain is right hip and goes down into groin.  The urologist set up a PT referral because he did not think it was related to the prostate but thought maybe it was more related to the hip or the back.  He will go do the PT but then we will also talk about doing an MRI because is been chronic and has been going on for almost 20 years where he keeps getting this recurrent pain and pressure in the left groin and sometimes with that he gets low back pain as well as he admits.  Patient denies any numbness or weakness or shooting pain down either leg.  Pain is on the left groin  No diagnosis found.  Outpatient Encounter Medications as of 05/19/2019  Medication Sig  . Artificial Tear Solution (TEARS RENEWED OP) Place 1-2 drops into both eyes 3 (three) times daily as needed (for dry eyes).  Marland Kitchen diltiazem (CARDIZEM) 30 MG tablet Take 1 tablet every 4 hours AS NEEDED for AFIB heart rate >100 as long as blood pressure >100.  Marland Kitchen doxycycline (VIBRA-TABS) 100 MG tablet Take 1 tablet (100 mg total) by mouth 2 (two) times daily. 1 po bid  . metoprolol tartrate (LOPRESSOR) 25 MG tablet Take 1/2 (one-half) tablet  by mouth twice daily  . pantoprazole (PROTONIX) 40 MG tablet Take 1 tablet (40 mg total) by mouth daily. For stomach   No facility-administered encounter medications on file as of 05/19/2019.     Review of Systems  Constitutional: Negative for chills and fever.  Eyes: Negative for visual disturbance.  Respiratory: Negative for shortness of breath and wheezing.   Cardiovascular: Negative for chest pain and leg swelling.  Gastrointestinal: Positive for abdominal distention and abdominal pain.  Genitourinary: Negative for decreased urine volume, difficulty urinating, frequency, hematuria and urgency.  Musculoskeletal: Negative for back pain and gait problem.  Skin: Negative for rash.  Neurological: Negative for dizziness, weakness and numbness.  All other systems reviewed and are negative.   Observations/Objective: Patient sounds comfortable and in no acute distress  Assessment and Plan: Problem List Items Addressed This Visit    None    Visit Diagnoses    Left lower quadrant abdominal pain    -  Primary   Relevant Orders   MR LUMBAR SPINE WO CONTRAST   Left hip pain       Relevant Orders   MR LUMBAR SPINE WO CONTRAST   Acute left-sided low back pain without sciatica       Relevant Orders   MR LUMBAR SPINE WO CONTRAST    Order to MRI because of the chronic issues with this pain in the left hip and groin and back, has  been going on for more than 20 years  Follow Up Instructions: Follow-up after MRI    I discussed the assessment and treatment plan with the patient. The patient was provided an opportunity to ask questions and all were answered. The patient agreed with the plan and demonstrated an understanding of the instructions.   The patient was advised to call back or seek an in-person evaluation if the symptoms worsen or if the condition fails to improve as anticipated.  The above assessment and management plan was discussed with the patient. The patient verbalized  understanding of and has agreed to the management plan. Patient is aware to call the clinic if symptoms persist or worsen. Patient is aware when to return to the clinic for a follow-up visit. Patient educated on when it is appropriate to go to the emergency department.    I provided 23 minutes of non-face-to-face time during this encounter.    Nils Pyle, MD

## 2019-05-25 ENCOUNTER — Encounter (HOSPITAL_COMMUNITY): Payer: Self-pay | Admitting: Nurse Practitioner

## 2019-05-25 ENCOUNTER — Ambulatory Visit (HOSPITAL_COMMUNITY)
Admission: RE | Admit: 2019-05-25 | Discharge: 2019-05-25 | Disposition: A | Discharge status: Home / Self Care | Payer: BC Managed Care – PPO | Source: Ambulatory Visit | Attending: Nurse Practitioner | Admitting: Nurse Practitioner

## 2019-05-25 ENCOUNTER — Other Ambulatory Visit: Payer: Self-pay

## 2019-05-25 VITALS — BP 110/76 | HR 80 | Ht 75.75 in | Wt 192.4 lb

## 2019-05-25 DIAGNOSIS — I341 Nonrheumatic mitral (valve) prolapse: Secondary | ICD-10-CM | POA: Insufficient documentation

## 2019-05-25 DIAGNOSIS — Z800 Family history of malignant neoplasm of digestive organs: Secondary | ICD-10-CM | POA: Insufficient documentation

## 2019-05-25 DIAGNOSIS — I480 Paroxysmal atrial fibrillation: Secondary | ICD-10-CM | POA: Diagnosis not present

## 2019-05-25 DIAGNOSIS — Z79899 Other long term (current) drug therapy: Secondary | ICD-10-CM | POA: Diagnosis not present

## 2019-05-25 DIAGNOSIS — I4510 Unspecified right bundle-branch block: Secondary | ICD-10-CM | POA: Diagnosis not present

## 2019-05-25 DIAGNOSIS — Z823 Family history of stroke: Secondary | ICD-10-CM | POA: Insufficient documentation

## 2019-05-25 DIAGNOSIS — Z8059 Family history of malignant neoplasm of other urinary tract organ: Secondary | ICD-10-CM | POA: Diagnosis not present

## 2019-05-25 DIAGNOSIS — Z8249 Family history of ischemic heart disease and other diseases of the circulatory system: Secondary | ICD-10-CM | POA: Diagnosis not present

## 2019-05-25 DIAGNOSIS — D6869 Other thrombophilia: Secondary | ICD-10-CM

## 2019-05-25 NOTE — Progress Notes (Signed)
Primary Care Physician: Dettinger, Elige RadonJoshua A, MD Ref[facility] Phys[facility]u Cardiologist: Berta MinorKlein   Buren Vedia PereyraM Medina is a 48 y.o. male with a h/o paroxsymal afib that has recently been documented after having short bursts of palpitations for some time. It was  dx with a ER visit 07/20/16, showing afib, and pt required cardioversion x 2 in the ER. He was placed on metoprolol 12.5 mg bid. He was trying to wean off drug and had not had any BB the day of his next afib episode 2/21 and presented with afib with rvr at 170 bpm  In the late afternoon. No known trigger.Again required cardioversion. He was placed on 25 mg bid of toprol and again will require DAOC x 30 days s/p cardioversion with a CHA2DS2VASc score of 0.  In the afib office today, 05/13/17. He has not had an episode of afib for over 6 months. He asked to be seen for some f/u questions from visit with Dr. Graciela HusbandsKlein. He was c/o of mental dullness with metoprolol tartrate  and he was given 3 different BB's that he could try if he wished to see if he could tolerate one better than the other.  F/u in the afib clinic, 08/05/2017, he reports that he is doing well. Only one episode afib that he did not panic and go to the ER. " I just tried to stay calm and went to sleep and when I woke up it was gone." He has not tried all of the BB's that Dr. Graciela HusbandsKlein Rx'ed him but is getting good results with metoprolol for now.   F/u in afib clinic, 05/25/19. Pt wanted to touch base as he had an episode of irregular rhythm x 15-20 mins as he was brushing his teeth. He took a 30 mg Cardizem and it resolved shortly after. He has been taking 1/2 tab of cardizem 2-3 x a day since then as long with a 1/2 tab of metoprolol. He has not noted any further sustained episodes.  Today, he denies symptoms of palpitations, chest pain, shortness of breath, orthopnea, PND, lower extremity edema, dizziness, presyncope, syncope, or neurologic sequela. The patient is tolerating medications  without difficulties and is otherwise without complaint today.   Past Medical History:  Diagnosis Date   Anxiety    Atrial fibrillation (HCC)    Hemorrhoid thrombosis    Mitral valve prolapse    Unspecified hemorrhoids without mention of complication 2006   Venous reflux 01/02/12   deep right per Quita SkyeJames D. Hart RochesterLawson, M.D.   Past Surgical History:  Procedure Laterality Date   MOUTH SURGERY     wisdom teeth   neg hx      Current Outpatient Medications  Medication Sig Dispense Refill   Artificial Tear Solution (TEARS RENEWED OP) Place 1-2 drops into both eyes 3 (three) times daily as needed (for dry eyes).     diltiazem (CARDIZEM) 30 MG tablet Take 1 tablet every 4 hours AS NEEDED for AFIB heart rate >100 as long as blood pressure >100. 45 tablet 3   doxycycline (VIBRA-TABS) 100 MG tablet Take 1 tablet (100 mg total) by mouth 2 (two) times daily. 1 po bid 20 tablet 0   metoprolol tartrate (LOPRESSOR) 25 MG tablet Take 1/2 (one-half) tablet by mouth twice daily 90 tablet 0   pantoprazole (PROTONIX) 40 MG tablet Take 1 tablet (40 mg total) by mouth daily. For stomach 30 tablet 11   No current facility-administered medications for this encounter.  No Known Allergies  Social History   Socioeconomic History   Marital status: Single    Spouse name: Not on file   Number of children: 0   Years of education: Not on file   Highest education level: Not on file  Occupational History    Employer: [employer] resource strain: Not on file   Food insecurity    Worry: Not on file    Inability: Not on file   Transportation needs    Medical: Not on file    Non-medical: Not on file  Tobacco Use   Smoking status: Never Smoker   Smokeless tobacco: Never Used  Substance and Sexual Activity   Alcohol use: No   Drug use: No   Sexual activity: Not on file  Lifestyle   Physical activity    Days per week: Not on file     Minutes per session: Not on file   Stress: Not on file  Relationships   Social connections    Talks on phone: Not on file    Gets together: Not on file    Attends religious service: Not on file    Active member of club or organization: Not on file    Attends meetings of clubs or organizations: Not on file    Relationship status: Not on file   Intimate partner violence    Fear of current or ex partner: Not on file    Emotionally abused: Not on file    Physically abused: Not on file    Forced sexual activity: Not on file  Other Topics Concern   Not on file  Social History Narrative   Not on file    Family History  Problem Relation Age of Onset   Colon cancer Father 45   Rectal cancer Father    Hypertension Father    Stroke Mother    Colon cancer Maternal Grandfather 49   Rectal cancer Paternal Grandfather    Heart attack Paternal Uncle     ROS- All systems are reviewed and negative except as per the HPI above  Physical Exam: Vitals:   05/25/19 1339  BP: 110/76  Pulse: 80  Weight: 87.3 kg  Height: 6' 3.75" (1.924 m)   Wt Readings from Last 3 Encounters:  05/25/19 87.3 kg  02/22/19 88 kg  06/30/18 90.7 kg    Labs: Lab Results  Component Value Date   NA 141 01/18/2019   K 4.1 01/18/2019   CL 104 01/18/2019   CO2 21 01/18/2019   GLUCOSE 102 (H) 01/18/2019   BUN 12 01/18/2019   CREATININE 1.02 01/18/2019   CALCIUM 9.4 01/18/2019   MG 2.0 09/03/2016   No results found for: INR Lab Results  Component Value Date   CHOL 214 (H) 01/18/2019   HDL 57 01/18/2019   LDLCALC 138 (H) 01/18/2019   TRIG 95 01/18/2019     GEN- The patient is well appearing, alert and oriented x 3 today.   Head- normocephalic, atraumatic Eyes-  Sclera clear, conjunctiva pink Ears- hearing intact Oropharynx- clear Neck- supple, no JVP Lymph- no cervical lymphadenopathy Lungs- Clear to ausculation bilaterally, normal work of breathing Heart- Regular rate and rhythm,  no murmurs, rubs or gallops, PMI not laterally displaced GI- soft, NT, ND, + BS Extremities- no clubbing, cyanosis, or edema MS- no significant deformity or atrophy Skin- no rash or lesion Psych- euthymic mood, full affect Neuro- strength and sensation are intact  EKG-NSR  @  80 bpm, Pr int 168 ms, qrs int 92 ms, qtc 401 ms Echo-Study Conclusions  - Left ventricle: The cavity size was normal. There was mild   concentric hypertrophy. Systolic function was normal. The   estimated ejection fraction was in the range of 55% to 60%. Wall   motion was normal; there were no regional wall motion   abnormalities. Left ventricular diastolic function parameters   were normal. - Aortic valve: Transvalvular velocity was within the normal range.   There was no stenosis. There was no regurgitation. - Mitral valve: Thickening and elongation of the anterior leaflet.   Transvalvular velocity was within the normal range. There was no   evidence for stenosis. There was mild regurgitation. - Right ventricle: The cavity size was normal. Wall thickness was   normal. Systolic function was normal. - Tricuspid valve: There was trivial regurgitation. - Pulmonary arteries: Systolic pressure was within the normal   range.    Assessment and Plan: 1. Paroxsymal afib General education re afib and how to manage Has an anxious personality and is very afraid of having attacks, he has learned  to relax to treat afib instead of ER visit, has been 2 years since issues with afib Currently with very low afib burden 30 mg cardizem pill in pocket as needed, would not take daily  I would prefer 12.5 mg metoprolol bid instead of taking extra prn  daily cardizem He is currently taking metoprolol tartrate 12.5 mg  daily Does not need DOAC long term due to CHA2DS2VASc score of 0  afib clinic as needed  Lupita Leash [facility]. [facility] [address] [place], [place] [address] [phone]

## 2019-05-28 ENCOUNTER — Other Ambulatory Visit: Payer: Self-pay | Admitting: Family Medicine

## 2019-06-02 ENCOUNTER — Ambulatory Visit (INDEPENDENT_AMBULATORY_CARE_PROVIDER_SITE_OTHER): Payer: BC Managed Care – PPO | Admitting: Family Medicine

## 2019-06-02 ENCOUNTER — Encounter: Payer: Self-pay | Admitting: Family Medicine

## 2019-06-02 DIAGNOSIS — F410 Panic disorder [episodic paroxysmal anxiety] without agoraphobia: Secondary | ICD-10-CM

## 2019-06-02 MED ORDER — DIAZEPAM 2 MG PO TABS: 2.0000 mg | ORAL_TABLET | ORAL | 1 refills | Status: DC | PRN

## 2019-06-02 NOTE — Progress Notes (Signed)
Virtual Visit via telephone Note  I connected with Charles Rasmussen on 06/02/19 at 1435 by telephone and verified that I am speaking with the correct person using two identifiers. Charles Rasmussen is currently located at home and no other people are currently with her during visit. The provider, Fransisca Kaufmann , MD is located in their office at time of visit.  Call ended at 1449  I discussed the limitations, risks, security and privacy concerns of performing an evaluation and management service by telephone and the availability of in person appointments. I also discussed with the patient that there may be a patient responsible charge related to this service. The patient expressed understanding and agreed to proceed.   History and Present Illness: Patient is calling in with complaints about his afib.  He is taking metoprolol 12.5mg  and diltiazem 30 as needed.  Last weekend he noticed a person he was attracted to and had a lot more palpitations and it took four minutes to relieve even when he was bearing down. He feels like maybe related to adrenaline. He dd not have any chest pains or difficulty breathing.  He is wondering if he could have some valium just in case he gets these episodes or panic episodes.   No diagnosis found.  Outpatient Encounter Medications as of 06/02/2019  Medication Sig  . Artificial Tear Solution (TEARS RENEWED OP) Place 1-2 drops into both eyes 3 (three) times daily as needed (for dry eyes).  Marland Kitchen diltiazem (CARDIZEM) 30 MG tablet Take 1 tablet every 4 hours AS NEEDED for AFIB heart rate >100 as long as blood pressure >100.  Marland Kitchen doxycycline (VIBRA-TABS) 100 MG tablet Take 1 tablet (100 mg total) by mouth 2 (two) times daily. 1 po bid  . metoprolol tartrate (LOPRESSOR) 25 MG tablet Take 1/2 (one-half) tablet by mouth twice daily  . pantoprazole (PROTONIX) 40 MG tablet Take 1 tablet (40 mg total) by mouth daily. For stomach   No facility-administered encounter medications on  file as of 06/02/2019.     Review of Systems  Constitutional: Negative for chills and fever.  Eyes: Negative for visual disturbance.  Respiratory: Negative for shortness of breath and wheezing.   Cardiovascular: Positive for palpitations. Negative for chest pain and leg swelling.  Musculoskeletal: Negative for back pain and gait problem.  Skin: Negative for rash.  Neurological: Negative for dizziness, weakness and light-headedness.  Psychiatric/Behavioral: Negative for self-injury, sleep disturbance and suicidal ideas. The patient is nervous/anxious.   All other systems reviewed and are negative.   Observations/Objective: Patient sounds comfortable and in no acute distress  Assessment and Plan: Problem List Items Addressed This Visit    None    Visit Diagnoses    Panic attacks    -  Primary   Relevant Medications   diazepam (VALIUM) 2 MG tablet      He has been having a little more anxiety and panicky episodes, will give a little bit of Valium to help with these, he says his mother used to have some and he took it a couple times.  If he ends up starting to need it more than a couple times a week he will talk back with [place] and we will talk about prevention such as an SSRI Follow Up Instructions:  Follow up as needed   I discussed the assessment and treatment plan with the patient. The patient was provided an opportunity to ask questions and all were answered. The patient agreed with the plan and  demonstrated an understanding of the instructions.   The patient was advised to call back or seek an in-person evaluation if the symptoms worsen or if the condition fails to improve as anticipated.  The above assessment and management plan was discussed with the patient. The patient verbalized understanding of and has agreed to the management plan. Patient is aware to call the clinic if symptoms persist or worsen. Patient is aware when to return to the clinic for a follow-up visit. Patient  educated on when it is appropriate to go to the emergency department.    I provided 14 minutes of non-face-to-face time during this encounter.    Nils Pyle, MD

## 2019-07-27 ENCOUNTER — Ambulatory Visit (INDEPENDENT_AMBULATORY_CARE_PROVIDER_SITE_OTHER): Payer: BC Managed Care – PPO | Admitting: Family Medicine

## 2019-07-27 ENCOUNTER — Encounter: Payer: Self-pay | Admitting: Family Medicine

## 2019-07-27 DIAGNOSIS — R1032 Left lower quadrant pain: Secondary | ICD-10-CM | POA: Diagnosis not present

## 2019-07-27 DIAGNOSIS — M545 Low back pain, unspecified: Secondary | ICD-10-CM

## 2019-07-27 NOTE — Progress Notes (Signed)
Virtual Visit via telephone Note  I connected with Charles Rasmussen on 07/27/19 at 1357 by telephone and verified that I am speaking with the correct person using two identifiers. Charles Rasmussen is currently located at home and no other people are currently with her during visit. The provider, Fransisca Kaufmann Annesha Delgreco, MD is located in their office at time of visit.  Call ended at 1408  I discussed the limitations, risks, security and privacy concerns of performing an evaluation and management service by telephone and the availability of in person appointments. I also discussed with the patient that there may be a patient responsible charge related to this service. The patient expressed understanding and agreed to proceed.   History and Present Illness: Patient is calling in for f/u for abdominal pain and the urologist, patient has been doing PT for 4-5 visits and he is doing exercises to loosen up the hip and lower back.  He was last seen 6 weeks ago and the MRI was not covered. He does notice that when he urinates it has increased warmth. He does have a lower abdominal and back pressure. The pain is not improving.   1. Acute left-sided low back pain without sciatica   2. Left lower quadrant abdominal pain     Outpatient Encounter Medications as of 07/27/2019  Medication Sig  . Artificial Tear Solution (TEARS RENEWED OP) Place 1-2 drops into both eyes 3 (three) times daily as needed (for dry eyes).  . diazepam (VALIUM) 2 MG tablet Take 1 tablet (2 mg total) by mouth as needed for anxiety.  Marland Kitchen diltiazem (CARDIZEM) 30 MG tablet Take 1 tablet every 4 hours AS NEEDED for AFIB heart rate >100 as long as blood pressure >100.  . metoprolol tartrate (LOPRESSOR) 25 MG tablet Take 1/2 (one-half) tablet by mouth twice daily  . [DISCONTINUED] doxycycline (VIBRA-TABS) 100 MG tablet Take 1 tablet (100 mg total) by mouth 2 (two) times daily. 1 po bid  . [DISCONTINUED] pantoprazole (PROTONIX) 40 MG tablet Take 1  tablet (40 mg total) by mouth daily. For stomach   No facility-administered encounter medications on file as of 07/27/2019.    Review of Systems  Constitutional: Negative for chills and fever.  Eyes: Negative for discharge.  Respiratory: Negative for shortness of breath and wheezing.   Cardiovascular: Negative for chest pain and leg swelling.  Gastrointestinal: Positive for abdominal pain.  Musculoskeletal: Positive for arthralgias and back pain. Negative for gait problem.  Skin: Negative for rash.  All other systems reviewed and are negative.   Observations/Objective: Patient sounds comfortable and in no acute distress  Assessment and Plan: Problem List Items Addressed This Visit    None    Visit Diagnoses    Acute left-sided low back pain without sciatica    -  Primary   Relevant Orders   MR Lumbar Spine Wo Contrast   Left lower quadrant abdominal pain          Has done PT and anti-inflammatories and has been treated for possible prostatitis long-term but it may not be from the prostate but may be coming from his back.  I think he really needs an MRI. Follow up plan: Return if symptoms worsen or fail to improve.     I discussed the assessment and treatment plan with the patient. The patient was provided an opportunity to ask questions and all were answered. The patient agreed with the plan and demonstrated an understanding of the instructions.   The  patient was advised to call back or seek an in-person evaluation if the symptoms worsen or if the condition fails to improve as anticipated.  The above assessment and management plan was discussed with the patient. The patient verbalized understanding of and has agreed to the management plan. Patient is aware to call the clinic if symptoms persist or worsen. Patient is aware when to return to the clinic for a follow-up visit. Patient educated on when it is appropriate to go to the emergency department.    I provided 11 minutes  of non-face-to-face time during this encounter.    Nils Pyle, MD

## 2019-08-02 ENCOUNTER — Ambulatory Visit (INDEPENDENT_AMBULATORY_CARE_PROVIDER_SITE_OTHER): Payer: BC Managed Care – PPO | Admitting: Licensed Clinical Social Worker

## 2019-08-02 DIAGNOSIS — F419 Anxiety disorder, unspecified: Secondary | ICD-10-CM | POA: Diagnosis not present

## 2019-08-08 ENCOUNTER — Other Ambulatory Visit: Payer: Self-pay | Admitting: Family Medicine

## 2019-08-08 DIAGNOSIS — M25552 Pain in left hip: Secondary | ICD-10-CM

## 2019-08-08 DIAGNOSIS — M545 Low back pain, unspecified: Secondary | ICD-10-CM

## 2019-08-08 NOTE — Progress Notes (Unsigned)
Please refer patient

## 2019-08-12 ENCOUNTER — Other Ambulatory Visit: Payer: Self-pay | Admitting: Family Medicine

## 2019-08-12 ENCOUNTER — Encounter: Payer: Self-pay | Admitting: Family Medicine

## 2019-08-15 MED ORDER — METOPROLOL TARTRATE 25 MG PO TABS: ORAL_TABLET | ORAL | 0 refills | Status: DC

## 2019-08-26 DIAGNOSIS — I10 Essential (primary) hypertension: Secondary | ICD-10-CM | POA: Insufficient documentation

## 2019-08-26 DIAGNOSIS — I519 Heart disease, unspecified: Secondary | ICD-10-CM | POA: Insufficient documentation

## 2019-09-14 ENCOUNTER — Ambulatory Visit (HOSPITAL_COMMUNITY): Payer: BC Managed Care – PPO | Admitting: Nurse Practitioner

## 2019-09-15 ENCOUNTER — Other Ambulatory Visit: Payer: Self-pay

## 2019-09-15 ENCOUNTER — Encounter (HOSPITAL_COMMUNITY): Payer: Self-pay | Admitting: Nurse Practitioner

## 2019-09-15 ENCOUNTER — Ambulatory Visit (HOSPITAL_COMMUNITY)
Admission: RE | Admit: 2019-09-15 | Discharge: 2019-09-15 | Disposition: A | Discharge status: Home / Self Care | Payer: BC Managed Care – PPO | Source: Ambulatory Visit | Attending: Nurse Practitioner | Admitting: Nurse Practitioner

## 2019-09-15 VITALS — BP 122/90 | HR 78 | Ht 75.75 in | Wt 196.0 lb

## 2019-09-15 DIAGNOSIS — F419 Anxiety disorder, unspecified: Secondary | ICD-10-CM | POA: Diagnosis not present

## 2019-09-15 DIAGNOSIS — I480 Paroxysmal atrial fibrillation: Secondary | ICD-10-CM | POA: Diagnosis not present

## 2019-09-15 NOTE — Progress Notes (Signed)
Primary Care Physician: Dettinger, Elige Radon, MD Referring Physician: [facility] ER f/u Cardiologist: Berta Minor Charles Rasmussen is a 49 y.o. male with a h/o paroxsymal afib that has recently been documented after having short bursts of palpitations for some time. It was  dx with a ER visit 07/20/16, showing afib, and pt required cardioversion x 2 in the ER. He was placed on metoprolol 12.5 mg bid. He was trying to wean off drug and had not had any BB the day of his next afib episode 2/21 and presented with afib with rvr at 170 bpm  In the late afternoon. No known trigger.Again required cardioversion. He was placed on 25 mg bid of toprol and again will require DAOC x 30 days s/p cardioversion with a CHA2DS2VASc score of 0.  In the afib office today, 05/13/17. He has not had an episode of afib for over 6 months. He asked to be seen for some f/u questions from visit with Dr. Graciela Husbands. He was c/o of mental dullness with metoprolol tartrate  and he was given 3 different BB's that he could try if he wished to see if he could tolerate one better than the other.  F/u in the afib clinic, 08/05/2017, he reports that he is doing well. Only one episode afib that he did not panic and go to the ER. " I just tried to stay calm and went to sleep and when I woke up it was gone." He has not tried all of the BB's that Dr. Graciela Husbands Rx'ed him but is getting good results with metoprolol for now.   F/u in afib clinic, 05/25/19. Pt wanted to touch base as he had an episode of irregular rhythm x 15-20 mins as he was brushing his teeth. He took a 30 mg Cardizem and it resolved shortly after. He has been taking 1/2 tab of cardizem 2-3 x a day since then as long with a 1/2 tab of metoprolol. He has not noted any further sustained episodes.  F/u 09/15/19, pt in for discussion if he wanted to change meds to include antiarrythmic if he felt his afib was escalating. We discussed Multaq may be a good option if he needed that. He currently is happy with  metoprolol for now. Other than a few palpitations, ha has not noted any sustained afib.   Today, he denies symptoms of palpitations, chest pain, shortness of breath, orthopnea, PND, lower extremity edema, dizziness, presyncope, syncope, or neurologic sequela. The patient is tolerating medications without difficulties and is otherwise without complaint today.   Past Medical History:  Diagnosis Date  . Anxiety   . Atrial fibrillation (HCC)   . Hemorrhoid thrombosis   . Mitral valve prolapse   . Unspecified hemorrhoids without mention of complication 2006  . Venous reflux 01/02/12   deep right per [name]. Hart Rochester, M.D.   Past Surgical History:  Procedure Laterality Date  . MOUTH SURGERY     wisdom teeth  . neg hx      Current Outpatient Medications  Medication Sig Dispense Refill  . Artificial Tear Solution (TEARS RENEWED OP) Place 1-2 drops into both eyes 3 (three) times daily as needed (for dry eyes).    . diazepam (VALIUM) 2 MG tablet Take 1 tablet (2 mg total) by mouth as needed for anxiety. 10 tablet 1  . diltiazem (CARDIZEM) 30 MG tablet Take 1 tablet every 4 hours AS NEEDED for AFIB heart rate >100 as long as blood pressure >100. 45 tablet  3  . metoprolol tartrate (LOPRESSOR) 25 MG tablet Take 1/2 (one-half) tablet by mouth twice daily NEEDS TO BE SEEN FOR FURTHER REFILLS 90 tablet 0   No current facility-administered medications for this encounter.    No Known Allergies  Social History   Socioeconomic History  . Marital status: Single    Spouse name: Not on file  . Number of children: 0  . Years of education: Not on file  . Highest education level: Not on file  Occupational History    Employer: [employer]  Tobacco Use  . Smoking status: Never Smoker  . Smokeless tobacco: Never Used  Substance and Sexual Activity  . Alcohol use: No  . Drug use: No  . Sexual activity: Not on file  Other Topics Concern  . Not on file  Social History Narrative  .  Not on file   Social Determinants of Health   Financial Resource Strain:   . Difficulty of Paying Living Expenses: Not on file  Food Insecurity:   . Worried About Charity fundraiser in the Last Year: Not on file  . Ran Out of Food in the Last Year: Not on file  Transportation Needs:   . Lack of Transportation (Medical): Not on file  . Lack of Transportation (Non-Medical): Not on file  Physical Activity:   . Days of Exercise per Week: Not on file  . Minutes of Exercise per Session: Not on file  Stress:   . Feeling of Stress : Not on file  Social Connections:   . Frequency of Communication with Friends and Family: Not on file  . Frequency of Social Gatherings with Friends and Family: Not on file  . Attends Religious Services: Not on file  . Active Member of Clubs or Organizations: Not on file  . Attends Archivist Meetings: Not on file  . Marital Status: Not on file  Intimate Partner Violence:   . Fear of Current or Ex-Partner: Not on file  . Emotionally Abused: Not on file  . Physically Abused: Not on file  . Sexually Abused: Not on file    Family History  Problem Relation Age of Onset  . Colon cancer Father 50  . Rectal cancer Father   . Hypertension Father   . Stroke Mother   . Colon cancer Maternal Grandfather 14  . Rectal cancer Paternal Grandfather   . Heart attack Paternal Uncle     ROS- All systems are reviewed and negative except as per the HPI above  Physical Exam: Vitals:   09/15/19 1534  BP: 122/90  Pulse: 78  SpO2: 98%  Weight: 88.9 kg  Height: 6' 3.75" (1.924 m)   Wt Readings from Last 3 Encounters:  09/15/19 88.9 kg  05/25/19 87.3 kg  02/22/19 88 kg    Labs: Lab Results  Component Value Date   NA 141 01/18/2019   K 4.1 01/18/2019   CL 104 01/18/2019   CO2 21 01/18/2019   GLUCOSE 102 (H) 01/18/2019   BUN 12 01/18/2019   CREATININE 1.02 01/18/2019   CALCIUM 9.4 01/18/2019   MG 2.0 09/03/2016   No results found for: INR Lab  Results  Component Value Date   CHOL 214 (H) 01/18/2019   HDL 57 01/18/2019   LDLCALC 138 (H) 01/18/2019   TRIG 95 01/18/2019     GEN- The patient is well appearing, alert and oriented x 3 today.   Head- normocephalic, atraumatic Eyes-  Sclera clear, conjunctiva pink Ears-  hearing intact Oropharynx- clear Neck- supple, no JVP Lymph- no cervical lymphadenopathy Lungs- Clear to ausculation bilaterally, normal work of breathing Heart- Regular rate and rhythm, no murmurs, rubs or gallops, PMI not laterally displaced GI- soft, NT, ND, + BS Extremities- no clubbing, cyanosis, or edema MS- no significant deformity or atrophy Skin- no rash or lesion Psych- euthymic mood, full affect Neuro- strength and sensation are intact  EKG-not done today Echo-Study Conclusions  - Left ventricle: The cavity size was normal. There was mild   concentric hypertrophy. Systolic function was normal. The   estimated ejection fraction was in the range of 55% to 60%. Wall   motion was normal; there were no regional wall motion   abnormalities. Left ventricular diastolic function parameters   were normal. - Aortic valve: Transvalvular velocity was within the normal range.   There was no stenosis. There was no regurgitation. - Mitral valve: Thickening and elongation of the anterior leaflet.   Transvalvular velocity was within the normal range. There was no   evidence for stenosis. There was mild regurgitation. - Right ventricle: The cavity size was normal. Wall thickness was   normal. Systolic function was normal. - Tricuspid valve: There was trivial regurgitation. - Pulmonary arteries: Systolic pressure was within the normal   range.    Assessment and Plan: 1. Paroxsymal afib General education re afib and how to manage He is very afraid of having attacks, he has learned  to relax to treat afib instead of ER visit, has been 2 years since issues with afib Currently with very low afib burden 30 mg  cardizem pill in pocket as needed  Continue 12.5 mg metoprolol tartrate  bid   If antiarrythmic is needed, discussed Multaq may be a good option Does not need DOAC long term due to CHA2DS2VASc score of 0  afib clinic as needed  Lupita Leash C. [facility] [address] [place], [place] [address] [phone]

## 2019-09-18 ENCOUNTER — Ambulatory Visit: Payer: BC Managed Care – PPO | Attending: Internal Medicine

## 2019-09-18 DIAGNOSIS — Z23 Encounter for immunization: Secondary | ICD-10-CM | POA: Insufficient documentation

## 2019-09-18 NOTE — Progress Notes (Signed)
   Covid-19 Vaccination Clinic  Name:  Charles Rasmussen    MRN: [id number] DOB: 1970-11-04  09/18/2019  [name] was observed post Covid-19 immunization for 15 minutes without incident. He was provided with Vaccine Information Sheet and instruction to access the V-Safe system.   [name] was instructed to call 911 with any severe reactions post vaccine: Marland Kitchen Difficulty breathing  . Swelling of face and throat  . A fast heartbeat  . A bad rash all over body  . Dizziness and weakness   Immunizations Administered    Name Date Dose VIS Date Route   Pfizer COVID-19 Vaccine 09/18/2019  5:11 PM 0.3 mL 06/24/2019 Intramuscular   Manufacturer: [employer], [employer]   Lot: [id number]   NDC: [address]-5

## 2019-10-08 DIAGNOSIS — I4891 Unspecified atrial fibrillation: Secondary | ICD-10-CM | POA: Insufficient documentation

## 2019-10-09 ENCOUNTER — Other Ambulatory Visit: Payer: Self-pay

## 2019-10-09 ENCOUNTER — Ambulatory Visit: Payer: BC Managed Care – PPO | Attending: Internal Medicine

## 2019-10-09 DIAGNOSIS — Z23 Encounter for immunization: Secondary | ICD-10-CM

## 2019-10-09 NOTE — Progress Notes (Signed)
   Covid-19 Vaccination Clinic  Name:  Charles Rasmussen    MRN: [id number] DOB: 1971-03-17  10/09/2019  Mr. Charles Rasmussen was observed post Covid-19 immunization for 15 minutes without incident. He was provided with Vaccine Information Sheet and instruction to access the V-Safe system.   Mr. Charles Rasmussen was instructed to call 911 with any severe reactions post vaccine: Marland Kitchen Difficulty breathing  . Swelling of face and throat  . A fast heartbeat  . A bad rash all over body  . Dizziness and weakness   Immunizations Administered    Name Date Dose VIS Date Route   Pfizer COVID-19 Vaccine 10/09/2019  3:04 PM 0.3 mL 06/24/2019 Intramuscular   Manufacturer: [employer], [employer]   Lot: [id number]   NDC: [address]-2

## 2019-10-10 ENCOUNTER — Ambulatory Visit: Payer: BC Managed Care – PPO | Admitting: Internal Medicine

## 2019-10-10 ENCOUNTER — Encounter: Payer: Self-pay | Admitting: Internal Medicine

## 2019-10-10 VITALS — BP 116/70 | HR 87 | Ht 76.0 in | Wt 198.0 lb

## 2019-10-10 DIAGNOSIS — I493 Ventricular premature depolarization: Secondary | ICD-10-CM

## 2019-10-10 DIAGNOSIS — I480 Paroxysmal atrial fibrillation: Secondary | ICD-10-CM

## 2019-10-10 DIAGNOSIS — R002 Palpitations: Secondary | ICD-10-CM

## 2019-10-10 NOTE — Patient Instructions (Signed)
Medication Instructions:  Your physician recommends that you continue on your current medications as directed. Please refer to the Current Medication list given to you today.   *If you need a refill on your cardiac medications before your next appointment, please call your pharmacy*   Lab Work: None ordered.  If you have labs (blood work) drawn today and your tests are completely normal, you will receive your results only by: Marland Kitchen MyChart Message (if you have MyChart) OR . A paper copy in the mail If you have any lab test that is abnormal or we need to change your treatment, we will call you to review the results.   Testing/Procedures: None ordered.    Follow-Up: At [facility], you and your health needs are our priority.  As part of our continuing mission to provide you with exceptional heart care, we have created designated Provider Care Teams.  These Care Teams include your primary Cardiologist (physician) and Advanced Practice Providers (APPs -  Physician Assistants and Nurse Practitioners) who all work together to provide you with the care you need, when you need it.  We recommend signing up for the patient portal called "MyChart".  Sign up information is provided on this After Visit Summary.  MyChart is used to connect with patients for Virtual Visits (Telemedicine).  Patients are able to view lab/test results, encounter notes, upcoming appointments, etc.  Non-urgent messages can be sent to your provider as well.   To learn more about what you can do with MyChart, go to [email or web address].    Your next appointment:   12 months with Dr Graciela Husbands  The format for your next appointment:   In person  Provider:   You may see Dr Graciela Husbands or one of the following Advanced Practice Providers on your designated Care Team:    Gypsy Balsam, NP  Francis Dowse, PA-C  Casimiro Needle "[facility]" [place], [place]

## 2019-10-10 NOTE — Progress Notes (Signed)
      Patient Care Team: Dettinger, Elige Radon, MD as PCP - General (Family Medicine) Laurey Morale, MD (Cardiology) Arminda Resides, MD (Dermatology) Olena Leatherwood., MD (Ophthalmology)   HPI  Charles Rasmussen is a 49 y.o. male Seen in follow-up for palpitations which include and surprisingly PVCs atrial fibrillation requiring cardioversion most recently 09/03/16 ( AFIB CLINIC reports 2/21)     Thinks he is not as sharp on beta blockers even his very low dose-- he had some recurrent palpitations as he considered again dating a girl  He reminds me that his mom died of stroke x2 2022-12-07;  He shares a 30 acre spread with his brother and father   Anxiety remains a major issue per previous notes-- been back at school.  Is vaccinated  Thromboembolic risk factors ( neg) for a CHADSVASc Score of 0   Past Medical History:  Diagnosis Date  . Anxiety   . Atrial fibrillation (HCC)   . Hemorrhoid thrombosis   . Mitral valve prolapse   . Unspecified hemorrhoids without mention of complication 2006  . Venous reflux 01/02/12   deep right per [name]. Hart Rochester, M.D.    Past Surgical History:  Procedure Laterality Date  . MOUTH SURGERY     wisdom teeth  . neg hx      Current Outpatient Medications  Medication Sig Dispense Refill  . Artificial Tear Solution (TEARS RENEWED OP) Place 1-2 drops into both eyes 3 (three) times daily as needed (for dry eyes).    . diazepam (VALIUM) 2 MG tablet Take 1 tablet (2 mg total) by mouth as needed for anxiety. 10 tablet 1  . diltiazem (CARDIZEM) 30 MG tablet Take 1 tablet every 4 hours AS NEEDED for AFIB heart rate >100 as long as blood pressure >100. 45 tablet 3  . metoprolol tartrate (LOPRESSOR) 25 MG tablet Take 1/2 (one-half) tablet by mouth twice daily NEEDS TO BE SEEN FOR FURTHER REFILLS 90 tablet 0   No current facility-administered medications for this visit.    No Known Allergies    Review of Systems negative except from HPI and  PMH  Physical Exam BP 116/70   Pulse 87   Ht 6\' 4"  (1.93 m)   Wt 198 lb (89.8 kg)   SpO2 99%   BMI 24.10 kg/m  Well developed and nourished in no acute distress HENT normal Neck supple with JVP-  Flat  Clear Regular rate and rhythm, no murmurs or gallops Abd-soft with active BS No Clubbing cyanosis edema Skin-warm and dry A & Oriented  Grossly normal sensory and motor function  ECG  Sinus @ 87 17/09/33 IVC LBBB/Inf axis  Assessment and  Plan  Palpitations-PVCs  Side effects ? BB  Atrial Fib paroxysmal  Anxiety    Suggested taking increase BB ( 25 bid) when he is particularly anxious  Some recurrent palps but no sustained arrhythmia  Not sure if his recent symptoms were atrial or ventricular

## 2019-11-04 ENCOUNTER — Other Ambulatory Visit (HOSPITAL_COMMUNITY): Payer: Self-pay | Admitting: *Deleted

## 2019-11-04 MED ORDER — METOPROLOL TARTRATE 25 MG PO TABS: ORAL_TABLET | ORAL | 2 refills | Status: DC

## 2020-01-06 ENCOUNTER — Encounter: Payer: BC Managed Care – PPO | Admitting: Physician Assistant

## 2020-01-06 ENCOUNTER — Ambulatory Visit (INDEPENDENT_AMBULATORY_CARE_PROVIDER_SITE_OTHER): Payer: BC Managed Care – PPO | Admitting: Physician Assistant

## 2020-01-06 ENCOUNTER — Encounter: Payer: Self-pay | Admitting: Physician Assistant

## 2020-01-06 ENCOUNTER — Other Ambulatory Visit: Payer: Self-pay

## 2020-01-06 VITALS — BP 112/78 | HR 73 | Temp 97.8°F | Ht 76.0 in | Wt 194.6 lb

## 2020-01-06 DIAGNOSIS — Z0000 Encounter for general adult medical examination without abnormal findings: Secondary | ICD-10-CM | POA: Diagnosis not present

## 2020-01-06 NOTE — Progress Notes (Signed)
Subjective:     Patient ID: Charles Rasmussen, male   DOB: 1971/04/22, 49 y.o.   MRN: [id number]  HPI Pt here for PE Sig PMH of Afib, PVC, and intermit anxiety Currently has regular f/u with Cardiology Charles Rasmussen will feel occ PVC but no sx of Afib Tolerating meds well except for occasional constipation Exercises by using hiking trails and intermit light weight work  Review of Systems  Constitutional: Negative.   HENT: Negative.   Respiratory: Negative.   Cardiovascular: Negative.   Gastrointestinal: Positive for constipation. Negative for abdominal distention, abdominal pain, anal bleeding, blood in stool, diarrhea, nausea, rectal pain and vomiting.  Genitourinary: Negative.   Musculoskeletal: Negative.   Skin: Negative.   Neurological: Negative.   Psychiatric/Behavioral: Negative.        Objective:   Physical Exam Vitals and nursing note reviewed.  Constitutional:      General: Charles Rasmussen is not in acute distress.    Appearance: Normal appearance. Charles Rasmussen is normal weight. Charles Rasmussen is not ill-appearing or toxic-appearing.  HENT:     Head: Normocephalic.     Right Ear: Tympanic membrane, ear canal and external ear normal.     Left Ear: Tympanic membrane, ear canal and external ear normal.     Nose: Nose normal.     Mouth/Throat:     Mouth: Mucous membranes are moist.     Pharynx: Oropharynx is clear. No oropharyngeal exudate or posterior oropharyngeal erythema.  Eyes:     Extraocular Movements: Extraocular movements intact.     Conjunctiva/sclera: Conjunctivae normal.     Pupils: Pupils are equal, round, and reactive to light.  Neck:     Vascular: No carotid bruit.  Cardiovascular:     Rate and Rhythm: Normal rate and regular rhythm.     Pulses: Normal pulses.     Heart sounds: Normal heart sounds.     Comments: Nl rhythm in the office today Pulmonary:     Effort: Pulmonary effort is normal.     Breath sounds: Normal breath sounds.  Abdominal:     General: Abdomen is flat. There is no  distension.     Palpations: Abdomen is soft. There is no mass.     Tenderness: There is no abdominal tenderness. There is no right CVA tenderness, left CVA tenderness, guarding or rebound.  Musculoskeletal:        General: No swelling, tenderness or deformity. Normal range of motion.     Cervical back: Normal range of motion and neck supple.     Right lower leg: No edema.     Left lower leg: No edema.  Lymphadenopathy:     Cervical: No cervical adenopathy.  Skin:    General: Skin is warm and dry.  Neurological:     General: No focal deficit present.     Mental Status: Charles Rasmussen is alert and oriented to person, place, and time.     Cranial Nerves: No cranial nerve deficit.     Sensory: No sensory deficit.     Motor: No weakness.     Coordination: Coordination normal.     Gait: Gait normal.     Deep Tendon Reflexes: Reflexes normal.  Psychiatric:        Mood and Affect: Mood normal.        Behavior: Behavior normal.        Thought Content: Thought content normal.        Judgment: Judgment normal.        Assessment:  1. Physical exam        Plan:    Discussed pro and cons of PSA test Pt would prefer PSA done CMP and lipid pending and will inform of results Keep regular yearly check with Cardiologist Activities as tol Good diet/exercise reviewed Pt with a sig FH of rectal ca in father and grandfather so colonoscopy referral done F/U pending labs *

## 2020-01-06 NOTE — Patient Instructions (Signed)
Exercising to Stay Healthy To become healthy and stay healthy, it is recommended that you do moderate-intensity and vigorous-intensity exercise. You can tell that you are exercising at a moderate intensity if your heart starts beating faster and you start breathing faster but can still hold a conversation. You can tell that you are exercising at a vigorous intensity if you are breathing much harder and faster and cannot hold a conversation while exercising. Exercising regularly is important. It has many health benefits, such as:  Improving overall fitness, flexibility, and endurance.  Increasing bone density.  Helping with weight control.  Decreasing body fat.  Increasing muscle strength.  Reducing stress and tension.  Improving overall health. How often should I exercise? Choose an activity that you enjoy, and set realistic goals. Your health care provider can help you make an activity plan that works for you. Exercise regularly as told by your health care provider. This may include:  Doing strength training two times a week, such as: ? Lifting weights. ? Using resistance bands. ? Push-ups. ? Sit-ups. ? Yoga.  Doing a certain intensity of exercise for a given amount of time. Choose from these options: ? A total of 150 minutes of moderate-intensity exercise every week. ? A total of 75 minutes of vigorous-intensity exercise every week. ? A mix of moderate-intensity and vigorous-intensity exercise every week. Children, pregnant women, people who have not exercised regularly, people who are overweight, and older adults may need to talk with a health care provider about what activities are safe to do. If you have a medical condition, be sure to talk with your health care provider before you start a new exercise program. What are some exercise ideas? Moderate-intensity exercise ideas include:  Walking 1 mile (1.6 km) in about 15  minutes.  Biking.  Hiking.  Golfing.  Dancing.  Water aerobics. Vigorous-intensity exercise ideas include:  Walking 4.5 miles (7.2 km) or more in about 1 hour.  Jogging or running 5 miles (8 km) in about 1 hour.  Biking 10 miles (16.1 km) or more in about 1 hour.  Lap swimming.  Roller-skating or in-line skating.  Cross-country skiing.  Vigorous competitive sports, such as football, basketball, and soccer.  Jumping rope.  Aerobic dancing. What are some everyday activities that can help me to get exercise?  Yard work, such as: ? Pushing a lawn mower. ? Raking and bagging leaves.  Washing your car.  Pushing a stroller.  Shoveling snow.  Gardening.  Washing windows or floors. How can I be more active in my day-to-day activities?  Use stairs instead of an elevator.  Take a walk during your lunch break.  If you drive, park your car farther away from your work or school.  If you take public transportation, get off one stop early and walk the rest of the way.  Stand up or walk around during all of your indoor phone calls.  Get up, stretch, and walk around every 30 minutes throughout the day.  Enjoy exercise with a friend. Support to continue exercising will help you keep a regular routine of activity. What guidelines can I follow while exercising?  Before you start a new exercise program, talk with your health care provider.  Do not exercise so much that you hurt yourself, feel dizzy, or get very short of breath.  Wear comfortable clothes and wear shoes with good support.  Drink plenty of water while you exercise to prevent dehydration or heat stroke.  Work out until your breathing   and your heartbeat get faster. Where to find more information  U.S. Department of Health and Human Services: www.hhs.gov  Centers for Disease Control and Prevention (CDC): www.cdc.gov Summary  Exercising regularly is important. It will improve your overall fitness,  flexibility, and endurance.  Regular exercise also will improve your overall health. It can help you control your weight, reduce stress, and improve your bone density.  Do not exercise so much that you hurt yourself, feel dizzy, or get very short of breath.  Before you start a new exercise program, talk with your health care provider. This information is not intended to replace advice given to you by your health care provider. Make sure you discuss any questions you have with your health care provider. Document Revised: 06/12/2017 Document Reviewed: 05/21/2017 Elsevier Patient Education  2020 Elsevier Inc.  

## 2020-01-07 LAB — CMP14+EGFR
ALT: 20 IU/L (ref 0–44)
AST: 23 IU/L (ref 0–40)
Albumin/Globulin Ratio: 1.5 (ref 1.2–2.2)
Albumin: 4.5 g/dL (ref 4.0–5.0)
Alkaline Phosphatase: 77 IU/L (ref 48–121)
BUN/Creatinine Ratio: 16 (ref 9–20)
BUN: 17 mg/dL (ref 6–24)
Bilirubin Total: 0.7 mg/dL (ref 0.0–1.2)
CO2: 24 mmol/L (ref 20–29)
Calcium: 10 mg/dL (ref 8.7–10.2)
Chloride: 100 mmol/L (ref 96–106)
Creatinine, Ser: 1.08 mg/dL (ref 0.76–1.27)
GFR calc Af Amer: 92 mL/min/{1.73_m2} (ref >59)
GFR calc non Af Amer: 80 mL/min/{1.73_m2} (ref >59)
Globulin, Total: 3 g/dL (ref 1.5–4.5)
Glucose: 93 mg/dL (ref 65–99)
Potassium: 4.4 mmol/L (ref 3.5–5.2)
Sodium: 137 mmol/L (ref 134–144)
Total Protein: 7.5 g/dL (ref 6.0–8.5)

## 2020-01-07 LAB — LIPID PANEL
Chol/HDL Ratio: 4.1 ratio (ref 0.0–5.0)
Cholesterol, Total: 228 mg/dL — ABNORMAL HIGH (ref 100–199)
HDL: 56 mg/dL (ref >39)
LDL Chol Calc (NIH): 152 mg/dL — ABNORMAL HIGH (ref 0–99)
Triglycerides: 114 mg/dL (ref 0–149)
VLDL Cholesterol Cal: 20 mg/dL (ref 5–40)

## 2020-01-07 LAB — PSA: Prostate Specific Ag, Serum: 2.3 ng/mL (ref 0.0–4.0)

## 2020-04-12 ENCOUNTER — Telehealth: Payer: Self-pay

## 2020-04-13 NOTE — Telephone Encounter (Signed)
Appointment scheduled with Dr. Darlyn Read, patient aware.

## 2020-04-16 ENCOUNTER — Other Ambulatory Visit: Payer: Self-pay

## 2020-04-16 ENCOUNTER — Ambulatory Visit (INDEPENDENT_AMBULATORY_CARE_PROVIDER_SITE_OTHER): Payer: BC Managed Care – PPO | Admitting: Family

## 2020-04-16 ENCOUNTER — Encounter: Payer: Self-pay | Admitting: Family

## 2020-04-16 VITALS — BP 122/67 | HR 84 | Temp 97.8°F | Ht 76.0 in | Wt 194.8 lb

## 2020-04-16 DIAGNOSIS — R1903 Right lower quadrant abdominal swelling, mass and lump: Secondary | ICD-10-CM

## 2020-04-16 DIAGNOSIS — Z23 Encounter for immunization: Secondary | ICD-10-CM | POA: Diagnosis not present

## 2020-04-16 NOTE — Patient Instructions (Signed)
Lipoma  A lipoma is a noncancerous (benign) tumor that is made up of fat cells. This is a very common type of soft-tissue growth. Lipomas are usually found under the skin (subcutaneous). They may occur in any tissue of the body that contains fat. Common areas for lipomas to appear include the back, arms, shoulders, buttocks, and thighs. Lipomas grow slowly, and they are usually painless. Most lipomas do not cause problems and do not require treatment. What are the causes? The cause of this condition is not known. What increases the risk? You are more likely to develop this condition if:  You are 40-60 years old.  You have a family history of lipomas. What are the signs or symptoms? A lipoma usually appears as a small, round bump under the skin. In most cases, the lump will:  Feel soft or rubbery.  Not cause pain or other symptoms. However, if a lipoma is located in an area where it pushes on nerves, it can become painful or cause other symptoms. How is this diagnosed? A lipoma can usually be diagnosed with a physical exam. You may also have tests to confirm the diagnosis and to rule out other conditions. Tests may include:  Imaging tests, such as a CT scan or an MRI.  Removal of a tissue sample to be looked at under a microscope (biopsy). How is this treated? Treatment for this condition depends on the size of the lipoma and whether it is causing any symptoms.  For small lipomas that are not causing problems, no treatment is needed.  If a lipoma is bigger or it causes problems, surgery may be done to remove the lipoma. Lipomas can also be removed to improve appearance. Most often, the procedure is done after applying a medicine that numbs the area (local anesthetic).  Liposuction may be done to reduce the size of the lipoma before it is removed through surgery, or it may be done to remove the lipoma. Lipomas are removed with this method in order to limit incision size and scarring. A  liposuction tube is inserted through a small incision into the lipoma, and the contents of the lipoma are removed through the tube with suction. Follow these instructions at home:  Watch your lipoma for any changes.  Keep all follow-up visits as told by your health care provider. This is important. Contact a health care provider if:  Your lipoma becomes larger or hard.  Your lipoma becomes painful, red, or increasingly swollen. These could be signs of infection or a more serious condition. Get help right away if:  You develop tingling or numbness in an area near the lipoma. This could indicate that the lipoma is causing nerve damage. Summary  A lipoma is a noncancerous tumor that is made up of fat cells.  Most lipomas do not cause problems and do not require treatment.  If a lipoma is bigger or it causes problems, surgery may be done to remove the lipoma.  Contact a health care provider if your lipoma becomes larger or hard, or if it becomes painful, red, or increasingly swollen. Pain, redness, and swelling could be signs of infection or a more serious condition. This information is not intended to replace advice given to you by your health care provider. Make sure you discuss any questions you have with your health care provider. Document Revised: 02/14/2019 Document Reviewed: 02/14/2019 Elsevier Patient Education  2020 Elsevier Inc.  

## 2020-04-16 NOTE — Progress Notes (Signed)
   Subjective:    Patient ID: Charles Rasmussen, male    DOB: 1970/10/17, 49 y.o.   MRN: [id number]  Chief Complaint  Patient presents with  . Mass    on right side been there for a year, not sore     HPI Pt presents to the office today with nodule that he felt right lower abdomen area that he noticed about a year ago. He reports this is unchanged and denies any pain. He got a massage last week and was told to follow up  On this. He reports it is about a nickel size that is circular and is moveable.    Review of Systems  All other systems reviewed and are negative.      Objective:   Physical Exam Constitutional:      Appearance: Normal appearance.  HENT:     Mouth/Throat:     Mouth: Mucous membranes are moist.  Eyes:     Pupils: Pupils are equal, round, and reactive to light.  Cardiovascular:     Rate and Rhythm: Normal rate and regular rhythm.     Pulses: Normal pulses.     Heart sounds: Normal heart sounds.  Pulmonary:     Effort: Pulmonary effort is normal.  Abdominal:     General: Abdomen is flat. Bowel sounds are normal. There is no distension.     Palpations: Abdomen is soft. There is no mass (no mass flet on exam, patient unable to locate today, but states he felt it yesterday).     Tenderness: There is no abdominal tenderness.  Musculoskeletal:        General: Normal range of motion.     Cervical back: Normal range of motion.  Skin:    General: Skin is warm and dry.  Neurological:     General: No focal deficit present.     Mental Status: He is alert and oriented to person, place, and time.  Psychiatric:        Mood and Affect: Mood is anxious.        Behavior: Behavior normal.          BP 122/67   Pulse 84   Temp 97.8 F (36.6 C) (Temporal)   Ht 6\' 4"  (1.93 m)   Wt 194 lb 12.8 oz (88.4 kg)   BMI 23.71 kg/m   Assessment & Plan:  Charles Rasmussen comes in today with chief complaint of Mass (on right side been there for a year, not sore )   Diagnosis and  orders addressed:  1. Need for immunization against influenza - Flu Vaccine QUAD 36+ mos IM  2. Right lower quadrant abdominal mass - Vedia Pereyra Abdomen Limited; Future  Given we can't find on exam, was it a cyst and massage ruptured it?  Will order [place], I do not think this is cancerous. Probable lipoma able to find it.   [place], FNP

## 2020-04-19 ENCOUNTER — Ambulatory Visit (INDEPENDENT_AMBULATORY_CARE_PROVIDER_SITE_OTHER): Payer: BC Managed Care – PPO | Admitting: Internal Medicine

## 2020-04-19 ENCOUNTER — Encounter: Payer: Self-pay | Admitting: Internal Medicine

## 2020-04-19 VITALS — BP 102/70 | HR 80 | Ht 76.0 in | Wt 194.0 lb

## 2020-04-19 DIAGNOSIS — Z1211 Encounter for screening for malignant neoplasm of colon: Secondary | ICD-10-CM | POA: Diagnosis not present

## 2020-04-19 DIAGNOSIS — I480 Paroxysmal atrial fibrillation: Secondary | ICD-10-CM

## 2020-04-19 DIAGNOSIS — K648 Other hemorrhoids: Secondary | ICD-10-CM | POA: Diagnosis not present

## 2020-04-19 DIAGNOSIS — Z800 Family history of malignant neoplasm of digestive organs: Secondary | ICD-10-CM

## 2020-04-19 NOTE — Progress Notes (Signed)
Charles Rasmussen 49 y.o. 10-06-1970 [id number]  Assessment & Plan:   Encounter Diagnoses  Name Primary?  . Colon cancer screening Yes  . PAF (paroxysmal atrial fibrillation) (HCC)   . Family history of colon cancer   . Hemorrhoids with complication     Schedule colonoscopy.  I answered all of his questions to his satisfaction.  Consider elective hemorrhoidal banding perhaps.  Hemorrhoids do not seem that symptomatic.  Note that when he was here we did not particularly review his family history which impacts the surveillance intervals.  Will review when he returns.  I did suggest that he consider taking some of his Valium on the morning of the procedure as he has some anticipatory anxiety.  The risks and benefits as well as alternatives of endoscopic procedure(s) have been discussed and reviewed. All questions answered. The patient agrees to proceed.   CC: Dettinger, Elige Radon, MD    Subjective:   Chief Complaint: Questions about colonoscopy  HPI Patient is a 49 year old white man known to [place] with prior constipation and thrombosed hemorrhoids, status post colonoscopy in 2006 which showed internal and external hemorrhoids.  He takes MiraLAX to treat constipation associated with metoprolol which he takes for rate control with paroxysmal atrial fibrillation.  Father had rectal cancer at the age of 92 and maternal grandfather had colon cancer at 62 and paternal grandfather also had rectal cancer.  He has multiple questions about the procedure and wanted to review in detail with me.  He is a bit anxious about the possibility of triggering his paroxysmal atrial fibrillation.  We reviewed the procedure in detail including the sedation and the preparation and risks and benefits.  I answered his questions to his satisfaction.  He reports hemorrhoidal swelling if he develops constipation.  No significant bleeding problems. No Known Allergies Current Meds  Medication Sig  . Artificial Tear  Solution (TEARS RENEWED OP) Place 1-2 drops into both eyes 3 (three) times daily as needed (for dry eyes).  . diazepam (VALIUM) 2 MG tablet Take 1 tablet (2 mg total) by mouth as needed for anxiety.  Marland Kitchen diltiazem (CARDIZEM) 30 MG tablet Take 1 tablet every 4 hours AS NEEDED for AFIB heart rate >100 as long as blood pressure >100.  . metoprolol tartrate (LOPRESSOR) 25 MG tablet Take 1/2 (one-half) tablet by mouth twice daily   Past Medical History:  Diagnosis Date  . Anxiety   . Atrial fibrillation (HCC)   . Hemorrhoid thrombosis   . Mitral valve prolapse   . Unspecified hemorrhoids without mention of complication 2006  . Venous reflux 01/02/12   deep right per [name]. Hart Rochester, M.D.   Past Surgical History:  Procedure Laterality Date  . COLONOSCOPY    . 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   Review of Systems As per HPI  Objective:   Physical Exam BP 102/70   Pulse 80   Ht 6\' 4"  (1.93 m)   Wt 194 lb (88 kg)  BMI 23.61 kg/m   18 minutes total time

## 2020-04-19 NOTE — Patient Instructions (Signed)
You have been scheduled for a colonoscopy. Please follow written instructions given to you at your visit today.  Please pick up your prep supplies at the pharmacy within the next 1-3 days. If you use inhalers (even only as needed), please bring them with you on the day of your procedure.   Due to recent changes in healthcare laws, you may see the results of your imaging and laboratory studies on MyChart before your provider has had a chance to review them.  We understand that in some cases there may be results that are confusing or concerning to you. Not all laboratory results come back in the same time frame and the provider may be waiting for multiple results in order to interpret others.  Please give us 48 hours in order for your provider to thoroughly review all the results before contacting the office for clarification of your results.    I appreciate the opportunity to care for you. Carl Gessner, MD, FACG 

## 2020-04-24 ENCOUNTER — Ambulatory Visit (HOSPITAL_COMMUNITY)
Admission: RE | Admit: 2020-04-24 | Discharge: 2020-04-24 | Disposition: A | Discharge status: Home / Self Care | Payer: BC Managed Care – PPO | Source: Ambulatory Visit | Attending: Family | Admitting: Family

## 2020-04-24 ENCOUNTER — Other Ambulatory Visit: Payer: Self-pay

## 2020-04-24 DIAGNOSIS — R1903 Right lower quadrant abdominal swelling, mass and lump: Secondary | ICD-10-CM | POA: Diagnosis present

## 2020-05-02 ENCOUNTER — Encounter: Payer: Self-pay | Admitting: Family Medicine

## 2020-05-02 ENCOUNTER — Ambulatory Visit: Payer: Self-pay | Admitting: Family Medicine

## 2020-05-02 ENCOUNTER — Other Ambulatory Visit: Payer: Self-pay

## 2020-05-02 VITALS — BP 121/67 | HR 75 | Temp 97.7°F | Ht 76.0 in | Wt 196.0 lb

## 2020-05-02 DIAGNOSIS — Z0289 Encounter for other administrative examinations: Secondary | ICD-10-CM

## 2020-05-02 LAB — URINALYSIS
Bilirubin, UA: NEGATIVE
Glucose, UA: NEGATIVE
Ketones, UA: NEGATIVE
Leukocytes,UA: NEGATIVE
Nitrite, UA: NEGATIVE
Protein,UA: NEGATIVE
RBC, UA: NEGATIVE
Specific Gravity, UA: 1.025 (ref 1.005–1.030)
Urobilinogen, Ur: 0.2 mg/dL (ref 0.2–1.0)
pH, UA: 7 (ref 5.0–7.5)

## 2020-05-02 NOTE — Progress Notes (Signed)
DOT exam done. Form attached

## 2020-05-10 NOTE — Progress Notes (Signed)
Patient notified

## 2020-05-13 ENCOUNTER — Other Ambulatory Visit (HOSPITAL_COMMUNITY): Payer: Self-pay | Admitting: Nurse Practitioner

## 2020-05-16 ENCOUNTER — Telehealth: Payer: Self-pay

## 2020-05-25 ENCOUNTER — Encounter: Payer: Self-pay | Admitting: Internal Medicine

## 2020-05-29 ENCOUNTER — Ambulatory Visit (AMBULATORY_SURGERY_CENTER): Payer: BC Managed Care – PPO | Admitting: Internal Medicine

## 2020-05-29 ENCOUNTER — Encounter: Payer: Self-pay | Admitting: Internal Medicine

## 2020-05-29 ENCOUNTER — Other Ambulatory Visit: Payer: Self-pay

## 2020-05-29 VITALS — BP 106/66 | HR 70 | Temp 96.9°F | Resp 18 | Ht 76.0 in | Wt 194.0 lb

## 2020-05-29 DIAGNOSIS — Z800 Family history of malignant neoplasm of digestive organs: Secondary | ICD-10-CM

## 2020-05-29 DIAGNOSIS — Z1211 Encounter for screening for malignant neoplasm of colon: Secondary | ICD-10-CM | POA: Diagnosis not present

## 2020-05-29 MED ORDER — SODIUM CHLORIDE 0.9 % IV SOLN: 500.0000 mL | Freq: Once | INTRAVENOUS | Status: DC

## 2020-05-29 NOTE — Progress Notes (Signed)
A/ox3, pleased with MAC, report to RN 

## 2020-05-29 NOTE — Patient Instructions (Addendum)
Charles Rasmussen I am pleased to tell you that the colonoscopy was completely normal.  Given your family history of colon and rectal cancer I recommend that you repeat a colonoscopy in 5 years, that will be in 2026.  Your siblings should also have colonoscopies by the time they are 40-ish.  I appreciate the opportunity to care for you. Iva Boop, MD, FACG    YOU HAD AN ENDOSCOPIC PROCEDURE TODAY AT THE [facility]:   Refer to the procedure report that was given to you for any specific questions about what was found during the examination.  If the procedure report does not answer your questions, please call your gastroenterologist to clarify.  If you requested that your care partner not be given the details of your procedure findings, then the procedure report has been included in a sealed envelope for you to review at your convenience later.  YOU SHOULD EXPECT: Some feelings of bloating in the abdomen. Passage of more gas than usual.  Walking can help get rid of the air that was put into your GI tract during the procedure and reduce the bloating. If you had a lower endoscopy (such as a colonoscopy or flexible sigmoidoscopy) you may notice spotting of blood in your stool or on the toilet paper. If you underwent a bowel prep for your procedure, you may not have a normal bowel movement for a few days.  Please Note:  You might notice some irritation and congestion in your nose or some drainage.  This is from the oxygen used during your procedure.  There is no need for concern and it should clear up in a day or so.  SYMPTOMS TO REPORT IMMEDIATELY:   Following lower endoscopy (colonoscopy or flexible sigmoidoscopy):  Excessive amounts of blood in the stool  Significant tenderness or worsening of abdominal pains  Swelling of the abdomen that is new, acute  Fever of 100F or higher    For urgent or emergent issues, a gastroenterologist can be reached at any hour by calling [phone]  [phone]. Do not use MyChart messaging for urgent concerns.    DIET:  We do recommend a small meal at first, but then you may proceed to your regular diet.  Drink plenty of fluids but you should avoid alcoholic beverages for 24 hours.  ACTIVITY:  You should plan to take it easy for the rest of today and you should NOT DRIVE or use heavy machinery until tomorrow (because of the sedation medicines used during the test).    FOLLOW UP: Our staff will call the number listed on your records 48-72 hours following your procedure to check on you and address any questions or concerns that you may have regarding the information given to you following your procedure. If we do not reach you, we will leave a message.  We will attempt to reach you two times.  During this call, we will ask if you have developed any symptoms of COVID 19. If you develop any symptoms (ie: fever, flu-like symptoms, shortness of breath, cough etc.) before then, please call [phone].  If you test positive for Covid 19 in the 2 weeks post procedure, please call and report this information to [place].    If any biopsies were taken you will be contacted by phone or by letter within the next 1-3 weeks.  Please call us at [phone] if you have not heard about the biopsies in 3 weeks.    SIGNATURES/CONFIDENTIALITY: You and/or your care partner  have signed paperwork which will be entered into your electronic medical record.  These signatures attest to the fact that that the information above on your After Visit Summary has been reviewed and is understood.  Full responsibility of the confidentiality of this discharge information lies with you and/or your care-partner.

## 2020-05-29 NOTE — Progress Notes (Signed)
Calvert vitals and AW IV.

## 2020-05-29 NOTE — Op Note (Signed)
[facility] Patient Name: Charles Rasmussen Procedure Date: 05/29/2020 11:31 AM MRN: [id number] Endoscopist: Iva Boop , MD Age: 49 Referring MD:  Date of Birth: 08/22/1970 Gender: Male Account #: [id number] Procedure:                Colonoscopy Indications:              Screening in patient at increased risk: rectal                            cancer in father 47 + 2 elderly grandfathers w/                            colon cancer Medicines:                Propofol per Anesthesia, Monitored Anesthesia Care Procedure:                Pre-Anesthesia Assessment:                           - Prior to the procedure, a History and Physical                            was performed, and patient medications and                            allergies were reviewed. The patient's tolerance of                            previous anesthesia was also reviewed. The risks                            and benefits of the procedure and the sedation                            options and risks were discussed with the patient.                            All questions were answered, and informed consent                            was obtained. Prior Anticoagulants: The patient has                            taken no previous anticoagulant or antiplatelet                            agents. ASA Grade Assessment: II - A patient with                            mild systemic disease. After reviewing the risks                            and benefits, the patient was deemed in  satisfactory condition to undergo the procedure.                           After obtaining informed consent, the colonoscope                            was passed under direct vision. Throughout the                            procedure, the patient's blood pressure, pulse, and                            oxygen saturations were monitored continuously. The                            Colonoscope was introduced  through the anus and                            advanced to the the cecum, identified by                            appendiceal orifice and ileocecal valve. The                            colonoscopy was somewhat difficult due to                            significant looping. Successful completion of the                            procedure was aided by straightening and shortening                            the scope to obtain bowel loop reduction and                            applying abdominal pressure. The patient tolerated                            the procedure well. The quality of the bowel                            preparation was excellent. The bowel preparation                            used was Miralax via split dose instruction. The                            ileocecal valve, appendiceal orifice, and rectum                            were photographed. Scope In: 11:41:57 AM Scope Out: 12:01:25 PM Scope Withdrawal Time: 0 hours 10 minutes 20 seconds  Total Procedure Duration: 0 hours 19 minutes 28  seconds  Findings:                 The perianal and digital rectal examinations were                            normal.                           The colon (entire examined portion) appeared normal.                           No additional abnormalities were found on                            retroflexion. Complications:            No immediate complications. Estimated Blood Loss:     Estimated blood loss: none. Impression:               - The entire examined colon is normal.                           - No specimens collected. Recommendation:           - Patient has a contact number available for                            emergencies. The signs and symptoms of potential                            delayed complications were discussed with the                            patient. Return to normal activities tomorrow.                            Written discharge instructions were  provided to the                            patient.                           - Resume previous diet.                           - Continue present medications.                           - Repeat colonoscopy in 5 years for screening                            purposes. FHx CRCA father and 2 grandfathers Iva Boop, MD 05/29/2020 12:14:26 PM This report has been signed electronically.

## 2020-05-31 ENCOUNTER — Telehealth: Payer: Self-pay

## 2020-05-31 NOTE — Telephone Encounter (Signed)
  Follow up Call-  Call back number 05/29/2020  Post procedure Call Back phone  # [phone]  Permission to leave phone message Yes  Some recent data might be hidden     Patient questions:  Do you have a fever, pain , or abdominal swelling? No. Pain Score  0 *  Have you tolerated food without any problems? Yes.    Have you been able to return to your normal activities? Yes.    Do you have any questions about your discharge instructions: Diet   No. Medications  No. Follow up visit  No.  Do you have questions or concerns about your Care? No.  Actions: * If pain score is 4 or above: No action needed, pain <4.  1. Have you developed a fever since your procedure? no  2.   Have you had an respiratory symptoms (SOB or cough) since your procedure? no  3.   Have you tested positive for COVID 19 since your procedure no  4.   Have you had any family members/close contacts diagnosed with the COVID 19 since your procedure?  no   If yes to any of these questions please route to Laverna Peace, RN and Karlton Lemon, RN

## 2020-06-25 ENCOUNTER — Other Ambulatory Visit (HOSPITAL_COMMUNITY): Payer: Self-pay | Admitting: *Deleted

## 2020-06-25 MED ORDER — DILTIAZEM HCL 30 MG PO TABS
ORAL_TABLET | ORAL | 3 refills | Status: DC
Start: 2020-06-25 — End: 2021-03-15

## 2020-10-27 ENCOUNTER — Other Ambulatory Visit (HOSPITAL_COMMUNITY): Payer: Self-pay | Admitting: Internal Medicine

## 2020-11-26 ENCOUNTER — Telehealth: Payer: Self-pay | Admitting: Internal Medicine

## 2020-11-26 MED ORDER — METOPROLOL TARTRATE 25 MG PO TABS
ORAL_TABLET | ORAL | 0 refills | Status: DC
Start: 2020-11-26 — End: 2020-12-26

## 2020-11-26 NOTE — Telephone Encounter (Signed)
*  STAT* If patient is at the pharmacy, call can be transferred to refill team.   1. Which medications need to be refilled? (please list name of each medication and dose if known) metoprolol  2. Which pharmacy/location (including street and city if local pharmacy) is medication to be sent to? [facility]  3. Do they need a 30 day or 90 day supply? 90   

## 2020-11-26 NOTE — Telephone Encounter (Signed)
Pt's medication was sent to pt's pharmacy as requested. Confirmation received.  °

## 2020-11-29 ENCOUNTER — Ambulatory Visit: Payer: BC Managed Care – PPO | Admitting: Internal Medicine

## 2020-11-29 ENCOUNTER — Other Ambulatory Visit: Payer: Self-pay

## 2020-11-29 ENCOUNTER — Encounter: Payer: Self-pay | Admitting: Internal Medicine

## 2020-11-29 VITALS — BP 118/74 | HR 68 | Ht 76.0 in | Wt 197.0 lb

## 2020-11-29 DIAGNOSIS — I493 Ventricular premature depolarization: Secondary | ICD-10-CM | POA: Diagnosis not present

## 2020-11-29 DIAGNOSIS — I480 Paroxysmal atrial fibrillation: Secondary | ICD-10-CM | POA: Diagnosis not present

## 2020-11-29 NOTE — Progress Notes (Signed)
      Patient Care Team: Dettinger, Elige Radon, MD as PCP - General (Family Medicine) Laurey Morale, MD (Cardiology) Arminda Resides, MD (Dermatology) Olena Leatherwood., MD (Ophthalmology)   HPI  Charles Rasmussen is a 50 y.o. male Seen in follow-up for palpitations which include and surprisingly PVCs atrial fibrillation requiring cardioversion most recently 09/03/16 ( AFIB CLINIC reports 2/21)     Intermittent palpitations.  Quite abrupt.  May be worse at the end of the week and after he has been on dates.  Thinks that increase metoprolol tartrate is associated with more lethargy.  In 2018 we have given a prescription for 3 different beta-blockers.  Reviewing the note from the A. fib clinic he did not ever undertake that trial   Anxiety remains a major issue per previous notes-- been back at school.  Is vaccinated  Thromboembolic risk factors ( neg) for a CHADSVASc Score of 0   Past Medical History:  Diagnosis Date  . Anxiety   . Atrial fibrillation (HCC)   . Hemorrhoid thrombosis   . Mitral valve prolapse   . Unspecified hemorrhoids without mention of complication 2006  . Venous reflux 01/02/12   deep right per [name]. Hart Rochester, M.D.    Past Surgical History:  Procedure Laterality Date  . COLONOSCOPY    . MOUTH SURGERY     wisdom teeth    Current Outpatient Medications  Medication Sig Dispense Refill  . diazepam (VALIUM) 2 MG tablet Take 1 tablet (2 mg total) by mouth as needed for anxiety. 10 tablet 1  . diltiazem (CARDIZEM) 30 MG tablet Take 1 tablet every 4 hours AS NEEDED for AFIB heart rate >100 as long as blood pressure >100. 45 tablet 3  . metoprolol tartrate (LOPRESSOR) 25 MG tablet Take 1/2 (one-half) tablet by mouth twice daily. Please keep upcoming appt with Dr. Graciela Husbands in May 2022 before anymore refills. Thank you Final Attempt 30 tablet 0   No current facility-administered medications for this visit.    No Known Allergies    Review of Systems negative  except from HPI and PMH  Physical Exam BP 118/74   Pulse 68   Ht 6\' 4"  (1.93 m)   Wt 197 lb (89.4 kg)   SpO2 98%   BMI 23.98 kg/m  Well developed and nourished in no acute distress HENT normal Neck supple with JVP-  flat   Clear Regular rate and rhythm, no murmurs or gallops Abd-soft with active BS No Clubbing cyanosis edema Skin-warm and dry A & Oriented  Grossly normal sensory and motor function  ECG sinus at 68 17/09/37.    Assessment and  Plan  Palpitations-PVCs  Side effects ? BB  Atrial Fib paroxysmal  Anxiety    Palpitations persist.  Probably PVCs and not atrial fibrillation.  We will begin we will try an alternative to the metoprolol tartrate which is associated with fatigue.  Have given him a prescription for atenolol 25, bisoprolol 5 and nebivolol 5 with the idea that he would take half to see whether they were associated with less fatigue.  No interval atrial fibrillation of which he is aware

## 2020-11-29 NOTE — Patient Instructions (Signed)
Medication Instructions:  Your physician has recommended you make the following change in your medication: .  You are being given three prescriptions for different beta blockers to try.  You may take these in any order. DO NOT TAKE MORE THAN ONE BETA BLOCKER AT A TIME. Choose one beta blocker to start with. If after two weeks, you feel the medication is working well for you, continue that current medication. If it does not work well for you, try another prescription for a beta blocker at that time. You may continue that current beta blocker at that time if it works well for you. If it does not, repeat these instructions for the third beta blocker.  When you find a beta blocker that works well for you, call the office and we will send in a prescription for you.               ** Atenolol 25mg  tablet - 1/2 tablet by mouth daily                              ** Nebivolol 5mg  tablet - 1/2 tablet by mouth daily             ** Bisoprolol 5mg  tablet - 1/2 tablet by mouth daily  You may take these in any order you please.   Please call with any questions.     *If you need a refill on your cardiac medications before your next appointment, please call your pharmacy*   Lab Work: None ordered.  If you have labs (blood work) drawn today and your tests are completely normal, you will receive your results only by: MyChart Message (if you have MyChart) OR . A paper copy in the mail If you have any lab test that is abnormal or we need to change your treatment, we will call you to review the results.   Testing/Procedures: None ordered.    Follow-Up: At [facility], you and your health needs are our priority.  As part of our continuing mission to provide you with exceptional heart care, we have created designated Provider Care Teams.  These Care Teams include your primary Cardiologist (physician) and Advanced Practice Providers (APPs -  Physician Assistants and Nurse Practitioners) who  all work together to provide you with the care you need, when you need it.  We recommend signing up for the patient portal called "MyChart".  Sign up information is provided on this After Visit Summary.  MyChart is used to connect with patients for Virtual Visits (Telemedicine).  Patients are able to view lab/test results, encounter notes, upcoming appointments, etc.  Non-urgent messages can be sent to your provider as well.   To learn more about what you can do with MyChart, go to [place].    Your next appointment:   12 month(s)  The format for your next appointment:   In Person  Provider:   Marland Kitchen, MD

## 2020-12-05 ENCOUNTER — Telehealth: Payer: Self-pay | Admitting: Internal Medicine

## 2020-12-05 NOTE — Telephone Encounter (Signed)
Pt c/o medication issue:  1. Name of Medication: Bisoprolol 5 mg tablet  2. How are you currently taking this medication (dosage and times per day)? 1/2 pill daily  3. Are you having a reaction (difficulty breathing--STAT)? No  4. What is your medication issue?  Pt was told by the pharmacist that this medicine wont last as long as his other meds, Pt have not started taking this medicine yet because he wants to know how long it will last Please advise

## 2020-12-05 NOTE — Telephone Encounter (Signed)
Pt calling to ask about the half life of bisoprolol. He states the pharmacist said the half life is shorter than nebivolol and metoprolol. I explained to him the nature of the BB trial. If he tries the bisoprolol and continues to have frequent symptoms, he can try another BB. He may also call the office to see if he can have a dose increase.   He verbalized understanding and had no additional questions.

## 2020-12-26 ENCOUNTER — Telehealth: Payer: Self-pay | Admitting: Internal Medicine

## 2020-12-26 MED ORDER — METOPROLOL TARTRATE 25 MG PO TABS
ORAL_TABLET | ORAL | 3 refills | Status: DC
Start: 2020-12-26 — End: 2021-11-07

## 2020-12-26 NOTE — Telephone Encounter (Signed)
Pt's medication was sent to pt's pharmacy as requested. Confirmation received.  °

## 2020-12-26 NOTE — Telephone Encounter (Signed)
*  STAT* If patient is at the pharmacy, call can be transferred to refill team.   1. Which medications need to be refilled? (please list name of each medication and dose if known) metoprolol tartrate (LOPRESSOR) 25 MG tablet  2. Which pharmacy/location (including street and city if local pharmacy) is medication to be sent to? [facility] [address], [place] - [address]  3. Do they need a 30 day or 90 day supply? 90

## 2021-01-01 ENCOUNTER — Encounter: Payer: Self-pay | Admitting: Family Medicine

## 2021-01-01 ENCOUNTER — Ambulatory Visit (INDEPENDENT_AMBULATORY_CARE_PROVIDER_SITE_OTHER): Payer: BC Managed Care – PPO | Admitting: Family Medicine

## 2021-01-01 ENCOUNTER — Other Ambulatory Visit: Payer: Self-pay

## 2021-01-01 VITALS — BP 104/70 | HR 92 | Ht 76.0 in | Wt 194.0 lb

## 2021-01-01 DIAGNOSIS — Z114 Encounter for screening for human immunodeficiency virus [HIV]: Secondary | ICD-10-CM | POA: Diagnosis not present

## 2021-01-01 DIAGNOSIS — Z0000 Encounter for general adult medical examination without abnormal findings: Secondary | ICD-10-CM | POA: Diagnosis not present

## 2021-01-01 DIAGNOSIS — Z1159 Encounter for screening for other viral diseases: Secondary | ICD-10-CM

## 2021-01-01 NOTE — Progress Notes (Signed)
BP 104/70   Pulse 92   Ht '6\' 4"'  (1.93 m)   Wt 194 lb (88 kg)   SpO2 95%   BMI 23.61 kg/m    Subjective:   Patient ID: Charles Rasmussen, male    DOB: 18-Oct-1970, 50 y.o.   MRN: [id number]  HPI: Charles Rasmussen is a 50 y.o. male presenting on 01/01/2021 for Medical Management of Chronic Issues (CPE)   HPI Adult well exam Patient is coming today for well adult exam and physical and recheck of chronic medical issues.  He does have paroxysmal A. fib but denies any issues with it.  Patient denies any chest pain, shortness of breath, headaches or vision issues, abdominal complaints, diarrhea, nausea, vomiting, or joint issues.   Relevant past medical, surgical, family and social history reviewed and updated as indicated. Interim medical history since our last visit reviewed. Allergies and medications reviewed and updated.  Review of Systems  Constitutional:  Negative for chills and fever.  HENT:  Negative for ear pain and tinnitus.   Eyes:  Negative for pain and visual disturbance.  Respiratory:  Negative for cough, shortness of breath and wheezing.   Cardiovascular:  Negative for chest pain, palpitations and leg swelling.  Gastrointestinal:  Negative for abdominal pain, blood in stool, constipation and diarrhea.  Genitourinary:  Negative for dysuria and hematuria.  Musculoskeletal:  Negative for back pain, gait problem and myalgias.  Skin:  Negative for rash.  Neurological:  Negative for dizziness, weakness and headaches.  Psychiatric/Behavioral:  Negative for suicidal ideas.   All other systems reviewed and are negative.  Per HPI unless specifically indicated above   Allergies as of 01/01/2021   No Known Allergies      Medication List        Accurate as of January 01, 2021 10:56 AM. If you have any questions, ask your nurse or doctor.          diazepam 2 MG tablet Commonly known as: Valium Take 1 tablet (2 mg total) by mouth as needed for anxiety.   diltiazem 30 MG  tablet Commonly known as: Cardizem Take 1 tablet every 4 hours AS NEEDED for AFIB heart rate >100 as long as blood pressure >100.   metoprolol tartrate 25 MG tablet Commonly known as: LOPRESSOR Take 1/2 (one-half) tablet by mouth twice daily.         Objective:   BP 104/70   Pulse 92   Ht '6\' 4"'  (1.93 m)   Wt 194 lb (88 kg)   SpO2 95%   BMI 23.61 kg/m   Wt Readings from Last 3 Encounters:  01/01/21 194 lb (88 kg)  11/29/20 197 lb (89.4 kg)  05/29/20 194 lb (88 kg)    Physical Exam Vitals reviewed.  Constitutional:      General: He is not in acute distress.    Appearance: He is well-developed. He is not diaphoretic.  HENT:     Right Ear: External ear normal.     Left Ear: External ear normal.     Nose: Nose normal.     Mouth/Throat:     Pharynx: No oropharyngeal exudate.  Eyes:     General: No scleral icterus.       Right eye: No discharge.     Conjunctiva/sclera: Conjunctivae normal.     Pupils: Pupils are equal, round, and reactive to light.  Neck:     Thyroid: No thyromegaly.  Cardiovascular:     Rate and Rhythm: Normal  rate and regular rhythm.     Heart sounds: Normal heart sounds. No murmur heard. Pulmonary:     Effort: Pulmonary effort is normal. No respiratory distress.     Breath sounds: Normal breath sounds. No wheezing.  Abdominal:     General: Bowel sounds are normal. There is no distension.     Palpations: Abdomen is soft.     Tenderness: There is no abdominal tenderness. There is no guarding or rebound.  Musculoskeletal:        General: Normal range of motion.     Cervical back: Neck supple.  Lymphadenopathy:     Cervical: No cervical adenopathy.  Skin:    General: Skin is warm and dry.     Findings: No rash.  Neurological:     Mental Status: He is alert and oriented to person, place, and time.     Coordination: Coordination normal.  Psychiatric:        Behavior: Behavior normal.      Assessment & Plan:   Problem List Items  Addressed This Visit   None Visit Diagnoses     Well adult exam    -  Primary   Relevant Orders   CBC with Differential/Platelet   CMP14+EGFR   Lipid panel   PSA, total and free   Hepatitis C antibody   HIV Antibody (routine testing w rflx)   Screening for HIV without presence of risk factors       Relevant Orders   HIV Antibody (routine testing w rflx)   Need for hepatitis C screening test       Relevant Orders   Hepatitis C antibody       Will check blood work, and continue current medications including diltiazem and metoprolol for A. fib.  He also sees cardiologist on a regular basis. Follow up plan: Return in about 1 year (around 01/01/2022), or if symptoms worsen or fail to improve, for Physical exam.  Counseling provided for all of the vaccine components Orders Placed This Encounter  Procedures   CBC with Differential/Platelet   CMP14+EGFR   Lipid panel   PSA, total and free   Hepatitis C antibody   HIV Antibody (routine testing w rflx)    Caryl Pina, MD [place] 01/01/2021, 10:56 AM

## 2021-03-14 ENCOUNTER — Ambulatory Visit: Payer: BC Managed Care – PPO | Admitting: Family Medicine

## 2021-03-14 ENCOUNTER — Encounter: Payer: Self-pay | Admitting: Family Medicine

## 2021-03-14 ENCOUNTER — Other Ambulatory Visit: Payer: Self-pay

## 2021-03-14 VITALS — BP 94/65 | HR 76 | Temp 97.4°F | Ht 76.0 in | Wt 196.0 lb

## 2021-03-14 DIAGNOSIS — R079 Chest pain, unspecified: Secondary | ICD-10-CM | POA: Diagnosis not present

## 2021-03-14 NOTE — Progress Notes (Signed)
Subjective:  Patient ID: Charles Rasmussen, male    DOB: 1970-08-22, 50 y.o.   MRN: [id number]  Patient Care Team: Dettinger, Fransisca Kaufmann, MD as PCP - General (Family Medicine) Larey Dresser, MD (Cardiology) Danella Sensing, MD (Dermatology) Sandrea Matte., MD (Ophthalmology)   Chief Complaint:  Chest Pain   HPI: Charles Rasmussen is a 50 y.o. male presenting on 03/14/2021 for Chest Pain   Pt presents today with complaints of intermittent chest pain in left anterior chest. States he was carrying 5 gallon cans of gas about 3-4 weeks ago and has been lifting heavy objects. He states the pain is pressure like and is reproducible when he presses on his chest.   Chest Pain  This is a recurrent problem. The current episode started 1 to 4 weeks ago. The onset quality is gradual. The problem occurs intermittently. The problem has been waxing and waning. The pain is present in the lateral region. The pain is at a severity of 4/10. The pain is mild. The quality of the pain is described as pressure. The pain does not radiate. Pertinent negatives include no abdominal pain, back pain, claudication, cough, diaphoresis, dizziness, exertional chest pressure, fever, headaches, hemoptysis, irregular heartbeat, leg pain, lower extremity edema, malaise/fatigue, nausea, near-syncope, numbness, orthopnea, palpitations, PND, shortness of breath, sputum production, syncope, vomiting or weakness. Associated with: palpation and pushing down on something with his left arm. He has tried antacids for the symptoms. The treatment provided no relief. Risk factors include male gender.  His past medical history is significant for arrhythmia, hyperlipidemia and valve disorder.  Pertinent negatives for past medical history include no seizures.    Relevant past medical, surgical, family, and social history reviewed and updated as indicated.  Allergies and medications reviewed and updated. Data reviewed: Chart in Epic.   Past  Medical History:  Diagnosis Date   Anxiety    Atrial fibrillation ([place])    Hemorrhoid thrombosis    Mitral valve prolapse    Unspecified hemorrhoids without mention of complication 1027   Venous reflux 01/02/12   deep right per Nelda Severe. Kellie Simmering, M.D.    Past Surgical History:  Procedure Laterality Date   COLONOSCOPY     MOUTH SURGERY     wisdom teeth    Social History   Socioeconomic History   Marital status: Single    Spouse name: Not on file   Number of children: 0   Years of education: Not on file   Highest education level: Not on file  Occupational History    Employer: [employer]  Tobacco Use   Smoking status: Never   Smokeless tobacco: Never  Vaping Use   Vaping Use: Never used  Substance and Sexual Activity   Alcohol use: No   Drug use: No   Sexual activity: Not on file  Other Topics Concern   Not on file  Social History Narrative   He is single, no children, he coordinates a migrant education program for [employer]   No alcohol tobacco or drug use   Social Determinants of Health   Financial Resource Strain: Not on file  Food Insecurity: Not on file  Transportation Needs: Not on file  Physical Activity: Not on file  Stress: Not on file  Social Connections: Not on file  Intimate Partner Violence: Not on file    Outpatient Encounter Medications as of 03/14/2021  Medication Sig   diltiazem (CARDIZEM) 30 MG tablet Take 1 tablet  every 4 hours AS NEEDED for AFIB heart rate >100 as long as blood pressure >100.   metoprolol tartrate (LOPRESSOR) 25 MG tablet Take 1/2 (one-half) tablet by mouth twice daily.   diazepam (VALIUM) 2 MG tablet Take 1 tablet (2 mg total) by mouth as needed for anxiety. (Patient not taking: Reported on 03/14/2021)   No facility-administered encounter medications on file as of 03/14/2021.    No Known Allergies  Review of Systems  Constitutional:  Negative for activity change, appetite change, chills,  diaphoresis, fatigue, fever, malaise/fatigue and unexpected weight change.  HENT: Negative.    Eyes: Negative.   Respiratory:  Negative for cough, hemoptysis, sputum production, choking, chest tightness, shortness of breath, wheezing and stridor.   Cardiovascular:  Positive for chest pain. Negative for palpitations, orthopnea, claudication, leg swelling, syncope, PND and near-syncope.  Gastrointestinal:  Negative for abdominal pain, blood in stool, constipation, diarrhea, nausea and vomiting.  Endocrine: Negative.   Genitourinary:  Negative for decreased urine volume, difficulty urinating, dysuria, frequency and urgency.  Musculoskeletal:  Negative for arthralgias, back pain and myalgias.  Skin: Negative.   Allergic/Immunologic: Negative.   Neurological:  Negative for dizziness, tremors, seizures, syncope, facial asymmetry, speech difficulty, weakness, light-headedness, numbness and headaches.  Hematological: Negative.   Psychiatric/Behavioral:  Negative for confusion, hallucinations, sleep disturbance and suicidal ideas.   All other systems reviewed and are negative.      Objective:  BP 94/65   Pulse 76   Temp (!) 97.4 F (36.3 C) (Temporal)   Ht '6\' 4"'  (1.93 m)   Wt 196 lb (88.9 kg)   SpO2 97%   BMI 23.86 kg/m    Wt Readings from Last 3 Encounters:  03/14/21 196 lb (88.9 kg)  01/01/21 194 lb (88 kg)  11/29/20 197 lb (89.4 kg)    Physical Exam Vitals and nursing note reviewed.  Constitutional:      General: He is not in acute distress.    Appearance: Normal appearance. He is well-developed, well-groomed and normal weight. He is not ill-appearing, toxic-appearing or diaphoretic.  HENT:     Head: Normocephalic and atraumatic.     Jaw: There is normal jaw occlusion.     Right Ear: Hearing normal.     Left Ear: Hearing normal.     Nose: Nose normal.     Mouth/Throat:     Lips: Pink.     Mouth: Mucous membranes are moist.     Pharynx: Oropharynx is clear. Uvula midline.   Eyes:     General: Lids are normal.     Extraocular Movements: Extraocular movements intact.     Conjunctiva/sclera: Conjunctivae normal.     Pupils: Pupils are equal, round, and reactive to light.  Neck:     Thyroid: No thyroid mass, thyromegaly or thyroid tenderness.     Vascular: No carotid bruit or JVD.     Trachea: Trachea and phonation normal.  Cardiovascular:     Rate and Rhythm: Normal rate and regular rhythm. No extrasystoles are present.    Chest Wall: PMI is not displaced.     Pulses: Normal pulses.     Heart sounds: Normal heart sounds. Heart sounds not distant. No murmur heard.   No friction rub. No gallop.  Pulmonary:     Effort: Pulmonary effort is normal. No tachypnea, accessory muscle usage or respiratory distress.     Breath sounds: Normal breath sounds. No stridor. No wheezing.  Chest:     Chest wall: Tenderness (pain with palpitation  to left anterior chest) present. No mass, deformity, crepitus or edema. There is no dullness to percussion.  Abdominal:     General: Bowel sounds are normal. There is no distension or abdominal bruit.     Palpations: Abdomen is soft. There is no hepatomegaly or splenomegaly.     Tenderness: There is no abdominal tenderness. There is no right CVA tenderness or left CVA tenderness.     Hernia: No hernia is present.  Musculoskeletal:        General: Normal range of motion.     Cervical back: Normal range of motion and neck supple.     Right lower leg: No edema.     Left lower leg: No edema.  Lymphadenopathy:     Cervical: No cervical adenopathy.  Skin:    General: Skin is warm and dry.     Capillary Refill: Capillary refill takes less than 2 seconds.     Coloration: Skin is not cyanotic, jaundiced or pale.     Findings: No rash.  Neurological:     General: No focal deficit present.     Mental Status: He is alert and oriented to person, place, and time.     Cranial Nerves: Cranial nerves are intact. No cranial nerve deficit.      Sensory: Sensation is intact. No sensory deficit.     Motor: Motor function is intact. No weakness.     Coordination: Coordination is intact. Coordination normal.     Gait: Gait is intact. Gait normal.     Deep Tendon Reflexes: Reflexes are normal and symmetric. Reflexes normal.  Psychiatric:        Attention and Perception: Attention and perception normal.        Mood and Affect: Affect normal. Mood is anxious.        Speech: Speech normal.        Behavior: Behavior normal. Behavior is not agitated. Behavior is cooperative.        Thought Content: Thought content normal.        Cognition and Memory: Cognition and memory normal.        Judgment: Judgment normal.    Results for orders placed or performed in visit on 05/02/20  Urinalysis  Result Value Ref Range   Specific Gravity, UA 1.025 1.005 - 1.030   pH, UA 7.0 5.0 - 7.5   Color, UA Yellow Yellow   Appearance Ur Cloudy (A) Clear   Leukocytes,UA Negative Negative   Protein,UA Negative Negative/Trace   Glucose, UA Negative Negative   Ketones, UA Negative Negative   RBC, UA Negative Negative   Bilirubin, UA Negative Negative   Urobilinogen, Ur 0.2 0.2 - 1.0 mg/dL   Nitrite, UA Negative Negative     EKG in office: SR 66, PR 170 ms, QT 362 ms, no acute ST-T changes, no ectopy. Incomplete BBB. Slight changes in lead V2 when compared to prior EKG. Monia Pouch, FNP-C.  Pertinent labs & imaging results that were available during my care of the patient were reviewed by me and considered in my medical decision making.  Assessment & Plan:  Charles Rasmussen was seen today for chest pain.  Diagnoses and all orders for this visit:  Chest pain, unspecified type EKG without acute ischemic changes in office. Intermittent chest pain for over 3 weeks which can be reproduced with palpation. No other associated symptoms. Will obtain below labs to rule out blood clot and possible ischemia. No indications of ACS. Pt aware to make follow up  with  cardiologist and aware of symptoms which require emergent evaluation and treatment.  -     EKG 12-Lead -     CBC with Differential/Platelet -     BMP8+EGFR -     D-dimer, quantitative -     CK total and CKMB (cardiac)not at [facility] -     Troponin T     Continue all other maintenance medications.  Follow up plan: Return if symptoms worsen or fail to improve.   Continue healthy lifestyle choices, including diet (rich in fruits, vegetables, and lean proteins, and low in salt and simple carbohydrates) and exercise (at least 30 minutes of moderate physical activity daily).  Educational handout given for nonspecific chest pain  The above assessment and management plan was discussed with the patient. The patient verbalized understanding of and has agreed to the management plan. Patient is aware to call the clinic if they develop any new symptoms or if symptoms persist or worsen. Patient is aware when to return to the clinic for a follow-up visit. Patient educated on when it is appropriate to go to the emergency department.   Monia Pouch, FNP-C [facility] [phone]

## 2021-03-15 ENCOUNTER — Other Ambulatory Visit: Payer: Self-pay

## 2021-03-15 LAB — CBC WITH DIFFERENTIAL/PLATELET
Basophils Absolute: 0.1 10*3/uL (ref 0.0–0.2)
Basos: 1 %
EOS (ABSOLUTE): 0.1 10*3/uL (ref 0.0–0.4)
Eos: 2 %
Hematocrit: 42.7 % (ref 37.5–51.0)
Hemoglobin: 14.3 g/dL (ref 13.0–17.7)
Immature Grans (Abs): 0 10*3/uL (ref 0.0–0.1)
Immature Granulocytes: 0 %
Lymphocytes Absolute: 1.6 10*3/uL (ref 0.7–3.1)
Lymphs: 22 %
MCH: 29.7 pg (ref 26.6–33.0)
MCHC: 33.5 g/dL (ref 31.5–35.7)
MCV: 89 fL (ref 79–97)
Monocytes Absolute: 0.5 10*3/uL (ref 0.1–0.9)
Monocytes: 7 %
Neutrophils Absolute: 4.8 10*3/uL (ref 1.4–7.0)
Neutrophils: 68 %
Platelets: 292 10*3/uL (ref 150–450)
RBC: 4.82 x10E6/uL (ref 4.14–5.80)
RDW: 12.7 % (ref 11.6–15.4)
WBC: 7.1 10*3/uL (ref 3.4–10.8)

## 2021-03-15 LAB — BMP8+EGFR
BUN/Creatinine Ratio: 12 (ref 9–20)
BUN: 12 mg/dL (ref 6–24)
CO2: 25 mmol/L (ref 20–29)
Calcium: 9.4 mg/dL (ref 8.7–10.2)
Chloride: 101 mmol/L (ref 96–106)
Creatinine, Ser: 1.03 mg/dL (ref 0.76–1.27)
Glucose: 84 mg/dL (ref 65–99)
Potassium: 4.3 mmol/L (ref 3.5–5.2)
Sodium: 137 mmol/L (ref 134–144)
eGFR: 87 mL/min/{1.73_m2} (ref >59)

## 2021-03-15 LAB — CK TOTAL AND CKMB (NOT AT ARMC)
CK-MB Index: 1.5 ng/mL (ref 0.0–10.4)
Total CK: 106 U/L (ref 41–331)

## 2021-03-15 LAB — D-DIMER, QUANTITATIVE: D-DIMER: 0.24 mg/L FEU (ref 0.00–0.49)

## 2021-03-15 LAB — TROPONIN T: Troponin T (Highly Sensitive): <6 ng/L (ref 0–22)

## 2021-03-15 MED ORDER — DILTIAZEM HCL 30 MG PO TABS: ORAL_TABLET | ORAL | 2 refills | Status: DC

## 2021-03-25 NOTE — Progress Notes (Signed)
Pt aware.

## 2021-03-28 ENCOUNTER — Other Ambulatory Visit: Payer: Self-pay

## 2021-03-28 ENCOUNTER — Encounter: Payer: Self-pay | Admitting: Family Medicine

## 2021-03-28 ENCOUNTER — Ambulatory Visit: Payer: BC Managed Care – PPO | Admitting: Family Medicine

## 2021-03-28 VITALS — BP 132/73 | HR 74 | Ht 76.0 in | Wt 198.0 lb

## 2021-03-28 DIAGNOSIS — Z23 Encounter for immunization: Secondary | ICD-10-CM

## 2021-03-28 DIAGNOSIS — R0789 Other chest pain: Secondary | ICD-10-CM | POA: Diagnosis not present

## 2021-03-28 NOTE — Progress Notes (Signed)
BP 132/73   Pulse 74   Ht '6\' 4"'  (1.93 m)   Wt 198 lb (89.8 kg)   SpO2 97%   BMI 24.10 kg/m    Subjective:   Patient ID: Charles Rasmussen, male    DOB: 1970-10-12, 50 y.o.   MRN: [id number]  HPI: Charles Rasmussen is a 50 y.o. male presenting on 03/28/2021 for Chest Pain (Left. ? Pulled muscle)   HPI Patient is coming in for chest wall pain.  He says the pains been hurting for 54-monthand it hurts him off and on.  He says certain motions and movements with his arm or reaching behind him causing his chest wall to be sore and then coughing or sneezing also cause it to be sore.  He came in a couple weeks ago and was evaluated in our office and had extensive blood work panel including D-dimer and EKG which found no abnormalities.  He says he has not taking anything for it and says that it has not worsened or improved but is just still there.  He says the pain is mild but nagging.  Relevant past medical, surgical, family and social history reviewed and updated as indicated. Interim medical history since our last visit reviewed. Allergies and medications reviewed and updated.  Review of Systems  Constitutional:  Negative for chills and fever.  Respiratory:  Negative for cough, chest tightness, shortness of breath and wheezing.   Cardiovascular:  Positive for chest pain. Negative for palpitations and leg swelling.  Musculoskeletal:  Negative for back pain and gait problem.  Skin:  Negative for rash.  All other systems reviewed and are negative.  Per HPI unless specifically indicated above   Allergies as of 03/28/2021   No Known Allergies      Medication List        Accurate as of March 28, 2021 11:46 AM. If you have any questions, ask your nurse or doctor.          diazepam 2 MG tablet Commonly known as: Valium Take 1 tablet (2 mg total) by mouth as needed for anxiety.   diltiazem 30 MG tablet Commonly known as: Cardizem Take 1 tablet every 4 hours AS NEEDED for AFIB heart  rate >100 as long as blood pressure >100.   metoprolol tartrate 25 MG tablet Commonly known as: LOPRESSOR Take 1/2 (one-half) tablet by mouth twice daily.         Objective:   BP 132/73   Pulse 74   Ht '6\' 4"'  (1.93 m)   Wt 198 lb (89.8 kg)   SpO2 97%   BMI 24.10 kg/m   Wt Readings from Last 3 Encounters:  03/28/21 198 lb (89.8 kg)  03/14/21 196 lb (88.9 kg)  01/01/21 194 lb (88 kg)    Physical Exam Vitals and nursing note reviewed.  Constitutional:      General: He is not in acute distress.    Appearance: He is well-developed. He is not diaphoretic.  Cardiovascular:     Rate and Rhythm: Normal rate and regular rhythm.     Heart sounds: Normal heart sounds. No murmur heard. Pulmonary:     Effort: Pulmonary effort is normal. No respiratory distress.     Breath sounds: Normal breath sounds. No wheezing.  Chest:     Chest wall: Tenderness present.    Musculoskeletal:        General: Normal range of motion.  Skin:    General: Skin is warm and dry.  Findings: No rash.  Neurological:     Mental Status: He is alert and oriented to person, place, and time.     Coordination: Coordination normal.  Psychiatric:        Behavior: Behavior normal.    Results for orders placed or performed in visit on 03/14/21  CBC with Differential/Platelet  Result Value Ref Range   WBC 7.1 3.4 - 10.8 x10E3/uL   RBC 4.82 4.14 - 5.80 x10E6/uL   Hemoglobin 14.3 13.0 - 17.7 g/dL   Hematocrit 42.7 37.5 - 51.0 %   MCV 89 79 - 97 fL   MCH 29.7 26.6 - 33.0 pg   MCHC 33.5 31.5 - 35.7 g/dL   RDW 12.7 11.6 - 15.4 %   Platelets 292 150 - 450 x10E3/uL   Neutrophils 68 Not Estab. %   Lymphs 22 Not Estab. %   Monocytes 7 Not Estab. %   Eos 2 Not Estab. %   Basos 1 Not Estab. %   Neutrophils Absolute 4.8 1.4 - 7.0 x10E3/uL   Lymphocytes Absolute 1.6 0.7 - 3.1 x10E3/uL   Monocytes Absolute 0.5 0.1 - 0.9 x10E3/uL   EOS (ABSOLUTE) 0.1 0.0 - 0.4 x10E3/uL   Basophils Absolute 0.1 0.0 - 0.2  x10E3/uL   Immature Granulocytes 0 Not Estab. %   Immature Grans (Abs) 0.0 0.0 - 0.1 x10E3/uL  BMP8+EGFR  Result Value Ref Range   Glucose 84 65 - 99 mg/dL   BUN 12 6 - 24 mg/dL   Creatinine, Ser 1.03 0.76 - 1.27 mg/dL   eGFR 87 >59 mL/min/1.73   BUN/Creatinine Ratio 12 9 - 20   Sodium 137 134 - 144 mmol/L   Potassium 4.3 3.5 - 5.2 mmol/L   Chloride 101 96 - 106 mmol/L   CO2 25 20 - 29 mmol/L   Calcium 9.4 8.7 - 10.2 mg/dL  D-dimer, quantitative  Result Value Ref Range   D-DIMER 0.24 0.00 - 0.49 mg/L FEU  CK total and CKMB (cardiac)not at [facility]  Result Value Ref Range   Total CK 106 41 - 331 U/L   CK-MB Index 1.5 0.0 - 10.4 ng/mL  Troponin T  Result Value Ref Range   Troponin T (Highly Sensitive) <6 0 - 22 ng/L    Assessment & Plan:   Problem List Items Addressed This Visit   None Visit Diagnoses     Musculoskeletal chest pain    -  Primary   Relevant Orders   Ambulatory referral to Physical Therapy   Need for immunization against influenza       Relevant Orders   Flu Vaccine QUAD 33moIM (Fluarix, Fluzone & Alfiuria Quad PF) (Completed)       Will refer to physical therapy and recommended he do Aleve twice a day for 2 weeks. Follow up plan: Return if symptoms worsen or fail to improve.  Counseling provided for all of the vaccine components Orders Placed This Encounter  Procedures   Flu Vaccine QUAD 667moM (Fluarix, Fluzone & Alfiuria Quad PF)   Ambulatory referral to Physical Therapy    JoCaryl PinaMD [facility] 03/28/2021, 11:46 AM

## 2021-04-09 ENCOUNTER — Ambulatory Visit: Payer: BC Managed Care – PPO | Admitting: Physical Therapy

## 2021-09-12 ENCOUNTER — Telehealth: Payer: Self-pay | Admitting: Internal Medicine

## 2021-09-12 NOTE — Telephone Encounter (Signed)
Attempted phone call to pt and left voicemail message to contact RN at [phone]. 

## 2021-09-12 NOTE — Telephone Encounter (Signed)
Pt reaching out stating that he may have a stomach bug however he is due to take his metoprolol tonight and would like to know what he should do if he is not able to keep it down... please advise  ?

## 2021-11-06 ENCOUNTER — Other Ambulatory Visit: Payer: Self-pay | Admitting: Internal Medicine

## 2021-11-07 ENCOUNTER — Other Ambulatory Visit: Payer: Self-pay

## 2021-11-07 MED ORDER — DILTIAZEM HCL 30 MG PO TABS: ORAL_TABLET | ORAL | 0 refills | Status: DC

## 2021-11-07 MED ORDER — METOPROLOL TARTRATE 25 MG PO TABS
ORAL_TABLET | ORAL | 0 refills | Status: DC
Start: 2021-11-07 — End: 2022-01-29

## 2021-11-13 ENCOUNTER — Ambulatory Visit: Payer: BC Managed Care – PPO | Admitting: Psychology

## 2021-11-15 ENCOUNTER — Telehealth: Payer: Self-pay | Admitting: Emergency Medicine

## 2021-11-15 ENCOUNTER — Telehealth: Payer: Self-pay | Admitting: Internal Medicine

## 2021-11-15 NOTE — Telephone Encounter (Signed)
The patient is calling in complaining of lightheadedness and felt like his heart paused when this occurred. The patient was having dinner out with friends. He had worked all day, inside at the school, feeling normal throughout the day. Patient stated he ate and hydrated like normal with no problems. He was mid meal when this occurred, no alcohol involvement. He had the feeling that his blood pressure shot up with a lot of pressure in his head for about 6-7 seconds.  He felt normal right after no palpitations after or during. Kind of like he was frozen in time, stated he may have hit his chest a little bit. Current VS 136/79 76. He has not missed any doses of medication. Patient is due for appointment as well. Appt made for 01/15/22 at 10:45 am. ED precautions given. Will forward to MD for advisement.  ?Patient verbalized understanding and agreement.  ?

## 2021-11-15 NOTE — Telephone Encounter (Signed)
Patient returning calling.

## 2021-11-15 NOTE — Telephone Encounter (Signed)
FYI-  ?Patient called and stated that last night he had an episode with his heart where he believes it quit beating and he got really flushed and he said that he has has these episodes over the last couple months. I advised him to reach out to Cardiologist office- Dr Alberteen Spindle for further evaluation.  ? ?Jaiyden Laur. LPN   ?

## 2021-11-15 NOTE — Telephone Encounter (Signed)
Yes I agree, he needs to reach out to his cardiologist office and likely needs to wear a monitor. ?

## 2021-11-15 NOTE — Telephone Encounter (Signed)
Pt c/o Syncope: STAT if syncope occurred within 30 minutes and pt complains of lightheadedness ?High Priority if episode of passing out, completely, today or in last 24 hours  ? ?Did you pass out today? No   ? ?When is the last time you passed out? Pre-syncope a year ago  ? ?Has this occurred multiple times? No   ? ?Did you have any symptoms prior to passing out? Lightheadedness, felt like heart stopped for 6 or 7 sec.   ?

## 2021-11-15 NOTE — Telephone Encounter (Signed)
Left message to call back  

## 2021-12-12 ENCOUNTER — Other Ambulatory Visit: Payer: BC Managed Care – PPO

## 2021-12-12 DIAGNOSIS — Z0000 Encounter for general adult medical examination without abnormal findings: Secondary | ICD-10-CM

## 2021-12-13 LAB — CMP14+EGFR
ALT: 19 IU/L (ref 0–44)
AST: 24 IU/L (ref 0–40)
Albumin/Globulin Ratio: 1.9 (ref 1.2–2.2)
Albumin: 4.5 g/dL (ref 3.8–4.9)
Alkaline Phosphatase: 74 IU/L (ref 44–121)
BUN/Creatinine Ratio: 14 (ref 9–20)
BUN: 13 mg/dL (ref 6–24)
Bilirubin Total: 0.7 mg/dL (ref 0.0–1.2)
CO2: 22 mmol/L (ref 20–29)
Calcium: 9.6 mg/dL (ref 8.7–10.2)
Chloride: 102 mmol/L (ref 96–106)
Creatinine, Ser: 0.94 mg/dL (ref 0.76–1.27)
Globulin, Total: 2.4 g/dL (ref 1.5–4.5)
Glucose: 97 mg/dL (ref 70–99)
Potassium: 4.2 mmol/L (ref 3.5–5.2)
Sodium: 139 mmol/L (ref 134–144)
Total Protein: 6.9 g/dL (ref 6.0–8.5)
eGFR: 98 mL/min/{1.73_m2} (ref >59)

## 2021-12-13 LAB — CBC WITH DIFFERENTIAL/PLATELET
Basophils Absolute: 0.1 10*3/uL (ref 0.0–0.2)
Basos: 1 %
EOS (ABSOLUTE): 0.2 10*3/uL (ref 0.0–0.4)
Eos: 4 %
Hematocrit: 40.1 % (ref 37.5–51.0)
Hemoglobin: 14.2 g/dL (ref 13.0–17.7)
Immature Grans (Abs): 0 10*3/uL (ref 0.0–0.1)
Immature Granulocytes: 0 %
Lymphocytes Absolute: 1.4 10*3/uL (ref 0.7–3.1)
Lymphs: 27 %
MCH: 30.7 pg (ref 26.6–33.0)
MCHC: 35.4 g/dL (ref 31.5–35.7)
MCV: 87 fL (ref 79–97)
Monocytes Absolute: 0.5 10*3/uL (ref 0.1–0.9)
Monocytes: 9 %
Neutrophils Absolute: 3.1 10*3/uL (ref 1.4–7.0)
Neutrophils: 59 %
Platelets: 273 10*3/uL (ref 150–450)
RBC: 4.63 x10E6/uL (ref 4.14–5.80)
RDW: 12.5 % (ref 11.6–15.4)
WBC: 5.2 10*3/uL (ref 3.4–10.8)

## 2021-12-13 LAB — LIPID PANEL
Chol/HDL Ratio: 3.9 ratio (ref 0.0–5.0)
Cholesterol, Total: 208 mg/dL — ABNORMAL HIGH (ref 100–199)
HDL: 53 mg/dL (ref >39)
LDL Chol Calc (NIH): 135 mg/dL — ABNORMAL HIGH (ref 0–99)
Triglycerides: 110 mg/dL (ref 0–149)
VLDL Cholesterol Cal: 20 mg/dL (ref 5–40)

## 2021-12-16 ENCOUNTER — Ambulatory Visit (INDEPENDENT_AMBULATORY_CARE_PROVIDER_SITE_OTHER): Payer: BC Managed Care – PPO | Admitting: Family Medicine

## 2021-12-16 ENCOUNTER — Encounter: Payer: Self-pay | Admitting: Family Medicine

## 2021-12-16 VITALS — BP 127/73 | HR 88 | Temp 98.0°F | Ht 76.0 in | Wt 196.0 lb

## 2021-12-16 DIAGNOSIS — I480 Paroxysmal atrial fibrillation: Secondary | ICD-10-CM | POA: Diagnosis not present

## 2021-12-16 DIAGNOSIS — Z0001 Encounter for general adult medical examination with abnormal findings: Secondary | ICD-10-CM | POA: Diagnosis not present

## 2021-12-16 DIAGNOSIS — E782 Mixed hyperlipidemia: Secondary | ICD-10-CM

## 2021-12-16 DIAGNOSIS — I10 Essential (primary) hypertension: Secondary | ICD-10-CM

## 2021-12-16 DIAGNOSIS — Z0000 Encounter for general adult medical examination without abnormal findings: Secondary | ICD-10-CM

## 2021-12-16 DIAGNOSIS — Z23 Encounter for immunization: Secondary | ICD-10-CM | POA: Diagnosis not present

## 2021-12-16 NOTE — Progress Notes (Signed)
BP 127/73   Pulse 88   Temp 98 F (36.7 C)   Ht '6\' 4"'  (1.93 m)   Wt 196 lb (88.9 kg)   SpO2 98%   BMI 23.86 kg/m    Subjective:   Patient ID: Charles Rasmussen, male    DOB: November 07, 1970, 51 y.o.   MRN: [id number]  HPI: Charles Rasmussen is a 51 y.o. male presenting on 12/16/2021 for Medical Management of Chronic Issues (CPE)   HPI Physical exam Patient denies any chest pain, shortness of breath, headaches or vision issues, abdominal complaints, diarrhea, nausea, vomiting, or joint issues.   Afib Patient is coming in for recheck for A-fib.  He is currently on diltiazem and metoprolol.  Relevant past medical, surgical, family and social history reviewed and updated as indicated. Interim medical history since our last visit reviewed. Allergies and medications reviewed and updated.  Review of Systems  Constitutional:  Negative for chills and fever.  Eyes:  Negative for visual disturbance.  Respiratory:  Negative for shortness of breath and wheezing.   Cardiovascular:  Negative for chest pain and leg swelling.  Musculoskeletal:  Negative for back pain and gait problem.  Skin:  Negative for rash.  Neurological:  Negative for dizziness, weakness and headaches.  All other systems reviewed and are negative.  Per HPI unless specifically indicated above   Allergies as of 12/16/2021   No Known Allergies      Medication List        Accurate as of December 16, 2021 10:33 AM. If you have any questions, ask your nurse or doctor.          STOP taking these medications    diazepam 2 MG tablet Commonly known as: Valium Stopped by: Worthy Rancher, MD       TAKE these medications    diltiazem 30 MG tablet Commonly known as: Cardizem Take 1 tablet every 4 hours AS NEEDED for AFIB heart rate >100 as long as blood pressure >100.   metoprolol tartrate 25 MG tablet Commonly known as: LOPRESSOR Take 1/2 (one-half) tablet by mouth twice daily.         Objective:   BP 127/73    Pulse 88   Temp 98 F (36.7 C)   Ht '6\' 4"'  (1.93 m)   Wt 196 lb (88.9 kg)   SpO2 98%   BMI 23.86 kg/m   Wt Readings from Last 3 Encounters:  12/16/21 196 lb (88.9 kg)  03/28/21 198 lb (89.8 kg)  03/14/21 196 lb (88.9 kg)    Physical Exam Vitals and nursing note reviewed.  Constitutional:      General: He is not in acute distress.    Appearance: He is well-developed. He is not diaphoretic.  Eyes:     General: No scleral icterus.    Conjunctiva/sclera: Conjunctivae normal.  Neck:     Thyroid: No thyromegaly.  Cardiovascular:     Rate and Rhythm: Normal rate and regular rhythm.     Heart sounds: Normal heart sounds. No murmur heard. Pulmonary:     Effort: Pulmonary effort is normal. No respiratory distress.     Breath sounds: Normal breath sounds. No wheezing.  Musculoskeletal:        General: Normal range of motion.     Cervical back: Neck supple.  Lymphadenopathy:     Cervical: No cervical adenopathy.  Skin:    General: Skin is warm and dry.     Findings: No rash.  Neurological:  Mental Status: He is alert and oriented to person, place, and time.     Coordination: Coordination normal.  Psychiatric:        Behavior: Behavior normal.    Results for orders placed or performed in visit on 12/12/21  CBC with Differential/Platelet  Result Value Ref Range   WBC 5.2 3.4 - 10.8 x10E3/uL   RBC 4.63 4.14 - 5.80 x10E6/uL   Hemoglobin 14.2 13.0 - 17.7 g/dL   Hematocrit 40.1 37.5 - 51.0 %   MCV 87 79 - 97 fL   MCH 30.7 26.6 - 33.0 pg   MCHC 35.4 31.5 - 35.7 g/dL   RDW 12.5 11.6 - 15.4 %   Platelets 273 150 - 450 x10E3/uL   Neutrophils 59 Not Estab. %   Lymphs 27 Not Estab. %   Monocytes 9 Not Estab. %   Eos 4 Not Estab. %   Basos 1 Not Estab. %   Neutrophils Absolute 3.1 1.4 - 7.0 x10E3/uL   Lymphocytes Absolute 1.4 0.7 - 3.1 x10E3/uL   Monocytes Absolute 0.5 0.1 - 0.9 x10E3/uL   EOS (ABSOLUTE) 0.2 0.0 - 0.4 x10E3/uL   Basophils Absolute 0.1 0.0 - 0.2 x10E3/uL    Immature Granulocytes 0 Not Estab. %   Immature Grans (Abs) 0.0 0.0 - 0.1 x10E3/uL  CMP14+EGFR  Result Value Ref Range   Glucose 97 70 - 99 mg/dL   BUN 13 6 - 24 mg/dL   Creatinine, Ser 0.94 0.76 - 1.27 mg/dL   eGFR 98 >59 mL/min/1.73   BUN/Creatinine Ratio 14 9 - 20   Sodium 139 134 - 144 mmol/L   Potassium 4.2 3.5 - 5.2 mmol/L   Chloride 102 96 - 106 mmol/L   CO2 22 20 - 29 mmol/L   Calcium 9.6 8.7 - 10.2 mg/dL   Total Protein 6.9 6.0 - 8.5 g/dL   Albumin 4.5 3.8 - 4.9 g/dL   Globulin, Total 2.4 1.5 - 4.5 g/dL   Albumin/Globulin Ratio 1.9 1.2 - 2.2   Bilirubin Total 0.7 0.0 - 1.2 mg/dL   Alkaline Phosphatase 74 44 - 121 IU/L   AST 24 0 - 40 IU/L   ALT 19 0 - 44 IU/L  Lipid panel  Result Value Ref Range   Cholesterol, Total 208 (H) 100 - 199 mg/dL   Triglycerides 110 0 - 149 mg/dL   HDL 53 >39 mg/dL   VLDL Cholesterol Cal 20 5 - 40 mg/dL   LDL Chol Calc (NIH) 135 (H) 0 - 99 mg/dL   Chol/HDL Ratio 3.9 0.0 - 5.0 ratio    Assessment & Plan:   Problem List Items Addressed This Visit       Cardiovascular and Mediastinum   Hypertensive disorder   A-fib ([place])     Other   Hyperlipidemia   Other Visit Diagnoses     Physical exam    -  Primary   Need for Tdap vaccination       Relevant Orders   Tdap vaccine greater than or equal to 7yo IM (Completed)       Continue current medicine, blood work looks good except cholesterol is up, we discussed diet and lifestyle modification and will reevaluate in the future. The 10-year ASCVD risk score (Arnett DK, et al., 2019) is: 4.8%   Values used to calculate the score:     Age: 69 years     Sex: Male     Is Non-Hispanic African American: No     Diabetic: No  Tobacco smoker: No     Systolic Blood Pressure: 239 mmHg     Is BP treated: Yes     HDL Cholesterol: 53 mg/dL     Total Cholesterol: 208 mg/dL  Follow up plan: Return in about 6 months (around 06/17/2022), or if symptoms worsen or fail to improve, for Cholesterol  recheck.  Counseling provided for all of the vaccine components Orders Placed This Encounter  Procedures   Tdap vaccine greater than or equal to 76yo IM    Caryl Pina, MD [facility] 12/16/2021, 10:33 AM

## 2021-12-17 ENCOUNTER — Ambulatory Visit (INDEPENDENT_AMBULATORY_CARE_PROVIDER_SITE_OTHER): Payer: BC Managed Care – PPO | Admitting: Psychology

## 2021-12-17 DIAGNOSIS — F411 Generalized anxiety disorder: Secondary | ICD-10-CM | POA: Diagnosis not present

## 2021-12-17 NOTE — Progress Notes (Signed)
[facility] Counselor Initial Adult Exam  Name: Charles Rasmussen Date: 12/17/2021 MRN: [id number] DOB: [birth year] PCP: Dettinger, Fransisca Kaufmann, MD  Time Spent: 5:00  pm - 6:00 pm: 33 Minutes   Charles Rasmussen participated from office, via video, and consented to treatment. Therapist participated from home office. We met online due to [place] pandemic.   Guardian/Payee:  N/A    Paperwork requested: No   Reason for Visit /Presenting Problem: Relationship concerns/anxiety  Mental Status Exam: Appearance:   Casual     Behavior:  Appropriate  Motor:  Normal  Speech/Language:   Pressured  Affect:  Appropriate  Mood:  normal  Thought process:  normal  Thought content:    WNL  Sensory/Perceptual disturbances:    WNL  Orientation:  oriented to person, place, and situation  Attention:  Good  Concentration:  Good  Memory:  WNL  Fund of knowledge:   Good  Insight:    Good  Judgment:   Fair  Impulse Control:  Good     Reported Symptoms:  Anxiety/fears of relationships and medical condition  Risk Assessment: Danger to Self:  No Self-injurious Behavior: No Danger to Others: No Duty to Warn:no Physical Aggression / Violence:No  Access to Firearms a concern:  unknown Gang Involvement:No  Patient / guardian was educated about steps to take if suicide or homicide risk level increases between visits: n/a While future psychiatric events cannot be accurately predicted, the patient does not currently require acute inpatient psychiatric care and does not currently meet [facility] involuntary commitment criteria.  Substance Abuse History: Current substance abuse: No     Past Psychiatric History:   Previous psychological history is significant for anxiety and depression Outpatient Providers:Julie Whitt History of Psych Hospitalization: No  Psychological Testing:  N/A    Abuse History:  Victim of: No.,  N/A    Report needed: No. Victim of  Neglect:No. Perpetrator of  N/A   Witness / Exposure to Domestic Violence: No   Protective Services Involvement: No  Witness to [employer] Violence:  No   Family History:  Family History  Problem Relation Age of Onset   Colon cancer Father 11   Rectal cancer Father    Hypertension Father    Stroke Mother    Colon cancer Maternal Grandfather 77   Rectal cancer Paternal Grandfather    Heart attack Paternal Aunt    Pancreatic cancer Paternal Uncle     Living situation: the patient lives with their family  Sexual Orientation: Straight  Relationship Status: single  Name of spouse / other:N/A If a parent, number of children / ages:none  Support Systems: family  Financial Stress:  No   Income/Employment/Disability: Employment  Armed forces logistics/support/administrative officer: No   Educational History: Education: college graduate  Religion/Sprituality/World View: Christian  Any cultural differences that may affect / interfere with treatment:  not applicable   Recreation/Hobbies: movies, walking, astronomy  Stressors: Health problems   Other: relationship concerns    Strengths: Family and Church  Barriers:  relationship inexperience   Legal History: Pending legal issue / charges: The patient has no significant history of legal issues. History of legal issue / charges:  N/A  Medical History/Surgical History: reviewed Past Medical History:  Diagnosis Date   Anxiety    Atrial fibrillation ([place])    Hemorrhoid thrombosis    Mitral valve prolapse    Unspecified hemorrhoids without mention  of complication 2774   Venous reflux 01/02/12   deep right per Nelda Severe. Kellie Simmering, M.D.    Past Surgical History:  Procedure Laterality Date   COLONOSCOPY     MOUTH SURGERY     wisdom teeth    Medications: Current Outpatient Medications  Medication Sig Dispense Refill   diltiazem (CARDIZEM) 30 MG tablet Take 1 tablet every 4 hours AS NEEDED for AFIB heart rate >100 as long as blood pressure >100. 90 tablet  0   metoprolol tartrate (LOPRESSOR) 25 MG tablet Take 1/2 (one-half) tablet by mouth twice daily. 90 tablet 0   No current facility-administered medications for this visit.    Initial Session: Patient saw Marya Amsler in the past. Says he has a good childhood, but parents did not have a good relationship. He had some depression in the 11th grade after a break-up. His mother had some history of depression. In later Oct 07, 2022 he realized that he would get anxious before going out with women. He saw a counselor at that time and explored [place] with TA approach. Was in counseling for a year and it helped him to start to date. In his early forties, dated a girl for a year, which was his longest to date. He felt he could finally relate to the world as a adult. At 38, he was in a relationship and recognized some cardiac symptoms of being out of rhythm. At that time, he had been feeling emotionally strong, but was concerned it was related to be excited about the person he was dating. That is when he started to see Almyra Free. Turns out he was having AFIB. He told the woman he was dating that he could not continue in relationship.  States that he is still a virgin because he has fears about his cardiac condition. He subsequently reached out to the girl a few years later and she expressed some interest. This past Christmas, he went out a few times with a woman that was separated and had a good time with her. She is in her thirties and he is in his fifties. He fears he is leading her on because he isn't sure he can physically satisfy her. He is not seeing her at this time because she takes care of children on weekends and he lives with father in his 55's that he takes care of. They still communicate, but do not see each other. They talk every two-three days.  He is seeking counseling to help direct him in this relationship and in any further relationships. Wants to know when to reveal his virginity and heart condition. Suggested  that he use counseling to better and understand his anxiety associated with romantic relationships. He says he has an irrational fear that he will hurt someone emotionally.  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 Lives with his dad and brother (who is divorced). He never had a home of his own, but did spend one semester in [place].  No other medical conditions other than AFIB.  Minor alcohol until AFIB diagnosis and now no alcohol at all. Never any recreational drugs. He has 2 older brothers and one younger sister. Sister and other brother are local. Mother died of stroke in 10/06/16. Parents had separated for close to twenty years.  Was an elementary education and spanish major.  States he has never felt he needed someone to be happy, but thinks now he would like the companionship. He loves going to movies and some  astronomy. Enjoys walking and hiking. He is very close to father and it will be hard when he passes. He is currently 35. Will call office to schedule next appointment. Is thinking of scheduling in 1 month. Treatment Goals: Seeking guidance on current and possible future relationships. Wants to reduce his relationship anxiety and medical fears. Goal date is 12-23.     Diagnoses:  Generalized Anxiety  Plan of Care: Outpatient psychotherapy   Marcelina Morel, PhD

## 2022-01-15 ENCOUNTER — Ambulatory Visit: Payer: BC Managed Care – PPO | Admitting: Internal Medicine

## 2022-01-15 DIAGNOSIS — I493 Ventricular premature depolarization: Secondary | ICD-10-CM

## 2022-01-15 DIAGNOSIS — I480 Paroxysmal atrial fibrillation: Secondary | ICD-10-CM

## 2022-01-29 ENCOUNTER — Other Ambulatory Visit: Payer: Self-pay | Admitting: Internal Medicine

## 2022-02-14 ENCOUNTER — Telehealth: Payer: Self-pay | Admitting: Internal Medicine

## 2022-02-14 MED ORDER — DILTIAZEM HCL 30 MG PO TABS: ORAL_TABLET | ORAL | 0 refills | Status: DC

## 2022-02-14 MED ORDER — METOPROLOL TARTRATE 25 MG PO TABS: ORAL_TABLET | ORAL | 0 refills | Status: DC

## 2022-02-14 NOTE — Telephone Encounter (Signed)
Pt's medication was sent to pt's pharmacy as requested. Confirmation received.  °

## 2022-02-14 NOTE — Telephone Encounter (Signed)
*  STAT* If patient is at the pharmacy, call can be transferred to refill team.   1. Which medications need to be refilled? (please list name of each medication and dose if known)  Metoprolol and Diltiazem  2. Which pharmacy/location (including street and city if local pharmacy) is medication to be sent to?[facility] [place]  3. Do they need a 30 day or 90 day supply? Need enough until his appointment on 04-24-22- please call today- will be out after today

## 2022-04-24 ENCOUNTER — Encounter: Payer: Self-pay | Admitting: Internal Medicine

## 2022-04-24 ENCOUNTER — Ambulatory Visit: Payer: BC Managed Care – PPO | Attending: Internal Medicine | Admitting: Internal Medicine

## 2022-04-24 VITALS — BP 122/70 | HR 79 | Ht 76.0 in | Wt 193.4 lb

## 2022-04-24 DIAGNOSIS — I480 Paroxysmal atrial fibrillation: Secondary | ICD-10-CM

## 2022-04-24 DIAGNOSIS — I493 Ventricular premature depolarization: Secondary | ICD-10-CM | POA: Diagnosis not present

## 2022-04-24 MED ORDER — DILTIAZEM HCL 30 MG PO TABS: ORAL_TABLET | ORAL | 0 refills | Status: DC

## 2022-04-24 MED ORDER — METOPROLOL TARTRATE 25 MG PO TABS: ORAL_TABLET | ORAL | 3 refills | Status: DC

## 2022-04-24 NOTE — Patient Instructions (Signed)
Medication Instructions:  Your physician recommends that you continue on your current medications as directed. Please refer to the Current Medication list given to you today.  *If you need a refill on your cardiac medications before your next appointment, please call your pharmacy*   Lab Work: None ordered.  If you have labs (blood work) drawn today and your tests are completely normal, you will receive your results only by: MyChart Message (if you have MyChart) OR A paper copy in the mail If you have any lab test that is abnormal or we need to change your treatment, we will call you to review the results.   Testing/Procedures: None ordered.    Follow-Up: At [facility], you and your health needs are our priority.  As part of our continuing mission to provide you with exceptional heart care, we have created designated Provider Care Teams.  These Care Teams include your primary Cardiologist (physician) and Advanced Practice Providers (APPs -  Physician Assistants and Nurse Practitioners) who all work together to provide you with the care you need, when you need it.  We recommend signing up for the patient portal called "MyChart".  Sign up information is provided on this After Visit Summary.  MyChart is used to connect with patients for Virtual Visits (Telemedicine).  Patients are able to view lab/test results, encounter notes, upcoming appointments, etc.  Non-urgent messages can be sent to your provider as well.   To learn more about what you can do with MyChart, go to [email or web address].    Your next appointment:   12 months with Dr Klein  Important Information About Sugar       

## 2022-04-24 NOTE — Progress Notes (Signed)
.  sk     Patient Care Team: Dettinger, Fransisca Kaufmann, MD as PCP - General (Family Medicine) Larey Dresser, MD (Cardiology) Danella Sensing, MD (Dermatology) Sandrea Matte., MD (Ophthalmology)   HPI  Charles Rasmussen is a 51 y.o. male Seen in follow-up for palpitations which include and surprisingly PVCs atrial fibrillation requiring cardioversion most recently 09/03/16 ( AFIB CLINIC reports 2/21)     Intermittent palpitations.  Quite abrupt.  May be worse at the end of the week and after he has been on dates.  Thinks that increase metoprolol tartrate is associated with more lethargy.  In 2018 we have given a prescription for 3 different beta-blocker he ended up not doing these, but he ends up remaining on low-dose metoprolol tartrate and a little bit of diltiazem through the day.  Anxiety continues to be an issue; however, it has been complicated by the fact that his father has progressive dementia, is not oriented most of the time and he is working on trying to provide caregivers.  He thinks that he can control himself from having atrial fibrillation by Valsalva.  No chest pain or shortness of breath or edema      Thromboembolic risk factors ( neg) for a CHADSVASc Score of 0   Past Medical History:  Diagnosis Date   Anxiety    Atrial fibrillation ([place])    Hemorrhoid thrombosis    Mitral valve prolapse    Unspecified hemorrhoids without mention of complication 2505   Venous reflux 01/02/12   deep right per Nelda Severe. Kellie Simmering, M.D.    Past Surgical History:  Procedure Laterality Date   COLONOSCOPY     MOUTH SURGERY     wisdom teeth    Current Outpatient Medications  Medication Sig Dispense Refill   diltiazem (CARDIZEM) 30 MG tablet Take 1 tablet every 4 hours AS NEEDED for AFIB heart rate >100 as long as blood pressure >100. 90 tablet 0   metoprolol tartrate (LOPRESSOR) 25 MG tablet Take 1/2 (one-half) tablet by mouth twice daily. 90 tablet 0   No current  facility-administered medications for this visit.    No Known Allergies    Review of Systems negative except from HPI and PMH  Physical Exam BP 122/70   Pulse 79   Ht 6\' 4"  (1.93 m)   Wt 193 lb 6.4 oz (87.7 kg)   SpO2 98%   BMI 23.54 kg/m  Well developed and nourished in no acute distress HENT normal Neck supple with JVP-  flat   Clear Regular rate and rhythm, no murmurs or gallops Abd-soft with active BS No Clubbing cyanosis edema Skin-warm and dry A & Oriented  Grossly normal sensory and motor function  ECG sinus at 73 Interval 16/09/36 RSR prime   Assessment and  Plan  Palpitations-PVCs  Side effects ? BB  Atrial Fib paroxysmal  Anxiety   Palpitations persist.  Suspect he has PVCs as well as possibly PACs and nonsustained atrial tachycardia.  He remains on metoprolol tartrate and as needed diltiazem.  He was inclined towards trying the other beta-blockers.  We discussed the issues related to the heightened anxiety associated with the care of his father.  And the importance of taking care of his own health as he goes through the caregiving role.

## 2022-06-18 ENCOUNTER — Ambulatory Visit: Payer: BC Managed Care – PPO | Admitting: Family Medicine

## 2022-07-02 ENCOUNTER — Other Ambulatory Visit: Payer: Self-pay | Admitting: Internal Medicine

## 2022-07-24 ENCOUNTER — Ambulatory Visit: Payer: BC Managed Care – PPO | Admitting: Family Medicine

## 2022-07-24 ENCOUNTER — Encounter: Payer: Self-pay | Admitting: Family Medicine

## 2022-07-24 VITALS — BP 118/70 | HR 78 | Temp 98.4°F | Ht 76.0 in | Wt 188.0 lb

## 2022-07-24 DIAGNOSIS — E782 Mixed hyperlipidemia: Secondary | ICD-10-CM

## 2022-07-24 DIAGNOSIS — Z23 Encounter for immunization: Secondary | ICD-10-CM

## 2022-07-24 DIAGNOSIS — Z125 Encounter for screening for malignant neoplasm of prostate: Secondary | ICD-10-CM | POA: Diagnosis not present

## 2022-07-24 DIAGNOSIS — I480 Paroxysmal atrial fibrillation: Secondary | ICD-10-CM

## 2022-07-24 DIAGNOSIS — I10 Essential (primary) hypertension: Secondary | ICD-10-CM | POA: Diagnosis not present

## 2022-07-24 MED ORDER — DILTIAZEM HCL 30 MG PO TABS: ORAL_TABLET | ORAL | 3 refills | Status: DC

## 2022-07-24 NOTE — Progress Notes (Signed)
BP 118/70   Pulse 78   Temp 98.4 F (36.9 C)   Ht 6\' 4"  (1.93 m)   Wt 188 lb (85.3 kg)   SpO2 97%   BMI 22.88 kg/m    Subjective:   Patient ID: Charles Rasmussen, male    DOB: 23-Feb-1971, 52 y.o.   MRN: [id number]  HPI: Charles Rasmussen is a 52 y.o. male presenting on 07/24/2022 for Medical Management of Chronic Issues and Hypertension   HPI Hypertension Patient is currently on diltiazem and metoprolol, and their blood pressure today is 118/70. Patient denies any lightheadedness or dizziness. Patient denies headaches, blurred vision, chest pains, shortness of breath, or weakness. Denies any side effects from medication and is content with current medication.   Hyperlipidemia Patient is coming in for recheck of his hyperlipidemia. The patient is currently taking no medication currently. They deny any issues with myalgias or history of liver damage from it. They deny any focal numbness or weakness or chest pain.   A-fib recheck Patient sees cardiology for A-fib and takes diltiazem currently for A-fib and metoprolol.  He denies any chest pain or palpitations.  Relevant past medical, surgical, family and social history reviewed and updated as indicated. Interim medical history since our last visit reviewed. Allergies and medications reviewed and updated.  Review of Systems  Constitutional:  Negative for chills and fever.  Eyes:  Negative for pain and discharge.  Respiratory:  Negative for cough, shortness of breath and wheezing.   Cardiovascular:  Negative for chest pain, palpitations and leg swelling.  Musculoskeletal:  Negative for back pain, gait problem and myalgias.  Skin:  Negative for rash.  Neurological:  Negative for dizziness, weakness and headaches.  Psychiatric/Behavioral:  Negative for suicidal ideas.   All other systems reviewed and are negative.   Per HPI unless specifically indicated above   Allergies as of 07/24/2022   No Known Allergies      Medication List         Accurate as of July 24, 2022  2:46 PM. If you have any questions, ask your nurse or doctor.          diltiazem 30 MG tablet Commonly known as: CARDIZEM TAKE 1 TABLET BY MOUTH EVERY 4 HOURS AS NEEDED FOR AFIB HEART RATE > 100, AS LONG AS BLOOD PRESSURE IS >100   metoprolol tartrate 25 MG tablet Commonly known as: LOPRESSOR Take 1/2 (one-half) tablet by mouth twice daily.         Objective:   BP 118/70   Pulse 78   Temp 98.4 F (36.9 C)   Ht 6\' 4"  (1.93 m)   Wt 188 lb (85.3 kg)   SpO2 97%   BMI 22.88 kg/m   Wt Readings from Last 3 Encounters:  07/24/22 188 lb (85.3 kg)  04/24/22 193 lb 6.4 oz (87.7 kg)  12/16/21 196 lb (88.9 kg)    Physical Exam Vitals and nursing note reviewed.  Constitutional:      General: He is not in acute distress.    Appearance: He is well-developed. He is not diaphoretic.  Eyes:     General: No scleral icterus.    Conjunctiva/sclera: Conjunctivae normal.  Neck:     Thyroid: No thyromegaly.  Cardiovascular:     Rate and Rhythm: Normal rate and regular rhythm.     Heart sounds: Normal heart sounds. No murmur heard. Pulmonary:     Effort: Pulmonary effort is normal. No respiratory distress.  Breath sounds: Normal breath sounds. No wheezing.  Musculoskeletal:        General: No swelling. Normal range of motion.     Cervical back: Neck supple.  Lymphadenopathy:     Cervical: No cervical adenopathy.  Skin:    General: Skin is warm and dry.     Findings: No rash.  Neurological:     Mental Status: He is alert and oriented to person, place, and time.     Coordination: Coordination normal.  Psychiatric:        Behavior: Behavior normal.       Assessment & Plan:   Problem List Items Addressed This Visit       Cardiovascular and Mediastinum   Hypertensive disorder   Relevant Medications   diltiazem (CARDIZEM) 30 MG tablet   Other Relevant Orders   CBC with Differential/Platelet   A-fib (HCC)   Relevant  Medications   diltiazem (CARDIZEM) 30 MG tablet   Other Relevant Orders   CBC with Differential/Platelet     Other   Hyperlipidemia - Primary   Relevant Medications   diltiazem (CARDIZEM) 30 MG tablet   Other Relevant Orders   CMP14+EGFR   Lipid panel   Other Visit Diagnoses     Prostate cancer screening       Relevant Orders   PSA, total and free       Continue current medicine, seems to be doing well, will do blood work.  No changes Follow up plan: Return in about 6 months (around 01/22/2023), or if symptoms worsen or fail to improve, for Physical exam and hypertension and cholesterol.  Counseling provided for all of the vaccine components Orders Placed This Encounter  Procedures   CBC with Differential/Platelet   CMP14+EGFR   Lipid panel   PSA, total and free    Caryl Pina, MD [facility] 07/24/2022, 2:46 PM

## 2022-07-24 NOTE — Patient Instructions (Signed)
Don't forget to schedule your nurse visit for your first Shingles vaccine.

## 2022-07-25 LAB — PSA, TOTAL AND FREE
PSA, Free Pct: 18.5 %
PSA, Free: 0.48 ng/mL
Prostate Specific Ag, Serum: 2.6 ng/mL (ref 0.0–4.0)

## 2022-07-25 LAB — CBC WITH DIFFERENTIAL/PLATELET
Basophils Absolute: 0.1 10*3/uL (ref 0.0–0.2)
Basos: 1 %
EOS (ABSOLUTE): 0.2 10*3/uL (ref 0.0–0.4)
Eos: 3 %
Hematocrit: 43 % (ref 37.5–51.0)
Hemoglobin: 14.3 g/dL (ref 13.0–17.7)
Immature Grans (Abs): 0 10*3/uL (ref 0.0–0.1)
Immature Granulocytes: 0 %
Lymphocytes Absolute: 1.8 10*3/uL (ref 0.7–3.1)
Lymphs: 29 %
MCH: 29.9 pg (ref 26.6–33.0)
MCHC: 33.3 g/dL (ref 31.5–35.7)
MCV: 90 fL (ref 79–97)
Monocytes Absolute: 0.5 10*3/uL (ref 0.1–0.9)
Monocytes: 9 %
Neutrophils Absolute: 3.8 10*3/uL (ref 1.4–7.0)
Neutrophils: 58 %
Platelets: 343 10*3/uL (ref 150–450)
RBC: 4.79 x10E6/uL (ref 4.14–5.80)
RDW: 12.6 % (ref 11.6–15.4)
WBC: 6.4 10*3/uL (ref 3.4–10.8)

## 2022-07-25 LAB — CMP14+EGFR
ALT: 18 IU/L (ref 0–44)
AST: 17 IU/L (ref 0–40)
Albumin/Globulin Ratio: 1.5 (ref 1.2–2.2)
Albumin: 4.3 g/dL (ref 3.8–4.9)
Alkaline Phosphatase: 75 IU/L (ref 44–121)
BUN/Creatinine Ratio: 15 (ref 9–20)
BUN: 16 mg/dL (ref 6–24)
Bilirubin Total: 0.6 mg/dL (ref 0.0–1.2)
CO2: 24 mmol/L (ref 20–29)
Calcium: 9.6 mg/dL (ref 8.7–10.2)
Chloride: 104 mmol/L (ref 96–106)
Creatinine, Ser: 1.07 mg/dL (ref 0.76–1.27)
Globulin, Total: 2.9 g/dL (ref 1.5–4.5)
Glucose: 71 mg/dL (ref 70–99)
Potassium: 4.3 mmol/L (ref 3.5–5.2)
Sodium: 138 mmol/L (ref 134–144)
Total Protein: 7.2 g/dL (ref 6.0–8.5)
eGFR: 83 mL/min/{1.73_m2} (ref >59)

## 2022-07-25 LAB — LIPID PANEL
Chol/HDL Ratio: 3.7 ratio (ref 0.0–5.0)
Cholesterol, Total: 224 mg/dL — ABNORMAL HIGH (ref 100–199)
HDL: 60 mg/dL (ref >39)
LDL Chol Calc (NIH): 144 mg/dL — ABNORMAL HIGH (ref 0–99)
Triglycerides: 114 mg/dL (ref 0–149)
VLDL Cholesterol Cal: 20 mg/dL (ref 5–40)

## 2022-07-28 ENCOUNTER — Ambulatory Visit: Payer: BC Managed Care – PPO

## 2022-08-13 ENCOUNTER — Telehealth (INDEPENDENT_AMBULATORY_CARE_PROVIDER_SITE_OTHER): Payer: BC Managed Care – PPO | Admitting: Family Medicine

## 2022-08-13 ENCOUNTER — Encounter: Payer: Self-pay | Admitting: Family Medicine

## 2022-08-13 DIAGNOSIS — E782 Mixed hyperlipidemia: Secondary | ICD-10-CM | POA: Diagnosis not present

## 2022-08-13 MED ORDER — ROSUVASTATIN CALCIUM 5 MG PO TABS: 5.0000 mg | ORAL_TABLET | Freq: Every evening | ORAL | 3 refills | Status: DC | PRN

## 2022-08-13 NOTE — Progress Notes (Signed)
Virtual Visit via telephone Note  I connected with Charles Rasmussen on 08/13/22 at 1618 by telephone and verified that I am speaking with the correct person using two identifiers. Charles Rasmussen is currently located at home and patient are currently with her during visit. The provider, Fransisca Kaufmann Addeline Calarco, MD is located in their office at time of visit.  Call ended at 1632  I discussed the limitations, risks, security and privacy concerns of performing an evaluation and management service by telephone and the availability of in person appointments. I also discussed with the patient that there may be a patient responsible charge related to this service. The patient expressed understanding and agreed to proceed.   History and Present Illness: Hyperlipidemia Patient is coming in for recheck of his hyperlipidemia. The patient is currently taking crestor. They deny any issues with myalgias or history of liver damage from it. They deny any focal numbness or weakness or chest pain.  The 10-year ASCVD risk score (Arnett DK, et al., 2019) is: 4.6%   Values used to calculate the score:     Age: 52 years     Sex: Male     Is Non-Hispanic African American: No     Diabetic: No     Tobacco smoker: No     Systolic Blood Pressure: 244 mmHg     Is BP treated: Yes     HDL Cholesterol: 60 mg/dL     Total Cholesterol: 224 mg/dL   1. Mixed hyperlipidemia     Outpatient Encounter Medications as of 08/13/2022  Medication Sig   rosuvastatin (CRESTOR) 5 MG tablet Take 1 tablet (5 mg total) by mouth at bedtime as needed.   diltiazem (CARDIZEM) 30 MG tablet TAKE 1 TABLET BY MOUTH EVERY 4 HOURS AS NEEDED FOR AFIB HEART RATE > 100, AS LONG AS BLOOD PRESSURE IS >100   metoprolol tartrate (LOPRESSOR) 25 MG tablet Take 1/2 (one-half) tablet by mouth twice daily.   No facility-administered encounter medications on file as of 08/13/2022.    Review of Systems  Constitutional:  Negative for chills and fever.   Respiratory:  Negative for shortness of breath and wheezing.   Cardiovascular:  Negative for chest pain and leg swelling.  Musculoskeletal:  Negative for back pain and gait problem.  Skin:  Negative for rash.  Neurological:  Negative for dizziness, weakness and numbness.  All other systems reviewed and are negative.   Observations/Objective: Patient sounds comfortable and in no acute distress  Assessment and Plan: Problem List Items Addressed This Visit       Other   Hyperlipidemia - Primary   Relevant Medications   rosuvastatin (CRESTOR) 5 MG tablet     Follow up plan: Return if symptoms worsen or fail to improve.     I discussed the assessment and treatment plan with the patient. The patient was provided an opportunity to ask questions and all were answered. The patient agreed with the plan and demonstrated an understanding of the instructions.   The patient was advised to call back or seek an in-person evaluation if the symptoms worsen or if the condition fails to improve as anticipated.  The above assessment and management plan was discussed with the patient. The patient verbalized understanding of and has agreed to the management plan. Patient is aware to call the clinic if symptoms persist or worsen. Patient is aware when to return to the clinic for a follow-up visit. Patient educated on when it is appropriate to  go to the emergency department.    I provided 14 minutes of non-face-to-face time during this encounter.    Worthy Rancher, MD

## 2022-08-21 ENCOUNTER — Encounter (HOSPITAL_COMMUNITY): Payer: Self-pay | Admitting: *Deleted

## 2022-09-17 ENCOUNTER — Telehealth: Payer: Self-pay | Admitting: Family Medicine

## 2022-09-17 NOTE — Telephone Encounter (Signed)
Left message making pt aware and to call back if needed.

## 2022-09-17 NOTE — Telephone Encounter (Signed)
Have him try taking it every other day at first and let me know how it goes by taking it every other day.

## 2022-11-05 ENCOUNTER — Telehealth: Payer: Self-pay | Admitting: Family Medicine

## 2022-11-05 NOTE — Telephone Encounter (Signed)
Dettinger, please review if pt needs an appt

## 2022-11-05 NOTE — Telephone Encounter (Signed)
Appt scheduled for 11:10 on 4/25. Pt aware.

## 2022-11-05 NOTE — Telephone Encounter (Signed)
Yes please have him make an appointment

## 2022-11-05 NOTE — Telephone Encounter (Signed)
Patient said that a week and a half ago he removed a tick that had been on him for around a week. He is concerned about diseases and thought he may need an antibiotic. Made him aware that he would need an appointment to get medication, he did not want to make an appointment at this time and wanted to send a message back to PCP to get his opinion on this first. Please call back and advise.

## 2022-11-06 ENCOUNTER — Encounter: Payer: Self-pay | Admitting: Family Medicine

## 2022-11-06 ENCOUNTER — Ambulatory Visit: Payer: BC Managed Care – PPO | Admitting: Family Medicine

## 2022-11-06 VITALS — BP 110/66 | HR 62 | Ht 76.0 in | Wt 195.0 lb

## 2022-11-06 DIAGNOSIS — W57XXXA Bitten or stung by nonvenomous insect and other nonvenomous arthropods, initial encounter: Secondary | ICD-10-CM | POA: Diagnosis not present

## 2022-11-06 DIAGNOSIS — S70362A Insect bite (nonvenomous), left thigh, initial encounter: Secondary | ICD-10-CM

## 2022-11-06 NOTE — Addendum Note (Signed)
Addended by: Dorene Sorrow on: 11/06/2022 11:46 AM   Modules accepted: Orders

## 2022-11-06 NOTE — Progress Notes (Signed)
BP 110/66   Pulse 62   Ht  (1.93 m)   Wt 195 lb (88.5 kg)   SpO2 98%   BMI 23.74 kg/m    Subjective:   Patient ID: Charles Rasmussen, male    DOB: 03-Mar-1971, 52 y.o.   MRN: [id number]  HPI: Charles Rasmussen is a 52 y.o. male presenting on 11/06/2022 for Tick Removal (Left thigh. Removed tick weeks ago. No symptoms just concerned about Lyme's disease)   HPI Patient is coming in for possible tick bite on his left upper inner leg.  He says he has gotten some other bites including mites and other things on his waistline and arm and abdomen but they look very classic like that but then he had this smaller bite on his left upper inner leg with a black center that he was concerned about.  He did not know if it was a tick bite for sure, he did not necessarily know if there was a tick that he removed, he did scratch at it and thinks he may have scratched the back of the tick but does not know for sure.  He denies any redness or warmth or swelling or rash.  He denies any body aches or fevers or chills.  He found this bite about 3 weeks ago.  Relevant past medical, surgical, family and social history reviewed and updated as indicated. Interim medical history since our last visit reviewed. Allergies and medications reviewed and updated.  Review of Systems  Constitutional:  Negative for chills, fatigue and fever.  Eyes:  Negative for discharge.  Respiratory:  Negative for shortness of breath and wheezing.   Cardiovascular:  Negative for chest pain and leg swelling.  Musculoskeletal:  Negative for arthralgias, back pain, gait problem and myalgias.  Skin:  Negative for rash.  Neurological:  Negative for weakness.  All other systems reviewed and are negative.   Per HPI unless specifically indicated above   Allergies as of 11/06/2022   No Known Allergies      Medication List        Accurate as of November 06, 2022 11:41 AM. If you have any questions, ask your nurse or doctor.           diltiazem 30 MG tablet Commonly known as: CARDIZEM TAKE 1 TABLET BY MOUTH EVERY 4 HOURS AS NEEDED FOR AFIB HEART RATE > 100, AS LONG AS BLOOD PRESSURE IS >100   metoprolol tartrate 25 MG tablet Commonly known as: LOPRESSOR Take 1/2 (one-half) tablet by mouth twice daily.   rosuvastatin 5 MG tablet Commonly known as: Crestor Take 1 tablet (5 mg total) by mouth at bedtime as needed.         Objective:   BP 110/66   Pulse 62   Ht  (1.93 m)   Wt 195 lb (88.5 kg)   SpO2 98%   BMI 23.74 kg/m   Wt Readings from Last 3 Encounters:  11/06/22 195 lb (88.5 kg)  07/24/22 188 lb (85.3 kg)  04/24/22 193 lb 6.4 oz (87.7 kg)    Physical Exam Vitals and nursing note reviewed.  Constitutional:      Appearance: Normal appearance.  Skin:    General: Skin is warm.     Findings: Lesion (Small pink papule with black eschar in the center, no surrounding erythema or induration) present. No bruising or rash.  Neurological:     Mental Status: He is alert.       Assessment &  Plan:   Problem List Items Addressed This Visit   None Visit Diagnoses     Tick bite of left thigh, initial encounter    -  Primary   Relevant Orders   Alpha-Gal Panel   Rocky mtn spotted fvr abs pnl(IgG+IgM)   Lyme Disease Serology w/Reflex       No symptoms of Lyme disease or recommended spotted fever today.  Since it was about 3 weeks ago, we will go ahead and do the testing. Follow up plan: Return if symptoms worsen or fail to improve.  Counseling provided for all of the vaccine components Orders Placed This Encounter  Procedures   Alpha-Gal Panel   Rocky mtn spotted fvr abs pnl(IgG+IgM)   Lyme Disease Serology w/Reflex    Arville Care, MD [facility] 11/06/2022, 11:41 AM

## 2022-11-12 LAB — ALPHA-GAL PANEL
Allergen Lamb IgE: <0.1 kU/L
Beef IgE: <0.1 kU/L
IgE (Immunoglobulin E), Serum: 62 IU/mL (ref 6–495)
O215-IgE Alpha-Gal: <0.1 kU/L
Pork IgE: <0.1 kU/L

## 2022-11-12 LAB — LYME DISEASE SEROLOGY W/REFLEX: Lyme Total Antibody EIA: NEGATIVE

## 2022-11-12 LAB — SPOTTED FEVER GROUP ANTIBODIES
Spotted Fever Group IgG: 1:256 {titer} — ABNORMAL HIGH
Spotted Fever Group IgM: <1:64 {titer}

## 2022-11-17 ENCOUNTER — Other Ambulatory Visit: Payer: Self-pay | Admitting: Family Medicine

## 2022-11-17 MED ORDER — DOXYCYCLINE HYCLATE 100 MG PO TABS: 100.0000 mg | ORAL_TABLET | Freq: Two times a day (BID) | ORAL | 0 refills | Status: DC

## 2022-12-01 ENCOUNTER — Encounter: Payer: Self-pay | Admitting: Family Medicine

## 2022-12-01 ENCOUNTER — Ambulatory Visit (INDEPENDENT_AMBULATORY_CARE_PROVIDER_SITE_OTHER): Payer: BC Managed Care – PPO | Admitting: Family Medicine

## 2022-12-01 VITALS — BP 106/64 | HR 77 | Temp 97.7°F | Ht 76.0 in | Wt 194.8 lb

## 2022-12-01 DIAGNOSIS — M25562 Pain in left knee: Secondary | ICD-10-CM

## 2022-12-01 DIAGNOSIS — I10 Essential (primary) hypertension: Secondary | ICD-10-CM | POA: Diagnosis not present

## 2022-12-01 DIAGNOSIS — Z0000 Encounter for general adult medical examination without abnormal findings: Secondary | ICD-10-CM

## 2022-12-01 DIAGNOSIS — Z0001 Encounter for general adult medical examination with abnormal findings: Secondary | ICD-10-CM | POA: Diagnosis not present

## 2022-12-01 DIAGNOSIS — I480 Paroxysmal atrial fibrillation: Secondary | ICD-10-CM | POA: Diagnosis not present

## 2022-12-01 DIAGNOSIS — E782 Mixed hyperlipidemia: Secondary | ICD-10-CM

## 2022-12-01 DIAGNOSIS — Z23 Encounter for immunization: Secondary | ICD-10-CM | POA: Diagnosis not present

## 2022-12-01 DIAGNOSIS — G8929 Other chronic pain: Secondary | ICD-10-CM

## 2022-12-01 NOTE — Addendum Note (Signed)
Addended by: Adella Hare B on: 12/01/2022 02:05 PM   Modules accepted: Orders

## 2022-12-01 NOTE — Progress Notes (Signed)
BP 106/64   Pulse 77   Temp 97.7 F (36.5 C)   Ht 6\' 4"  (1.93 m)   Wt 194 lb 12.8 oz (88.4 kg)   SpO2 98%   BMI 23.71 kg/m    Subjective:   Patient ID: Charles Rasmussen, male    DOB: 01/21/1971, 52 y.o.   MRN: [id number]  HPI: Charles Rasmussen is a 52 y.o. male presenting on 12/01/2022 for Annual Exam   HPI Physical exam Patient denies any chest pain, shortness of breath, headaches or vision issues, abdominal complaints, diarrhea, nausea, vomiting.  His only biggest complaint today is left knee discomfort that he said has had on and off since college but has been more bothersome and more consistent over the past few months.  He says it used to to come and go but now it comes most days.  He says he will feel something moving around in there and sometimes pop.  He denies any giving way or weakness.  He says it is not severe pain but more of a mild discomfort.  He feels like there is something loose in there that tweaks it every now and then especially when he is playing Frisbee golf so he stopped playing.  Hypertension and A-fib recheck Patient is currently on diltiazem and metoprolol, and their blood pressure today is 106/64. Patient denies any lightheadedness or dizziness. Patient denies headaches, blurred vision, chest pains, shortness of breath, or weakness. Denies any side effects from medication and is content with current medication.   Hyperlipidemia Patient is coming in for recheck of his hyperlipidemia. The patient is currently taking Crestor. They deny any issues with myalgias or history of liver damage from it. They deny any focal numbness or weakness or chest pain.   Relevant past medical, surgical, family and social history reviewed and updated as indicated. Interim medical history since our last visit reviewed. Allergies and medications reviewed and updated.  Review of Systems  Constitutional:  Negative for chills and fever.  HENT:  Negative for ear pain and tinnitus.   Eyes:   Negative for pain and visual disturbance.  Respiratory:  Negative for cough, shortness of breath and wheezing.   Cardiovascular:  Negative for chest pain, palpitations and leg swelling.  Gastrointestinal:  Negative for abdominal pain, blood in stool, constipation and diarrhea.  Genitourinary:  Negative for dysuria and hematuria.  Musculoskeletal:  Positive for arthralgias. Negative for back pain, gait problem, joint swelling and myalgias.  Skin:  Negative for rash.  Neurological:  Negative for dizziness, weakness, light-headedness and headaches.  Psychiatric/Behavioral:  Negative for suicidal ideas.   All other systems reviewed and are negative.   Per HPI unless specifically indicated above   Allergies as of 12/01/2022   No Known Allergies      Medication List        Accurate as of Dec 01, 2022 11:21 AM. If you have any questions, ask your nurse or doctor.          diltiazem 30 MG tablet Commonly known as: CARDIZEM TAKE 1 TABLET BY MOUTH EVERY 4 HOURS AS NEEDED FOR AFIB HEART RATE > 100, AS LONG AS BLOOD PRESSURE IS >100   doxycycline 100 MG tablet Commonly known as: VIBRA-TABS Take 1 tablet (100 mg total) by mouth 2 (two) times daily. 1 po bid   metoprolol tartrate 25 MG tablet Commonly known as: LOPRESSOR Take 1/2 (one-half) tablet by mouth twice daily.   rosuvastatin 5 MG tablet Commonly known as:  Crestor Take 1 tablet (5 mg total) by mouth at bedtime as needed.         Objective:   BP 106/64   Pulse 77   Temp 97.7 F (36.5 C)   Ht 6\' 4"  (1.93 m)   Wt 194 lb 12.8 oz (88.4 kg)   SpO2 98%   BMI 23.71 kg/m   Wt Readings from Last 3 Encounters:  12/01/22 194 lb 12.8 oz (88.4 kg)  11/06/22 195 lb (88.5 kg)  07/24/22 188 lb (85.3 kg)    Physical Exam Vitals reviewed.  Constitutional:      General: He is not in acute distress.    Appearance: He is well-developed. He is not diaphoretic.  HENT:     Right Ear: External ear normal.     Left Ear:  External ear normal.     Nose: Nose normal.     Mouth/Throat:     Pharynx: No oropharyngeal exudate.  Eyes:     General: No scleral icterus.       Right eye: No discharge.     Conjunctiva/sclera: Conjunctivae normal.     Pupils: Pupils are equal, round, and reactive to light.  Neck:     Thyroid: No thyromegaly.  Cardiovascular:     Rate and Rhythm: Normal rate and regular rhythm.     Heart sounds: Normal heart sounds. No murmur heard. Pulmonary:     Effort: Pulmonary effort is normal. No respiratory distress.     Breath sounds: Normal breath sounds. No wheezing.  Abdominal:     General: Bowel sounds are normal. There is no distension.     Palpations: Abdomen is soft.     Tenderness: There is no abdominal tenderness. There is no guarding or rebound.  Musculoskeletal:        General: Normal range of motion.     Cervical back: Neck supple.     Right knee: No swelling. Normal range of motion. No tenderness. No LCL laxity, MCL laxity or ACL laxity. Normal meniscus.     Left knee: No swelling or erythema. Normal range of motion. No tenderness. No LCL laxity, MCL laxity or ACL laxity.Normal meniscus.  Lymphadenopathy:     Cervical: No cervical adenopathy.  Skin:    General: Skin is warm and dry.     Findings: No rash.  Neurological:     Mental Status: He is alert and oriented to person, place, and time.     Coordination: Coordination normal.  Psychiatric:        Behavior: Behavior normal.       Assessment & Plan:   Problem List Items Addressed This Visit       Cardiovascular and Mediastinum   Hypertensive disorder   A-fib (HCC)     Other   Hyperlipidemia   Relevant Orders   CBC with Differential/Platelet   CMP14+EGFR   Lipid panel   Other Visit Diagnoses     Physical exam    -  Primary   Relevant Orders   CBC with Differential/Platelet   CMP14+EGFR   Lipid panel   Chronic pain of left knee       Relevant Orders   Ambulatory referral to Physical Therapy        Based on exam, did not see any abnormalities in the left knee today but based on his history, it sounds like possible meniscal injury, will do physical therapy  Patient has A-fib but not on anticoagulation because of low Vasc score Follow up  plan: Return in about 6 months (around 06/03/2023), or if symptoms worsen or fail to improve, for Hyperlipidemia and hypertension and A-fib recheck.  Counseling provided for all of the vaccine components Orders Placed This Encounter  Procedures   CBC with Differential/Platelet   CMP14+EGFR   Lipid panel   Ambulatory referral to Physical Therapy    Arville Care, MD [facility] 12/01/2022, 11:21 AM

## 2022-12-05 ENCOUNTER — Telehealth: Payer: Self-pay | Admitting: Family Medicine

## 2022-12-05 NOTE — Telephone Encounter (Signed)
Most the time it would not be needed unless he develops any new symptoms.  Let us know if he develops any new symptoms after this new tick bite.

## 2022-12-05 NOTE — Telephone Encounter (Signed)
Pt made aware and understood. He denies any symptoms at this time. He will call back if he does.

## 2022-12-16 ENCOUNTER — Ambulatory Visit: Payer: BC Managed Care – PPO | Admitting: Physical Therapy

## 2022-12-21 ENCOUNTER — Encounter: Payer: Self-pay | Admitting: Family Medicine

## 2023-04-22 ENCOUNTER — Telehealth: Payer: Self-pay | Admitting: Internal Medicine

## 2023-04-22 MED ORDER — DILTIAZEM HCL 30 MG PO TABS: ORAL_TABLET | ORAL | 0 refills | Status: DC

## 2023-04-22 MED ORDER — METOPROLOL TARTRATE 25 MG PO TABS: ORAL_TABLET | ORAL | 0 refills | Status: DC

## 2023-04-22 NOTE — Telephone Encounter (Signed)
*  STAT* If patient is at the pharmacy, call can be transferred to refill team.   1. Which medications need to be refilled? (please list name of each medication and dose if known) diltiazem (CARDIZEM) 30 MG tablet metoprolol tartrate (LOPRESSOR) 25 MG tablet  2. Which pharmacy/location (including street and city if local pharmacy) is medication to be sent to? [facility] [address], [place] - [address]   3. Do they need a 30 day or 90 day supply?  90 day supply

## 2023-04-22 NOTE — Telephone Encounter (Signed)
Pt's medications were sent to pt's pharmacy as requested. Confirmation received.  

## 2023-06-22 ENCOUNTER — Telehealth: Payer: Self-pay | Admitting: Family Medicine

## 2023-06-22 NOTE — Telephone Encounter (Signed)
Appt scheduled 12/18 at 3:50pm.

## 2023-06-22 NOTE — Telephone Encounter (Signed)
Copied from CRM [id number]. Topic: Clinical - Medical Advice >> Jun 22, 2023 12:01 PM Tiffany H wrote: Reason for CRM: Patient called to advise that while doing light exercise, he might've hurt his Achilles tendons in both legs. He advises that there's soreness in both legs but there is moderate pain in the left leg. This has gone on for three weeks. Pain isn't getting worse but it isn't improving.   Patient would like some advice. He would like to know if he should see a Sports Medicine doctor or come in early for an appointment - patient is currently scheduled to see Dr. Louanne Skye 07/16/23. Please assist.

## 2023-06-24 ENCOUNTER — Ambulatory Visit: Payer: BC Managed Care – PPO | Admitting: Family Medicine

## 2023-06-24 ENCOUNTER — Other Ambulatory Visit: Payer: Self-pay

## 2023-06-24 ENCOUNTER — Encounter: Payer: Self-pay | Admitting: Family Medicine

## 2023-06-24 VITALS — BP 102/66 | HR 76 | Ht 76.0 in | Wt 201.0 lb

## 2023-06-24 DIAGNOSIS — M6528 Calcific tendinitis, other site: Secondary | ICD-10-CM | POA: Diagnosis not present

## 2023-06-24 DIAGNOSIS — M25571 Pain in right ankle and joints of right foot: Secondary | ICD-10-CM

## 2023-06-24 DIAGNOSIS — M25572 Pain in left ankle and joints of left foot: Secondary | ICD-10-CM

## 2023-06-24 DIAGNOSIS — G8929 Other chronic pain: Secondary | ICD-10-CM | POA: Diagnosis not present

## 2023-06-24 NOTE — Progress Notes (Signed)
   I, Stevenson Clinch, CMA acting as a scribe for Clementeen Graham, MD.  Charles Rasmussen is a 52 y.o. male who presents to [facility] at [facility] today for bilat heel pain x 2 weeks. Sx started after jumping several times. Pt locates pain to posterior aspects of the ankles. Swelling and palpable mass present. Also has intermittent heel pain. One of his forms of exercise is jumping.  He is trying to be able to jump high enough again to touch the basketball rim.  Aggravates: WB, ambulation Treatments tried: ice  Pertinent review of systems: No fevers or chills  Relevant historical information: A-fib and hypertension   Exam:  BP 102/66   Pulse 76   Ht 6\' 4"  (1.93 m)   Wt 201 lb (91.2 kg)   SpO2 97%   BMI 24.47 kg/m  General: Well Developed, well nourished, and in no acute distress.   MSK: Heels bilaterally swelling at posterior calcaneus otherwise normal. Normal foot and ankle motion.  Tender palpation posterior calcaneus bilateral heels.    Lab and Radiology Results  Diagnostic Limited MSK Ultrasound of: Bilateral Achilles tendon Right Achilles tendon intact calcific change at distal tendon insertion and small retrocalcaneal bursitis is present. Left Achilles tendon intact with chronic calcific change at distal tendon insertion and small retrocalcaneal bursitis is present. 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     Assessment and Plan: 52 y.o. male with bilateral chronic calcific Achilles tendinitis and retrocalcaneal bursitis.  Plan for eccentric exercises Voltaren gel and night splints and ice.  In 1 month.  If not improved consider nitroglycerin patch protocol and or shockwave therapy   PDMP not reviewed this encounter. Orders Placed This Encounter  Procedures   [place] LIMITED JOINT SPACE STRUCTURES LOW BILAT(NO LINKED CHARGES)    Order Specific Question:   Reason for Exam (SYMPTOM  OR DIAGNOSIS REQUIRED)     Answer:   bilat ankle pain    Order Specific Question:   Preferred imaging location?    Answer:   [facility]   No orders of the defined types were placed in this encounter.    Discussed warning signs or symptoms. Please see discharge instructions. Patient expresses understanding.   The above documentation has been reviewed and is accurate and complete Clementeen Graham, M.D.

## 2023-06-24 NOTE — Patient Instructions (Addendum)
Thank you for coming in today.   Please use Voltaren gel (Generic Diclofenac Gel) up to 4x daily for pain as needed.  This is available over-the-counter as both the name brand Voltaren gel and the generic diclofenac gel.   Please work on the home exercises the athletic trainer went over with you:  View at [email or web address]-[email or web address] using code: [id number]  Plantar fascia night splint  Check back in 1 month

## 2023-07-01 ENCOUNTER — Encounter: Payer: Self-pay | Admitting: Family Medicine

## 2023-07-01 ENCOUNTER — Ambulatory Visit: Payer: BC Managed Care – PPO | Admitting: Family Medicine

## 2023-07-01 ENCOUNTER — Other Ambulatory Visit: Payer: BC Managed Care – PPO

## 2023-07-01 VITALS — BP 118/72 | HR 78 | Temp 97.7°F | Ht 76.0 in | Wt 203.0 lb

## 2023-07-01 DIAGNOSIS — M6528 Calcific tendinitis, other site: Secondary | ICD-10-CM | POA: Diagnosis not present

## 2023-07-01 DIAGNOSIS — Z0000 Encounter for general adult medical examination without abnormal findings: Secondary | ICD-10-CM

## 2023-07-01 DIAGNOSIS — I10 Essential (primary) hypertension: Secondary | ICD-10-CM

## 2023-07-01 DIAGNOSIS — E782 Mixed hyperlipidemia: Secondary | ICD-10-CM

## 2023-07-01 LAB — LIPID PANEL

## 2023-07-01 NOTE — Patient Instructions (Signed)
Recommend Voltaren gel over-the-counter, can be used 4 times a day.  Also recommend alternating between ice and heat, about 10 minutes on and 10 minutes off.  Can continue to do exercises as long as there is no significant pain.  Recommend to do calf stretches and calf raises and range of motion exercises with the ankle as well.

## 2023-07-01 NOTE — Progress Notes (Signed)
BP 118/72   Pulse 78   Temp 97.7 F (36.5 C) (Temporal)   Ht 6\' 4"  (1.93 m)   Wt 203 lb (92.1 kg)   SpO2 93%   BMI 24.71 kg/m    Subjective:   Patient ID: Graylen HANAN VADO, male    DOB: June 16, 1971, 52 y.o.   MRN: [id number]  HPI: Beryl ORA CRARY is a 52 y.o. male presenting on 07/01/2023 for Ankle Pain (bilateral)   HPI Bilateral Achilles tendon ankle pain Patient is coming in with complaints of bilateral Achilles tendon ankle pain that is been bothering him for about a month ago.  He says he started doing a jumping regiment with his exercise regiment in the summer and then he stops for a little bit and then he started up again some month ago when he started feeling this irritation in the Achilles region near the base of his Achilles on the back of his heel.  It has been worse on the right than the left.  He also has the swelling and nodule that he can feel on that back of his ankle as well especially on the right one, not as much on the left 1.  He says it is starting to get slightly better and he has been taking some anti-inflammatories for it.  He did also go see a sports medicine doctor who gave him a list of some exercises and stretches that he could do.  Relevant past medical, surgical, family and social history reviewed and updated as indicated. Interim medical history since our last visit reviewed. Allergies and medications reviewed and updated.  Review of Systems  Constitutional:  Negative for chills and fever.  Eyes:  Negative for visual disturbance.  Respiratory:  Negative for shortness of breath and wheezing.   Cardiovascular:  Negative for chest pain and leg swelling.  Musculoskeletal:  Positive for arthralgias and joint swelling. Negative for back pain and gait problem.  Skin:  Negative for rash.  All other systems reviewed and are negative.   Per HPI unless specifically indicated above   Allergies as of 07/01/2023   No Known Allergies      Medication List         Accurate as of July 01, 2023  4:42 PM. If you have any questions, ask your nurse or doctor.          diltiazem 30 MG tablet Commonly known as: CARDIZEM TAKE 1 TABLET BY MOUTH EVERY 4 HOURS AS NEEDED FOR AFIB HEART RATE > 100, AS LONG AS BLOOD PRESSURE IS >100   doxycycline 100 MG tablet Commonly known as: VIBRA-TABS Take 1 tablet (100 mg total) by mouth 2 (two) times daily. 1 po bid   metoprolol tartrate 25 MG tablet Commonly known as: LOPRESSOR Take 1/2 (one-half) tablet by mouth twice daily.   rosuvastatin 5 MG tablet Commonly known as: Crestor Take 1 tablet (5 mg total) by mouth at bedtime as needed.         Objective:   BP 118/72   Pulse 78   Temp 97.7 F (36.5 C) (Temporal)   Ht 6\' 4"  (1.93 m)   Wt 203 lb (92.1 kg)   SpO2 93%   BMI 24.71 kg/m   Wt Readings from Last 3 Encounters:  07/01/23 203 lb (92.1 kg)  06/24/23 201 lb (91.2 kg)  12/01/22 194 lb 12.8 oz (88.4 kg)    Physical Exam Vitals and nursing note reviewed.  Constitutional:      Appearance:  Normal appearance.  Musculoskeletal:     Right ankle: No swelling or deformity. No tenderness. Normal range of motion.     Right Achilles Tendon: Tenderness (Slight tenderness at the bases Achilles tendon with a firm calcification nodule palpable.) present.     Left ankle: No swelling or deformity. No tenderness. Normal range of motion.     Left Achilles Tendon: No tenderness.  Neurological:     Mental Status: He is alert.       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  Also recommended that he wear good shoes that have good padding on the posterior aspect of his ankle  Follow up plan: Return if  symptoms worsen or fail to improve.  Counseling provided for all of the vaccine components No orders of the defined types were placed in this encounter.   Arville Care, MD [facility] 07/01/2023, 4:42 PM

## 2023-07-02 LAB — CMP14+EGFR
ALT: 48 IU/L — ABNORMAL HIGH (ref 0–44)
AST: 36 IU/L (ref 0–40)
Albumin: 4.3 g/dL (ref 3.8–4.9)
Alkaline Phosphatase: 83 IU/L (ref 44–121)
BUN/Creatinine Ratio: 16 (ref 9–20)
BUN: 16 mg/dL (ref 6–24)
Bilirubin Total: 0.5 mg/dL (ref 0.0–1.2)
CO2: 23 mmol/L (ref 20–29)
Calcium: 9.4 mg/dL (ref 8.7–10.2)
Chloride: 103 mmol/L (ref 96–106)
Creatinine, Ser: 1.01 mg/dL (ref 0.76–1.27)
Globulin, Total: 2.7 g/dL (ref 1.5–4.5)
Glucose: 96 mg/dL (ref 70–99)
Potassium: 4.6 mmol/L (ref 3.5–5.2)
Sodium: 139 mmol/L (ref 134–144)
Total Protein: 7 g/dL (ref 6.0–8.5)
eGFR: 88 mL/min/{1.73_m2} (ref >59)

## 2023-07-02 LAB — CBC WITH DIFFERENTIAL/PLATELET
Basophils Absolute: 0 10*3/uL (ref 0.0–0.2)
Basos: 1 %
EOS (ABSOLUTE): 0.1 10*3/uL (ref 0.0–0.4)
Eos: 2 %
Hematocrit: 43 % (ref 37.5–51.0)
Hemoglobin: 14.3 g/dL (ref 13.0–17.7)
Immature Grans (Abs): 0 10*3/uL (ref 0.0–0.1)
Immature Granulocytes: 0 %
Lymphocytes Absolute: 1.4 10*3/uL (ref 0.7–3.1)
Lymphs: 28 %
MCH: 30.3 pg (ref 26.6–33.0)
MCHC: 33.3 g/dL (ref 31.5–35.7)
MCV: 91 fL (ref 79–97)
Monocytes Absolute: 0.4 10*3/uL (ref 0.1–0.9)
Monocytes: 9 %
Neutrophils Absolute: 3 10*3/uL (ref 1.4–7.0)
Neutrophils: 60 %
Platelets: 282 10*3/uL (ref 150–450)
RBC: 4.72 x10E6/uL (ref 4.14–5.80)
RDW: 12 % (ref 11.6–15.4)
WBC: 5.1 10*3/uL (ref 3.4–10.8)

## 2023-07-02 LAB — LIPID PANEL
Cholesterol, Total: 175 mg/dL (ref 100–199)
HDL: 71 mg/dL (ref >39)
LDL CALC COMMENT:: 2.5 ratio (ref 0.0–5.0)
LDL Chol Calc (NIH): 91 mg/dL (ref 0–99)
Triglycerides: 70 mg/dL (ref 0–149)
VLDL Cholesterol Cal: 13 mg/dL (ref 5–40)

## 2023-07-16 ENCOUNTER — Encounter: Payer: Self-pay | Admitting: Family Medicine

## 2023-07-16 ENCOUNTER — Ambulatory Visit (INDEPENDENT_AMBULATORY_CARE_PROVIDER_SITE_OTHER): Payer: 59

## 2023-07-16 ENCOUNTER — Ambulatory Visit (INDEPENDENT_AMBULATORY_CARE_PROVIDER_SITE_OTHER): Payer: Self-pay | Admitting: Family Medicine

## 2023-07-16 VITALS — BP 102/69 | HR 88 | Ht 76.0 in | Wt 200.0 lb

## 2023-07-16 DIAGNOSIS — I10 Essential (primary) hypertension: Secondary | ICD-10-CM | POA: Diagnosis not present

## 2023-07-16 DIAGNOSIS — R0789 Other chest pain: Secondary | ICD-10-CM

## 2023-07-16 DIAGNOSIS — E782 Mixed hyperlipidemia: Secondary | ICD-10-CM

## 2023-07-16 DIAGNOSIS — I480 Paroxysmal atrial fibrillation: Secondary | ICD-10-CM | POA: Diagnosis not present

## 2023-07-16 MED ORDER — ROSUVASTATIN CALCIUM 5 MG PO TABS: 5.0000 mg | ORAL_TABLET | Freq: Every evening | ORAL | 3 refills | Status: DC | PRN

## 2023-07-16 NOTE — Progress Notes (Signed)
 BP 102/69   Pulse 88   Ht 6' 4 (1.93 m)   Wt 200 lb (90.7 kg)   SpO2 96%   BMI 24.34 kg/m    Subjective:   Patient ID: Charles Rasmussen, male    DOB: April 14, 1971, 53 y.o.   MRN: [id number]  HPI: Charles Rasmussen is a 53 y.o. male presenting on 07/16/2023 for Medical Management of Chronic Issues and Hyperlipidemia   HPI Hyperlipidemia Patient is coming in for recheck of his hyperlipidemia. The patient is currently taking Crestor . They deny any issues with myalgias or history of liver damage from it. They deny any focal numbness or weakness or chest pain.   Hypertension and A-fib recheck Patient is currently on metoprolol  and diltiazem , and their blood pressure today is 102/69. Patient denies any lightheadedness or dizziness. Patient denies headaches, blurred vision, chest pains, shortness of breath, or weakness. Denies any side effects from medication and is content with current medication.   Elevated ALT Patient had elevated ALT on his blood work that he had recently.  This was new and not something that he had previously.  The rest of his liver function panels were good.  Chest wall pain Patient occasionally gets chest wall pain when he wakes up in the morning that will last a few minutes and then go away.  He denies any shortness of breath or wheezing or palpitations outside of his normal palpitations that he gets.  He denies any reflux.  Relevant past medical, surgical, family and social history reviewed and updated as indicated. Interim medical history since our last visit reviewed. Allergies and medications reviewed and updated.  Review of Systems  Constitutional:  Negative for chills and fever.  Eyes:  Negative for visual disturbance.  Respiratory:  Negative for shortness of breath and wheezing.   Cardiovascular:  Positive for chest pain and palpitations. Negative for leg swelling.  Musculoskeletal:  Negative for back pain and gait problem.  Skin:  Negative for rash.   Neurological:  Negative for dizziness, weakness and light-headedness.  All other systems reviewed and are negative.   Per HPI unless specifically indicated above   Allergies as of 07/16/2023   No Known Allergies      Medication List        Accurate as of July 16, 2023  3:24 PM. If you have any questions, ask your nurse or doctor.          STOP taking these medications    doxycycline  100 MG tablet Commonly known as: VIBRA -TABS Stopped by: Fonda LABOR Wilmon Conover       TAKE these medications    diltiazem  30 MG tablet Commonly known as: CARDIZEM  TAKE 1 TABLET BY MOUTH EVERY 4 HOURS AS NEEDED FOR AFIB HEART RATE > 100, AS LONG AS BLOOD PRESSURE IS >100   metoprolol  tartrate 25 MG tablet Commonly known as: LOPRESSOR  Take 1/2 (one-half) tablet by mouth twice daily.   rosuvastatin  5 MG tablet Commonly known as: Crestor  Take 1 tablet (5 mg total) by mouth at bedtime as needed.         Objective:   BP 102/69   Pulse 88   Ht 6' 4 (1.93 m)   Wt 200 lb (90.7 kg)   SpO2 96%   BMI 24.34 kg/m   Wt Readings from Last 3 Encounters:  07/16/23 200 lb (90.7 kg)  07/01/23 203 lb (92.1 kg)  06/24/23 201 lb (91.2 kg)    Physical Exam Vitals and nursing note reviewed.  Constitutional:      General: He is not in acute distress.    Appearance: He is well-developed. He is not diaphoretic.  Eyes:     General: No scleral icterus.    Conjunctiva/sclera: Conjunctivae normal.  Neck:     Thyroid : No thyromegaly.  Cardiovascular:     Rate and Rhythm: Normal rate and regular rhythm.     Heart sounds: Normal heart sounds. No murmur heard. Pulmonary:     Effort: Pulmonary effort is normal. No respiratory distress.     Breath sounds: Normal breath sounds. No wheezing.  Chest:     Chest wall: Tenderness (Right mid clavicular chest wall tenderness around the level of the 6th-7th rib) present.  Musculoskeletal:        General: Normal range of motion.     Cervical back: Neck  supple.  Lymphadenopathy:     Cervical: No cervical adenopathy.  Skin:    General: Skin is warm and dry.     Findings: No rash.  Neurological:     Mental Status: He is alert and oriented to person, place, and time.     Coordination: Coordination normal.  Psychiatric:        Behavior: Behavior normal.     Results for orders placed or performed in visit on 07/01/23  CBC with Differential/Platelet   Collection Time: 07/01/23  8:37 AM  Result Value Ref Range   WBC 5.1 3.4 - 10.8 x10E3/uL   RBC 4.72 4.14 - 5.80 x10E6/uL   Hemoglobin 14.3 13.0 - 17.7 g/dL   Hematocrit 56.9 62.4 - 51.0 %   MCV 91 79 - 97 fL   MCH 30.3 26.6 - 33.0 pg   MCHC 33.3 31.5 - 35.7 g/dL   RDW 87.9 88.3 - 84.5 %   Platelets 282 150 - 450 x10E3/uL   Neutrophils 60 Not Estab. %   Lymphs 28 Not Estab. %   Monocytes 9 Not Estab. %   Eos 2 Not Estab. %   Basos 1 Not Estab. %   Neutrophils Absolute 3.0 1.4 - 7.0 x10E3/uL   Lymphocytes Absolute 1.4 0.7 - 3.1 x10E3/uL   Monocytes Absolute 0.4 0.1 - 0.9 x10E3/uL   EOS (ABSOLUTE) 0.1 0.0 - 0.4 x10E3/uL   Basophils Absolute 0.0 0.0 - 0.2 x10E3/uL   Immature Granulocytes 0 Not Estab. %   Immature Grans (Abs) 0.0 0.0 - 0.1 x10E3/uL  CMP14+EGFR   Collection Time: 07/01/23  8:37 AM  Result Value Ref Range   Glucose 96 70 - 99 mg/dL   BUN 16 6 - 24 mg/dL   Creatinine, Ser 8.98 0.76 - 1.27 mg/dL   eGFR 88 >40 fO/fpw/8.26   BUN/Creatinine Ratio 16 9 - 20   Sodium 139 134 - 144 mmol/L   Potassium 4.6 3.5 - 5.2 mmol/L   Chloride 103 96 - 106 mmol/L   CO2 23 20 - 29 mmol/L   Calcium  9.4 8.7 - 10.2 mg/dL   Total Protein 7.0 6.0 - 8.5 g/dL   Albumin 4.3 3.8 - 4.9 g/dL   Globulin, Total 2.7 1.5 - 4.5 g/dL   Bilirubin Total 0.5 0.0 - 1.2 mg/dL   Alkaline Phosphatase 83 44 - 121 IU/L   AST 36 0 - 40 IU/L   ALT 48 (H) 0 - 44 IU/L  Lipid panel   Collection Time: 07/01/23  8:37 AM  Result Value Ref Range   Cholesterol, Total 175 100 - 199 mg/dL   Triglycerides 70 0  - 149 mg/dL  HDL 71 >39 mg/dL   VLDL Cholesterol Cal 13 5 - 40 mg/dL   LDL Chol Calc (NIH) 91 0 - 99 mg/dL   Chol/HDL Ratio 2.5 0.0 - 5.0 ratio    Assessment & Plan:   Problem List Items Addressed This Visit       Cardiovascular and Mediastinum   Hypertensive disorder   Relevant Medications   rosuvastatin  (CRESTOR ) 5 MG tablet   Other Relevant Orders   CBC with Differential/Platelet   CMP14+EGFR   Lipid panel   PSA, total and free   A-fib (HCC)   Relevant Medications   rosuvastatin  (CRESTOR ) 5 MG tablet   Other Relevant Orders   CBC with Differential/Platelet   CMP14+EGFR   Lipid panel   PSA, total and free     Other   Hyperlipidemia - Primary   Relevant Medications   rosuvastatin  (CRESTOR ) 5 MG tablet   Other Relevant Orders   CBC with Differential/Platelet   CMP14+EGFR   Lipid panel   PSA, total and free   Other Visit Diagnoses       Chest wall pain       Relevant Orders   DG Chest 2 View       Will do chest x-ray for right sided chest wall pain mid clavicular  Blood work looks good, will have him do a blood work when he returns again in 6 months.  Elevated LFT where one of the numbers is borderline but not too concerned about it. Follow up plan: Return in about 6 months (around 01/13/2024), or if symptoms worsen or fail to improve, for Physical exam.  Counseling provided for all of the vaccine components Orders Placed This Encounter  Procedures   DG Chest 2 View   CBC with Differential/Platelet   CMP14+EGFR   Lipid panel   PSA, total and free    Fonda Levins, MD [facility] 07/16/2023, 3:24 PM

## 2023-07-24 NOTE — Progress Notes (Signed)
   LILLETTE Ileana Collet, PhD, LAT, ATC acting as a scribe for Artist Lloyd, MD.  Charles Rasmussen is a 53 y.o. male who presents to [facility] at [facility] today for 8-month f/u bilat calcific Achilles tendinopathy. Pt was last seen by Dr. Lloyd on 06/24/23 and was taught HEP, and advised to use Voltaren gel, ice, and night splints.   Today, pt reports he has been compliant w/ HEP. R Achilles pain has resolved. L Achilles pain is improved, but will hurt esp w/ brisk walking. Also TTP.   Pertinent review of systems: No fevers or chills  Relevant historical information: Atrial fibrillation and hypertension managed with metoprolol  and diltiazem .   Exam:  BP 130/78   Pulse 76   Ht 6' 4 (1.93 m)   Wt 205 lb (93 kg)   SpO2 98%   BMI 24.95 kg/m  General: Well Developed, well nourished, and in no acute distress.   MSK: Left Achilles tendon normal-appearing Mildly tender palpation posterior Achilles.  Normal foot and ankle motion.   Assessment and Plan: 53 y.o. male with chronic calcific Achilles tendinitis bilateral left worse than right.  Improving with eccentric exercises.  Plan to continue exercises.  If not improved consider adding nitroglycerin patch protocol or shockwave.  We discussed both.  I can add the nitroglycerin with a phone call or MyChart message.  Check back as needed.   PDMP not reviewed this encounter. No orders of the defined types were placed in this encounter.  No orders of the defined types were placed in this encounter.    Discussed warning signs or symptoms. Please see discharge instructions. Patient expresses understanding.   The above documentation has been reviewed and is accurate and complete Artist Lloyd, M.D.

## 2023-07-25 ENCOUNTER — Encounter: Payer: Self-pay | Admitting: Family Medicine

## 2023-07-25 ENCOUNTER — Other Ambulatory Visit: Payer: Self-pay | Admitting: Internal Medicine

## 2023-07-27 ENCOUNTER — Ambulatory Visit: Payer: 59 | Admitting: Family Medicine

## 2023-07-27 ENCOUNTER — Telehealth: Payer: Self-pay | Admitting: Internal Medicine

## 2023-07-27 VITALS — BP 130/78 | HR 76 | Ht 76.0 in | Wt 205.0 lb

## 2023-07-27 DIAGNOSIS — M6528 Calcific tendinitis, other site: Secondary | ICD-10-CM

## 2023-07-27 MED ORDER — METOPROLOL TARTRATE 25 MG PO TABS: ORAL_TABLET | ORAL | 0 refills | Status: DC

## 2023-07-27 NOTE — Patient Instructions (Addendum)
 Thank you for coming in today.   Continue home exercises.   We can add nitroglycerin patches if needed.   If not better next step would be nitroglycerine patches and or shockwave.  Let me know.   Recheck as needed based on how you do.

## 2023-07-27 NOTE — Telephone Encounter (Signed)
*  STAT* If patient is at the pharmacy, call can be transferred to refill team.   1. Which medications need to be refilled? (please list name of each medication and dose if known) metoprolol  tartrate (LOPRESSOR ) 25 MG tablet    2. Would you like to learn more about the convenience, safety, & potential cost savings by using the [facility]?     3. Are you open to using the [facility] (Type [facility]. ).   4. Which pharmacy/location (including street and city if local pharmacy) is medication to be sent to?[facility] - [place], [place] - [address]    5. Do they need a 30 day or 90 day supply? 90

## 2023-08-11 ENCOUNTER — Encounter: Payer: Self-pay | Admitting: Internal Medicine

## 2023-08-11 ENCOUNTER — Ambulatory Visit: Payer: 59 | Attending: Internal Medicine | Admitting: Internal Medicine

## 2023-08-11 VITALS — BP 100/66 | HR 69 | Ht 76.0 in | Wt 202.4 lb

## 2023-08-11 DIAGNOSIS — I480 Paroxysmal atrial fibrillation: Secondary | ICD-10-CM

## 2023-08-11 DIAGNOSIS — I493 Ventricular premature depolarization: Secondary | ICD-10-CM

## 2023-08-11 MED ORDER — DILTIAZEM HCL 30 MG PO TABS: ORAL_TABLET | ORAL | 0 refills | Status: DC

## 2023-08-11 MED ORDER — METOPROLOL TARTRATE 25 MG PO TABS: ORAL_TABLET | ORAL | 3 refills | Status: DC

## 2023-08-11 NOTE — Patient Instructions (Signed)

## 2023-08-11 NOTE — Progress Notes (Signed)
.  sk     Patient Care Team: Dettinger, Elige Radon, MD as PCP - General (Family Medicine) Laurey Morale, MD (Cardiology) Arminda Resides, MD (Dermatology) Olena Leatherwood., MD (Ophthalmology)   HPI  Charles Rasmussen is a 53 y.o. male Seen in follow-up for palpitations which include and surprisingly PVCs atrial fibrillation requiring cardioversion most recently 09/03/16 ( AFIB CLINIC reports 2/21)     Intermittent palpitations.  Quite abrupt.  May be worse at the end of the week and after he has been on dates.  Thinks that increase metoprolol tartrate is associated with more lethargy.  In 2018 we have given a prescription for 3 different beta-blocker he ended up not doing these, but he ends up remaining on low-dose metoprolol tartrate and a little bit of diltiazem through the day.  Anxiety continues to be an issue;  His father died about a year ago.  He lives with his brother.  That is going well. The patient denies chest pain, shortness of breath, nocturnal dyspnea, orthopnea or peripheral edema.  There have been no palpitations, lightheadedness or syncope.    Thromboembolic risk factors ( neg) for a CHADSVASc Score of 0   Past Medical History:  Diagnosis Date   Anxiety    Atrial fibrillation (HCC)    Hemorrhoid thrombosis    Mitral valve prolapse    Unspecified hemorrhoids without mention of complication 2006   Venous reflux 01/02/12   deep right per [name]. Hart Rochester, M.D.    Past Surgical History:  Procedure Laterality Date   COLONOSCOPY     MOUTH SURGERY     wisdom teeth    Current Outpatient Medications  Medication Sig Dispense Refill   diltiazem (CARDIZEM) 30 MG tablet TAKE 1 TABLET BY MOUTH EVERY 4 HOURS AS NEEDED FOR AFIB HEART RATE > 100, AS LONG AS BLOOD PRESSURE IS >100 90 tablet 0   metoprolol tartrate (LOPRESSOR) 25 MG tablet Take 1/2 (one-half) tablet by mouth twice daily. 90 tablet 0   rosuvastatin (CRESTOR) 5 MG tablet Take 1 tablet (5 mg total) by mouth at  bedtime as needed. 90 tablet 3   No current facility-administered medications for this visit.    No Known Allergies    Review of Systems negative except from HPI and PMH  Physical Exam BP 100/66   Pulse 69   Ht 6\' 4"  (1.93 m)   Wt 202 lb 6.4 oz (91.8 kg)   SpO2 99%   BMI 24.64 kg/m  Well developed and well nourished in no acute distress HENT normal Neck supple with JVP-flat Clear  Regular rate and rhythm, no  gallop No murmur Abd-soft with active BS No Clubbing cyanosis  edema Skin-warm and dry A & Oriented  Grossly normal sensory and motor function  ECG sinus @ 69 17/09/37 r'    Assessment and  Plan  Palpitations-PVCs  Side effects ? BB  Atrial Fib paroxysmal  Anxiety   Palpitations are quiescient on his low doses metoprolol and diltiazem.  We will make no changes.  He is getting ready to retire in September.  Anxiety is better

## 2023-08-24 NOTE — Progress Notes (Deleted)
   Rubin Payor, PhD, LAT, ATC acting as a scribe for Clementeen Graham, MD.  Charles Rasmussen is a 53 y.o. male who presents to [facility] at [facility] today for 67-month f/u bilat calcific Achilles tendinopathy. Pt was last seen by Dr. Denyse Amass on 07/27/23 and was advised to cont HEP  Today, pt reports ***  Pertinent review of systems: ***  Relevant historical information: ***   Exam:  There were no vitals taken for this visit. General: Well Developed, well nourished, and in no acute distress.   MSK: ***    Lab and Radiology Results No results found for this or any previous visit (from the past 72 hours). No results found.     Assessment and Plan: 53 y.o. male with ***   PDMP not reviewed this encounter. No orders of the defined types were placed in this encounter.  No orders of the defined types were placed in this encounter.    Discussed warning signs or symptoms. Please see discharge instructions. Patient expresses understanding.   ***

## 2023-08-25 ENCOUNTER — Ambulatory Visit: Payer: 59 | Admitting: Family Medicine

## 2023-11-09 ENCOUNTER — Other Ambulatory Visit: Payer: Self-pay | Admitting: Internal Medicine

## 2023-11-10 ENCOUNTER — Telehealth: Payer: Self-pay | Admitting: Internal Medicine

## 2023-11-10 ENCOUNTER — Telehealth: Payer: Self-pay

## 2023-11-10 MED ORDER — DILTIAZEM HCL 30 MG PO TABS: ORAL_TABLET | ORAL | 2 refills | Status: DC

## 2023-11-10 NOTE — Telephone Encounter (Signed)
**Note De-identified  [place] Obfuscation** Please advise 

## 2023-11-10 NOTE — Telephone Encounter (Signed)
*  STAT* If patient is at the pharmacy, call can be transferred to refill team.   1. Which medications need to be refilled? (please list name of each medication and dose if known)   diltiazem  (CARDIZEM ) 30 MG tablet     2. Would you like to learn more about the convenience, safety, & potential cost savings by using the [facility]? No   3. Are you open to using the [facility] (Type [facility] No   4. Which pharmacy/location (including street and city if local pharmacy) is medication to be sent to?  [facility] - [place], [place] - [address]     5. Do they need a 30 day or 90 day supply? 90 day

## 2023-11-10 NOTE — Telephone Encounter (Signed)
 RX sent to requested Pharmacy

## 2023-11-10 NOTE — Telephone Encounter (Unsigned)
 Copied from CRM [id number]. Topic: Clinical - Medical Advice >> Nov 10, 2023  1:50 PM Carla L wrote: Reason for CRM: Pt started having metallic taste in mouth, has not started any new medications. Denies any other symptoms, started about two weeks ago.   Pt concerned and seeking clinical advice  If appointment is needed pt okay w/ coming.

## 2023-11-11 NOTE — Telephone Encounter (Signed)
 Patient aware and verbalized understanding.

## 2023-11-11 NOTE — Telephone Encounter (Signed)
 Have him focus on dental and gum care first, if he continues to have the metallic taste after trying that then you make an appointment and we can discuss it and see what other things might be going on

## 2023-12-01 ENCOUNTER — Ambulatory Visit (INDEPENDENT_AMBULATORY_CARE_PROVIDER_SITE_OTHER): Payer: Self-pay | Admitting: Family Medicine

## 2023-12-01 ENCOUNTER — Encounter: Payer: Self-pay | Admitting: Family Medicine

## 2023-12-01 VITALS — BP 110/78 | HR 80 | Ht 76.0 in | Wt 202.0 lb

## 2023-12-01 DIAGNOSIS — M7712 Lateral epicondylitis, left elbow: Secondary | ICD-10-CM | POA: Diagnosis not present

## 2023-12-01 NOTE — Progress Notes (Signed)
   I, Miquel Amen, CMA acting as a scribe for Charles Juniper, MD.  Charles Rasmussen is a 53 y.o. male who presents to [facility] at [facility] today for L elbow pain. Pt was previously seen by Dr. Alease Hunter 07/27/23 for bilat calcific Achilles tendinopathy .  Today, pt c/o L elbow pain x 2-3 months. Thinks related to overuse. Locates pain to lateral aspect of the elbow. Denies swelling, warmth, erythema.   Radiates: into the lower arm Paresthesia: denies Grip strength: normal Aggravates: stretching arm forward, lifting anything with weight Treatments tried: ice, massage therapy  Pertinent review of systems: No fevers or chills  Relevant historical information: Hypertension and A-fib.   Exam:  BP 110/78   Pulse 80   Ht 6\' 4"  (1.93 m)   Wt 202 lb (91.6 kg)   SpO2 97%   BMI 24.59 kg/m  General: Well Developed, well nourished, and in no acute distress.   MSK: Left elbow normal-appearing Normal motion. Tender palpation lateral epicondyle. Intact strength. Pain with resisted wrist extension.      Assessment and Plan: 53 y.o. male with left lateral elbow pain due to lateral epicondylitis.  Plan for home exercise program.  Consider occupational therapy referral.  Check back as needed.   PDMP not reviewed this encounter. No orders of the defined types were placed in this encounter.  No orders of the defined types were placed in this encounter.    Discussed warning signs or symptoms. Please see discharge instructions. Patient expresses understanding.   The above documentation has been reviewed and is accurate and complete Charles Rasmussen, M.D.

## 2023-12-01 NOTE — Patient Instructions (Addendum)
 Thank you for coming in today.   Please work on the home exercises the athletic trainer went over with you: View at [email or web address] code [email or web address]   Elbow strap  If not better in a month, let me know and I will refer you to hand therapy.

## 2023-12-11 ENCOUNTER — Ambulatory Visit: Admitting: Family Medicine

## 2023-12-11 ENCOUNTER — Encounter: Payer: Self-pay | Admitting: Family Medicine

## 2023-12-11 VITALS — BP 108/66 | HR 77 | Ht 76.0 in | Wt 201.0 lb

## 2023-12-11 DIAGNOSIS — R432 Parageusia: Secondary | ICD-10-CM | POA: Diagnosis not present

## 2023-12-11 DIAGNOSIS — R439 Unspecified disturbances of smell and taste: Secondary | ICD-10-CM

## 2023-12-11 NOTE — Progress Notes (Signed)
 BP 108/66   Pulse 77   Ht 6\' 4"  (1.93 m)   Wt 201 lb (91.2 kg)   SpO2 96%   BMI 24.47 kg/m    Subjective:   Patient ID: Charles Rasmussen, male    DOB: 1970/11/26, 53 y.o.   MRN: [id number]  HPI: Charles Rasmussen is a 53 y.o. male presenting on 12/11/2023 for Foods taste sour (Started one month ago)   HPI Olfactory changes Patient has been having some olfactory and taste changes recently.  He says he constantly has a sour taste in his mouth when he is eating and things taste different.  He also feels like he has had a loss of smell.  He says has been going on for a month or 2 and went saw the dentist and has been working on Administrator and has not improved.  He did change mouthwashes and that did not improve things.  He denies any sinus congestion or drainage or allergies.  He denies any acid reflux or heartburn or indigestion or belching or burping.  He just says it does not seem to be getting better  Relevant past medical, surgical, family and social history reviewed and updated as indicated. Interim medical history since our last visit reviewed. Allergies and medications reviewed and updated.  Review of Systems  Constitutional:  Negative for chills and fever.  HENT:  Negative for congestion, postnasal drip, rhinorrhea, sinus pressure, sinus pain, sore throat and trouble swallowing.   Eyes:  Negative for visual disturbance.  Respiratory:  Negative for shortness of breath and wheezing.   Cardiovascular:  Negative for chest pain and leg swelling.  Gastrointestinal:  Negative for abdominal pain.  Musculoskeletal:  Negative for back pain and gait problem.  Skin:  Negative for rash.  All other systems reviewed and are negative.   Per HPI unless specifically indicated above   Allergies as of 12/11/2023   No Known Allergies      Medication List        Accurate as of Dec 11, 2023  1:53 PM. If you have any questions, ask your nurse or doctor.          diltiazem  30 MG  tablet Commonly known as: CARDIZEM  TAKE 1 TABLET BY MOUTH EVERY 4 HOURS AS NEEDED FOR AFIB HEART RATE > 100, AS LONG AS BLOOD PRESSURE IS >100   metoprolol  tartrate 25 MG tablet Commonly known as: LOPRESSOR  Take 1/2 (one-half) tablet by mouth twice daily.   rosuvastatin  5 MG tablet Commonly known as: Crestor  Take 1 tablet (5 mg total) by mouth at bedtime as needed.         Objective:   BP 108/66   Pulse 77   Ht 6\' 4"  (1.93 m)   Wt 201 lb (91.2 kg)   SpO2 96%   BMI 24.47 kg/m   Wt Readings from Last 3 Encounters:  12/11/23 201 lb (91.2 kg)  12/01/23 202 lb (91.6 kg)  08/11/23 202 lb 6.4 oz (91.8 kg)    Physical Exam Vitals and nursing note reviewed.  Constitutional:      General: He is not in acute distress.    Appearance: He is well-developed. He is not diaphoretic.  HENT:     Nose: No nasal deformity, nasal tenderness or mucosal edema.     Right Turbinates: Not enlarged or swollen.     Left Turbinates: Not enlarged or swollen.     Right Sinus: No maxillary sinus tenderness or frontal sinus tenderness.  Left Sinus: No maxillary sinus tenderness or frontal sinus tenderness.     Mouth/Throat:     Lips: Pink.     Mouth: Mucous membranes are moist.     Dentition: Normal dentition. No dental tenderness, dental caries or dental abscesses.     Tongue: No lesions.     Palate: No mass and lesions.     Pharynx: Oropharynx is clear. Uvula midline. No pharyngeal swelling, oropharyngeal exudate, posterior oropharyngeal erythema or uvula swelling.     Tonsils: No tonsillar exudate or tonsillar abscesses.  Eyes:     General: No scleral icterus.    Conjunctiva/sclera: Conjunctivae normal.  Neck:     Thyroid : No thyromegaly.  Musculoskeletal:        General: Normal range of motion.     Cervical back: Neck supple.  Lymphadenopathy:     Cervical: No cervical adenopathy.  Skin:    General: Skin is warm and dry.     Findings: No rash.  Neurological:     Mental Status: He  is alert and oriented to person, place, and time.     Coordination: Coordination normal.  Psychiatric:        Behavior: Behavior normal.       Assessment & Plan:   Problem List Items Addressed This Visit   None Visit Diagnoses       Dysgeusia    -  Primary   Relevant Orders   Ambulatory referral to ENT   MR Brain Wo Contrast     Olfactory impairment       Relevant Orders   Ambulatory referral to ENT   MR Brain Wo Contrast     With the patient's change in taste and smell and been persistent and not improving and also having loss of smell, will do an MRI and ENT referral.  Follow up plan: Return if symptoms worsen or fail to improve.  Counseling provided for all of the vaccine components Orders Placed This Encounter  Procedures   MR Brain Wo Contrast   Ambulatory referral to ENT    Jolyne Needs, MD [facility] 12/11/2023, 1:53 PM

## 2023-12-28 ENCOUNTER — Ambulatory Visit (HOSPITAL_COMMUNITY): Payer: Self-pay

## 2024-01-06 ENCOUNTER — Encounter: Payer: Self-pay | Admitting: Nurse Practitioner

## 2024-01-06 ENCOUNTER — Ambulatory Visit: Admitting: Nurse Practitioner

## 2024-01-06 VITALS — BP 95/60 | HR 94 | Temp 97.0°F | Ht 76.0 in | Wt 197.0 lb

## 2024-01-06 DIAGNOSIS — S70362A Insect bite (nonvenomous), left thigh, initial encounter: Secondary | ICD-10-CM

## 2024-01-06 DIAGNOSIS — W57XXXA Bitten or stung by nonvenomous insect and other nonvenomous arthropods, initial encounter: Secondary | ICD-10-CM | POA: Diagnosis not present

## 2024-01-06 DIAGNOSIS — J029 Acute pharyngitis, unspecified: Secondary | ICD-10-CM | POA: Diagnosis not present

## 2024-01-06 DIAGNOSIS — R509 Fever, unspecified: Secondary | ICD-10-CM | POA: Diagnosis not present

## 2024-01-06 LAB — CBC WITH DIFFERENTIAL/PLATELET
Basophils Absolute: 0 10*3/uL (ref 0.0–0.2)
Basos: 1 %
EOS (ABSOLUTE): 0.1 10*3/uL (ref 0.0–0.4)
Eos: 2 %
Hematocrit: 40.5 % (ref 37.5–51.0)
Hemoglobin: 13.5 g/dL (ref 13.0–17.7)
Immature Grans (Abs): 0 10*3/uL (ref 0.0–0.1)
Immature Granulocytes: 0 %
Lymphocytes Absolute: 1 10*3/uL (ref 0.7–3.1)
Lymphs: 22 %
MCH: 30.5 pg (ref 26.6–33.0)
MCHC: 33.3 g/dL (ref 31.5–35.7)
MCV: 92 fL (ref 79–97)
Monocytes Absolute: 0.6 10*3/uL (ref 0.1–0.9)
Monocytes: 12 %
Neutrophils Absolute: 3.1 10*3/uL (ref 1.4–7.0)
Neutrophils: 63 %
Platelets: 231 10*3/uL (ref 150–450)
RBC: 4.42 x10E6/uL (ref 4.14–5.80)
RDW: 12.7 % (ref 11.6–15.4)
WBC: 4.8 10*3/uL (ref 3.4–10.8)

## 2024-01-06 LAB — RAPID STREP SCREEN (MED CTR MEBANE ONLY): Strep Gp A Ag, IA W/Reflex: NEGATIVE

## 2024-01-06 LAB — CULTURE, GROUP A STREP

## 2024-01-06 MED ORDER — AZITHROMYCIN 250 MG PO TABS: ORAL_TABLET | ORAL | 0 refills | Status: AC

## 2024-01-06 NOTE — Progress Notes (Unsigned)
 Acute Office Visit  Subjective:     Patient ID: Charles Rasmussen, male    DOB: 1970-08-22, 53 y.o.   MRN: [id number]  Chief Complaint  Patient presents with   URI    Sore throat and cough. Fever started last pm    HPI Charles Rasmussen is a 53 year old male presenting on 01/06/2024 for an acute visit with concerns of a cough, resolved fever, scratchy throat, and a rash on the left thigh. The patient reports that symptoms began last Friday with a scratchy throat. Last night, he developed a temperature of 100F, which resolved after taking Tylenol . He is currently experiencing fatigue but reports a normal appetite. The cough is productive with clear mucus. The patient [place] an unidentified insect--possibly a tick, spider, or mosquito. He is concerned about the potential for infection from insect bites, particularly from mosquitoes that may carry diseases.   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 Negative for chills.       100 resolved with tylenol   HENT:  Positive for congestion and sore throat.        Scratchy  Respiratory:  Positive for cough and sputum production.        Clear  Cardiovascular:  Negative for chest pain and leg swelling.  Gastrointestinal:  Negative for abdominal pain, diarrhea, nausea and vomiting.  Musculoskeletal:  Negative for myalgias.  Skin:  Positive for rash.       Left thigh from insect bite  Neurological:  Negative for dizziness and headaches.   Negative unless indicated in HPI    Objective:    BP 95/60   Pulse 94   Temp (!) 97 F (36.1 C)   Ht 6' 4 (1.93 m)   Wt 197 lb (89.4 kg)   SpO2 95%   BMI 23.98 kg/m  BP Readings from Last 3 Encounters:  01/06/24 95/60  12/11/23 108/66  12/01/23 110/78   Wt Readings from Last 3 Encounters:  01/06/24 197 lb (89.4 kg)  12/11/23 201 lb (91.2 kg)  12/01/23 202 lb (91.6 kg)      Physical Exam Vitals and nursing note reviewed.  Constitutional:      General: He is not in acute distress.    Appearance: Normal appearance.  HENT:     Head: Normocephalic and atraumatic.     Right Ear: Tympanic membrane, ear canal and external ear normal. There is no impacted cerumen.     Left Ear:  Tympanic membrane, ear canal and external ear normal. There is no impacted cerumen.     Mouth/Throat:     Lips: Pink.     Mouth: Mucous membranes are moist.     Pharynx: Uvula midline. Pharyngeal swelling, posterior oropharyngeal erythema and postnasal drip present.   Cardiovascular:     Heart sounds: Normal heart sounds.  Pulmonary:     Effort: Pulmonary effort is normal.     Breath sounds: Normal breath sounds.   Musculoskeletal:        General: Normal range of motion.     Right lower leg: No edema.     Left lower leg: No edema.   Skin:    General: Skin is warm and dry.     Capillary Refill: Capillary refill takes less than 2 seconds.     Findings: Rash present.      Comments: Left thigh   Neurological:     General: No focal deficit present.     Mental Status: He is alert.   Psychiatric:        Mood and Affect: Mood normal.        Behavior: Behavior normal.        Thought Content: Thought content normal.        Judgment: Judgment normal.     Results for orders placed or performed in visit on 01/06/24  Rapid Strep Screen ([facility] ONLY)   Specimen: Other   Other  Result Value Ref Range   Strep Gp A Ag, IA W/Reflex Negative Negative  Culture, Group A Strep   Other  Result Value Ref Range   Strep A Culture CANCELED   CBC with Differential  Result Value Ref Range   WBC 4.8 3.4 - 10.8 x10E3/uL   RBC 4.42 4.14 - 5.80 x10E6/uL   Hemoglobin 13.5 13.0 - 17.7 g/dL   Hematocrit 59.4 62.4 - 51.0 %   MCV 92 79 - 97 fL   MCH 30.5 26.6 - 33.0 pg   MCHC 33.3 31.5 - 35.7 g/dL   RDW 87.2 88.3 - 84.5 %   Platelets 231 150 - 450 x10E3/uL   Neutrophils 63 Not Estab. %   Lymphs 22 Not Estab. %   Monocytes 12 Not Estab. %   Eos 2 Not Estab. %   Basos 1 Not Estab. %   Neutrophils Absolute 3.1 1.4 - 7.0 x10E3/uL   Lymphocytes Absolute 1.0 0.7 - 3.1 x10E3/uL   Monocytes Absolute 0.6 0.1 - 0.9 x10E3/uL   EOS (ABSOLUTE) 0.1 0.0 - 0.4 x10E3/uL   Basophils Absolute 0.0 0.0 - 0.2 x10E3/uL   Immature Granulocytes 0 Not Estab. %   Immature Grans (Abs) 0.0 0.0 - 0.1 x10E3/uL        Assessment & Plan:  Fever, unspecified fever cause -     Rapid Strep Screen ([facility] ONLY) -     CBC with Differential/Platelet -     Azithromycin; Take 2 tablets on day 1, then 1 tablet daily on days 2 through 5  Dispense: 6 tablet; Refill: 0  Sorethroat -     Rapid Strep Screen ([facility] ONLY) -     Azithromycin; Take 2 tablets on day 1, then 1 tablet daily on days 2 through 5  Dispense: 6 tablet; Refill: 0  Insect bite of left thigh, initial encounter -     CBC with Differential/Platelet -     Azithromycin; Take 2 tablets on day 1, then 1 tablet daily  on days 2 through 5  Dispense: 6 tablet; Refill: 0  Other orders -     Culture, Group A Strep   Charles Rasmussen 53 year old Caucasian male seen today for URI symptoms, no acute distress Strep negative however will treat based on clinical presentation with Zithromax No. 6 dispense Labs: CBC result pending Education -Monitor for progression of symptoms: spreading rash, fever >101F, fatigue, muscle aches, headaches, or neurological changes -If rash becomes painful, shows signs of infection warmth, pus, increased redness, or if systemic symptoms develop, return to clinic. -Avoid scratching the rash to reduce risk of secondary infection. -Watch for signs of tick-borne illnesses (e.g., bull's-eye rash, flu-like symptoms, joint pain). -Use insect repellent when outdoors, especially in grassy or wooded areas. -Call the office if experiencing  persistent fever, increasing fatigue, or new rash patterns.  The above assessment and management plan was discussed with the patient. The patient verbalized understanding of and has agreed to the management plan. Patient is aware to call the clinic if they develop any new symptoms or if symptoms persist or worsen. Patient is aware when to return to the clinic for a follow-up visit. Patient educated on when it is appropriate to go to the emergency department.  Return if symptoms worsen or fail to improve.  Patsey Pitstick [place] Thompson, DNP [facility] [address] [place], [place] [address] [phone]  Note: This document was prepared by [employer] voice dictation technology and any errors that results from this process are unintentional.

## 2024-01-07 ENCOUNTER — Ambulatory Visit: Payer: Self-pay | Admitting: Nurse Practitioner

## 2024-01-08 ENCOUNTER — Telehealth: Payer: Self-pay

## 2024-01-08 NOTE — Telephone Encounter (Signed)
 Copied from CRM [id number]. Topic: General - Other >> Jan 08, 2024  1:28 PM Santiya F wrote: Reason for CRM: Patient is calling in because he was treated for strep throat throat on 01/06/24 and wanted to know if he is still contagious. He is planning on meeting with friends this weekend and wanted to know if he would be okay to do so. Please follow up with patient.

## 2024-01-11 NOTE — Telephone Encounter (Signed)
 Pt states that he has improved but still has congestion and a productive cough. He thinks something else maybe going on with him. Completed ATB 6/29. Pt wants to be rechecked. Appt scheduled with Nena 7/3 at 10:35am.

## 2024-01-14 ENCOUNTER — Ambulatory Visit

## 2024-01-18 ENCOUNTER — Ambulatory Visit: Admitting: Nurse Practitioner

## 2024-01-20 ENCOUNTER — Encounter: Payer: Self-pay | Admitting: Family Medicine

## 2024-01-21 ENCOUNTER — Ambulatory Visit (HOSPITAL_BASED_OUTPATIENT_CLINIC_OR_DEPARTMENT_OTHER)

## 2024-01-21 ENCOUNTER — Ambulatory Visit (HOSPITAL_BASED_OUTPATIENT_CLINIC_OR_DEPARTMENT_OTHER): Admitting: Student

## 2024-01-21 ENCOUNTER — Encounter (HOSPITAL_BASED_OUTPATIENT_CLINIC_OR_DEPARTMENT_OTHER): Payer: Self-pay | Admitting: Student

## 2024-01-21 ENCOUNTER — Ambulatory Visit

## 2024-01-21 DIAGNOSIS — M79672 Pain in left foot: Secondary | ICD-10-CM

## 2024-01-21 DIAGNOSIS — S92512A Displaced fracture of proximal phalanx of left lesser toe(s), initial encounter for closed fracture: Secondary | ICD-10-CM

## 2024-01-21 NOTE — Progress Notes (Signed)
 Chief Complaint: Left toe injury     History of Present Illness:    Charles Rasmussen is a 53 y.o. male who presents today for evaluation of a left toe injury.  Patient reports that last night his left foot ran into a coffee table.  He subsequently developed swelling and bruising around the base of the third toe, and states that at one point his third toe appeared crooked.  Pain levels are overall mild today however are worsened with weightbearing.  He has tried taping but denies any other treatments or pain medication.   Surgical History:   None  PMH/PSH/Family History/Social History/Meds/Allergies:    Past Medical History:  Diagnosis Date   Anxiety    Atrial fibrillation (HCC)    Hemorrhoid thrombosis    Mitral valve prolapse    Unspecified hemorrhoids without mention of complication 2006   Venous reflux 01/02/12   deep right per Lynwood BIRCH. Gerlean, M.D.   Past Surgical History:  Procedure Laterality Date   COLONOSCOPY     MOUTH SURGERY     wisdom teeth   Social History   Socioeconomic History   Marital status: Single    Spouse name: Not on file   Number of children: 0   Years of education: Not on file   Highest education level: Not on file  Occupational History    Employer: [employer]  Tobacco Use   Smoking status: Never   Smokeless tobacco: Never  Vaping Use   Vaping status: Never Used  Substance and Sexual Activity   Alcohol use: No   Drug use: No   Sexual activity: Not on file  Other Topics Concern   Not on file  Social History Narrative   He is single, no children, 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   No alcohol tobacco or drug use   Social Drivers of Corporate investment banker Strain: Not on file  Food Insecurity: Not on file  Transportation Needs: Not on file  Physical Activity: Not on file  Stress: Not on file  Social Connections: Not on file   Family History   Problem Relation Age of Onset   Colon cancer Father 74   Rectal cancer Father    Hypertension Father    Stroke Mother    Colon cancer Maternal Grandfather 3   Rectal cancer Paternal Grandfather    Heart attack Paternal Aunt    Pancreatic cancer Paternal Uncle    No Known Allergies Current Outpatient Medications  Medication Sig Dispense Refill   diltiazem  (CARDIZEM ) 30 MG tablet TAKE 1 TABLET BY MOUTH EVERY 4 HOURS AS NEEDED FOR AFIB HEART RATE > 100, AS LONG AS BLOOD PRESSURE IS >100 90 tablet 2   metoprolol  tartrate (LOPRESSOR ) 25 MG tablet Take 1/2 (one-half) tablet by mouth twice daily. 90 tablet 3   rosuvastatin  (CRESTOR ) 5 MG tablet Take 1 tablet (5 mg total) by mouth at bedtime as needed. 90 tablet 3   No current facility-administered medications for this visit.   No results found.  Review of Systems:   A ROS was performed including pertinent positives and negatives as documented in the HPI.  Physical Exam :   Constitutional: NAD and appears stated age Neurological: Alert and oriented Psych: Appropriate affect and cooperative There were  no vitals taken for this visit.   Comprehensive Musculoskeletal Exam:    Exam of the left foot demonstrates mild swelling around the base of the third toe without evidence of obvious deformity.  Ecchymosis is seen circumferentially around the base of the third toe.  He is able to perform active toe flexion extension with mild discomfort.  Dorsalis pedis 2+.  Distal neurosensory exam intact.  Imaging:   Xray (left foot 3 views): Mildly displaced fracture of the third proximal phalanx.  No other acute fracture or dislocation seen.   I personally reviewed and interpreted the radiographs.   Assessment:   53 y.o. male with an injury to the left third toe that occurred last night.  X-rays taken today show a mildly displaced third proximal phalanx fracture.  Overall the alignment is well-appearing therefore conservative management will be  preferred without the need for closed reduction or surgical intervention.  I have recommended a surgical shoe for immobilization as well as buddy taping to help prevent any further displacement.  Will plan to have him follow-up in another 4 weeks to reevaluate on x-ray and to ensure proper healing.  Plan :    - Return to clinic in 4 weeks for repeat x-ray and reassessment     I personally saw and evaluated the patient, and participated in the management and treatment plan.  Leonce Reveal, PA-C Orthopedics

## 2024-01-21 NOTE — Progress Notes (Signed)
 Patient was seen by Leonce this AM. He came to [facility]  [address] to get fitted for XL post op shoe. Fitted and signed for DME.

## 2024-01-22 ENCOUNTER — Ambulatory Visit (INDEPENDENT_AMBULATORY_CARE_PROVIDER_SITE_OTHER): Payer: 59 | Admitting: Family Medicine

## 2024-01-22 ENCOUNTER — Encounter: Payer: Self-pay | Admitting: Family Medicine

## 2024-01-22 VITALS — BP 138/68 | HR 84 | Ht 76.0 in | Wt 193.0 lb

## 2024-01-22 DIAGNOSIS — Z23 Encounter for immunization: Secondary | ICD-10-CM | POA: Diagnosis not present

## 2024-01-22 DIAGNOSIS — I480 Paroxysmal atrial fibrillation: Secondary | ICD-10-CM | POA: Diagnosis not present

## 2024-01-22 DIAGNOSIS — E782 Mixed hyperlipidemia: Secondary | ICD-10-CM

## 2024-01-22 DIAGNOSIS — Z0001 Encounter for general adult medical examination with abnormal findings: Secondary | ICD-10-CM | POA: Diagnosis not present

## 2024-01-22 DIAGNOSIS — I10 Essential (primary) hypertension: Secondary | ICD-10-CM

## 2024-01-22 DIAGNOSIS — Z0000 Encounter for general adult medical examination without abnormal findings: Secondary | ICD-10-CM

## 2024-01-22 NOTE — Progress Notes (Signed)
 BP 138/68   Pulse 84   Ht 6' 4 (1.93 m)   Wt 193 lb (87.5 kg)   SpO2 98%   BMI 23.49 kg/m    Subjective:   Patient ID: Charles Rasmussen, male    DOB: [birth year], 53 y.o.   MRN: [id number]  HPI: Charles Rasmussen is a 53 y.o. male presenting on 01/22/2024 for Medical Management of Chronic Issues, Hyperlipidemia, and No taste   HPI Physical exam Patient denies any chest pain, shortness of breath, headaches or vision issues, abdominal complaints, diarrhea, nausea, vomiting, or joint issues.   Hyperlipidemia Patient is coming in for recheck of his hyperlipidemia. The patient is currently taking Crestor . They deny any issues with myalgias or history of liver damage from it. They deny any focal numbness or weakness or chest pain.   Hypertension and A-fib recheck Patient is currently on diltiazem  and metoprolol , and their blood pressure today is 108/72. Patient denies any lightheadedness or dizziness. Patient denies headaches, blurred vision, chest pains, shortness of breath, or weakness. Denies any side effects from medication and is content with current medication.   Relevant past medical, surgical, family and social history reviewed and updated as indicated. Interim medical history since our last visit reviewed. Allergies and medications reviewed and updated.  Review of Systems  Constitutional:  Negative for chills and fever.  Eyes:  Negative for visual disturbance.  Respiratory:  Negative for shortness of breath and wheezing.   Cardiovascular:  Negative for chest pain and leg swelling.  Musculoskeletal:  Negative for back pain and gait problem.  Skin:  Negative for rash.  Neurological:  Negative for dizziness and light-headedness.  All other systems reviewed and are negative.   Per HPI unless specifically indicated above   Allergies as of 01/22/2024   No Known Allergies      Medication List        Accurate as of January 22, 2024 11:39 AM. If you have any questions, ask your  nurse or doctor.          diltiazem  30 MG tablet Commonly known as: CARDIZEM  TAKE 1 TABLET BY MOUTH EVERY 4 HOURS AS NEEDED FOR AFIB HEART RATE > 100, AS LONG AS BLOOD PRESSURE IS >100   metoprolol  tartrate 25 MG tablet Commonly known as: LOPRESSOR  Take 1/2 (one-half) tablet by mouth twice daily.   rosuvastatin  5 MG tablet Commonly known as: Crestor  Take 1 tablet (5 mg total) by mouth at bedtime as needed.         Objective:   BP 138/68   Pulse 84   Ht 6' 4 (1.93 m)   Wt 193 lb (87.5 kg)   SpO2 98%   BMI 23.49 kg/m   Wt Readings from Last 3 Encounters:  01/22/24 193 lb (87.5 kg)  01/06/24 197 lb (89.4 kg)  12/11/23 201 lb (91.2 kg)    Physical Exam Vitals and nursing note reviewed.  Constitutional:      General: He is not in acute distress.    Appearance: He is well-developed. He is not diaphoretic.  HENT:     Right Ear: External ear normal.     Left Ear: External ear normal.     Nose: Nose normal.     Mouth/Throat:     Pharynx: No oropharyngeal exudate.  Eyes:     General: No scleral icterus.       Right eye: No discharge.     Conjunctiva/sclera: Conjunctivae normal.     Pupils: Pupils  are equal, round, and reactive to light.  Neck:     Thyroid : No thyromegaly.  Cardiovascular:     Rate and Rhythm: Normal rate and regular rhythm.     Heart sounds: Normal heart sounds. No murmur heard. Pulmonary:     Effort: Pulmonary effort is normal. No respiratory distress.     Breath sounds: Normal breath sounds. No wheezing.  Abdominal:     General: Bowel sounds are normal. There is no distension.     Palpations: Abdomen is soft.     Tenderness: There is no abdominal tenderness. There is no guarding or rebound.  Musculoskeletal:        General: Normal range of motion.     Cervical back: Neck supple.  Lymphadenopathy:     Cervical: No cervical adenopathy.  Skin:    General: Skin is warm and dry.     Findings: No rash.  Neurological:     Mental Status: He  is alert and oriented to person, place, and time.     Coordination: Coordination normal.  Psychiatric:        Behavior: Behavior normal.       Assessment & Plan:   Problem List Items Addressed This Visit       Cardiovascular and Mediastinum   Hypertensive disorder   Relevant Orders   CMP14+EGFR   A-fib (HCC)   Relevant Orders   CBC with Differential/Platelet     Other   Hyperlipidemia   Relevant Orders   Lipid panel   Other Visit Diagnoses       Physical exam    -  Primary   Relevant Orders   CBC with Differential/Platelet   CMP14+EGFR   Lipid panel   PSA, total and free     Encounter for immunization       Relevant Orders   Pneumococcal conjugate vaccine 20-valent (Completed)   Varicella-zoster vaccine IM (Completed)       Everything looks good on blood pressure, will do blood work today.  He seems to be doing well.  He is recovering from a foot fracture and has a boot on but it is healing. Follow up plan: Return in about 6 months (around 07/24/2024), or if symptoms worsen or fail to improve, for Hyperlipidemia and hypertension.  Counseling provided for all of the vaccine components Orders Placed This Encounter  Procedures   Pneumococcal conjugate vaccine 20-valent   Varicella-zoster vaccine IM   CBC with Differential/Platelet   CMP14+EGFR   Lipid panel   PSA, total and free    Fonda Levins, MD [facility] 01/22/2024, 11:39 AM

## 2024-01-23 LAB — LIPID PANEL
Chol/HDL Ratio: 2.5 ratio (ref 0.0–5.0)
Cholesterol, Total: 144 mg/dL (ref 100–199)
HDL: 58 mg/dL (ref >39)
LDL Chol Calc (NIH): 70 mg/dL (ref 0–99)
Triglycerides: 82 mg/dL (ref 0–149)
VLDL Cholesterol Cal: 16 mg/dL (ref 5–40)

## 2024-01-23 LAB — CMP14+EGFR
ALT: 17 IU/L (ref 0–44)
AST: 22 IU/L (ref 0–40)
Albumin: 4.2 g/dL (ref 3.8–4.9)
Alkaline Phosphatase: 81 IU/L (ref 44–121)
BUN/Creatinine Ratio: 11 (ref 9–20)
BUN: 11 mg/dL (ref 6–24)
Bilirubin Total: 0.7 mg/dL (ref 0.0–1.2)
CO2: 20 mmol/L (ref 20–29)
Calcium: 9.4 mg/dL (ref 8.7–10.2)
Chloride: 103 mmol/L (ref 96–106)
Creatinine, Ser: 0.98 mg/dL (ref 0.76–1.27)
Globulin, Total: 2.6 g/dL (ref 1.5–4.5)
Glucose: 91 mg/dL (ref 70–99)
Potassium: 4.1 mmol/L (ref 3.5–5.2)
Sodium: 138 mmol/L (ref 134–144)
Total Protein: 6.8 g/dL (ref 6.0–8.5)
eGFR: 92 mL/min/1.73 (ref >59)

## 2024-01-23 LAB — CBC WITH DIFFERENTIAL/PLATELET
Basophils Absolute: 0.1 x10E3/uL (ref 0.0–0.2)
Basos: 1 %
EOS (ABSOLUTE): 0.1 x10E3/uL (ref 0.0–0.4)
Eos: 1 %
Hematocrit: 43.4 % (ref 37.5–51.0)
Hemoglobin: 14 g/dL (ref 13.0–17.7)
Immature Grans (Abs): 0 x10E3/uL (ref 0.0–0.1)
Immature Granulocytes: 0 %
Lymphocytes Absolute: 1.5 x10E3/uL (ref 0.7–3.1)
Lymphs: 29 %
MCH: 29.5 pg (ref 26.6–33.0)
MCHC: 32.3 g/dL (ref 31.5–35.7)
MCV: 92 fL (ref 79–97)
Monocytes Absolute: 0.5 x10E3/uL (ref 0.1–0.9)
Monocytes: 9 %
Neutrophils Absolute: 3 x10E3/uL (ref 1.4–7.0)
Neutrophils: 59 %
Platelets: 320 x10E3/uL (ref 150–450)
RBC: 4.74 x10E6/uL (ref 4.14–5.80)
RDW: 12.7 % (ref 11.6–15.4)
WBC: 5.1 x10E3/uL (ref 3.4–10.8)

## 2024-01-23 LAB — PSA, TOTAL AND FREE
PSA, Free Pct: 19.5 %
PSA, Free: 0.41 ng/mL
Prostate Specific Ag, Serum: 2.1 ng/mL (ref 0.0–4.0)

## 2024-01-25 ENCOUNTER — Telehealth (HOSPITAL_BASED_OUTPATIENT_CLINIC_OR_DEPARTMENT_OTHER): Payer: Self-pay | Admitting: Student

## 2024-01-25 NOTE — Telephone Encounter (Signed)
 Patient would like some to call him about his visit from last week  [phone]

## 2024-01-25 NOTE — Telephone Encounter (Signed)
 Returned patients call.

## 2024-01-29 ENCOUNTER — Ambulatory Visit (HOSPITAL_BASED_OUTPATIENT_CLINIC_OR_DEPARTMENT_OTHER): Admitting: Student

## 2024-01-29 ENCOUNTER — Ambulatory Visit: Payer: Self-pay | Admitting: Family Medicine

## 2024-01-29 ENCOUNTER — Ambulatory Visit (HOSPITAL_BASED_OUTPATIENT_CLINIC_OR_DEPARTMENT_OTHER)

## 2024-01-29 ENCOUNTER — Encounter (HOSPITAL_BASED_OUTPATIENT_CLINIC_OR_DEPARTMENT_OTHER): Payer: Self-pay | Admitting: Student

## 2024-01-29 DIAGNOSIS — S92512A Displaced fracture of proximal phalanx of left lesser toe(s), initial encounter for closed fracture: Secondary | ICD-10-CM

## 2024-01-29 NOTE — Progress Notes (Signed)
 Chief Complaint: Left toe injury     History of Present Illness:   01/29/24: Patient presents to clinic today for recheck of his left toe fracture.  He has been utilizing a postop shoe and overall states that pain has been improving and is now minimal.  Has also been utilizing buddy taping.  Does report that he had a mild injury a few days ago while in the pool and thought his toe looked a little bit more crooked and would like to ensure that it will heal properly.   01/21/24: Kenn TOWNES FUHS is a 53 y.o. male who presents today for evaluation of a left toe injury.  Patient reports that last night his left foot ran into a coffee table.  He subsequently developed swelling and bruising around the base of the third toe, and states that at one point his third toe appeared crooked.  Pain levels are overall mild today however are worsened with weightbearing.  He has tried taping but denies any other treatments or pain medication.   Surgical History:   None  PMH/PSH/Family History/Social History/Meds/Allergies:    Past Medical History:  Diagnosis Date   Anxiety    Atrial fibrillation (HCC)    Hemorrhoid thrombosis    Mitral valve prolapse    Unspecified hemorrhoids without mention of complication 2006   Venous reflux 01/02/12   deep right per Lynwood BIRCH. Gerlean, M.D.   Past Surgical History:  Procedure Laterality Date   COLONOSCOPY     MOUTH SURGERY     wisdom teeth   Social History   Socioeconomic History   Marital status: Single    Spouse name: Not on file   Number of children: 0   Years of education: Not on file   Highest education level: Not on file  Occupational History    Employer: [employer]  Tobacco Use   Smoking status: Never   Smokeless tobacco: Never  Vaping Use   Vaping status: Never Used  Substance and Sexual Activity   Alcohol use: No   Drug use: No   Sexual activity: Not on file  Other Topics Concern   Not on file   Social History Narrative   He is single, no children, 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   No alcohol tobacco or drug use   Social Drivers of Corporate investment banker Strain: Not on file  Food Insecurity: Not on file  Transportation Needs: Not on file  Physical Activity: Not on file  Stress: Not on file  Social Connections: Not on file   Family History  Problem Relation Age of Onset   Colon cancer Father 55   Rectal cancer Father    Hypertension Father    Stroke Mother    Colon cancer Maternal Grandfather 63   Rectal cancer Paternal Grandfather    Heart attack Paternal Aunt    Pancreatic cancer Paternal Uncle    No Known Allergies Current Outpatient Medications  Medication Sig Dispense Refill   diltiazem  (CARDIZEM ) 30 MG tablet TAKE 1 TABLET BY MOUTH EVERY 4 HOURS AS NEEDED FOR AFIB HEART RATE > 100, AS LONG AS BLOOD PRESSURE IS >100 90 tablet 2   metoprolol  tartrate (LOPRESSOR ) 25 MG tablet Take 1/2 (one-half) tablet by mouth twice daily. 90 tablet 3  rosuvastatin  (CRESTOR ) 5 MG tablet Take 1 tablet (5 mg total) by mouth at bedtime as needed. 90 tablet 3   No current facility-administered medications for this visit.   No results found.  Review of Systems:   A ROS was performed including pertinent positives and negatives as documented in the HPI.  Physical Exam :   Constitutional: NAD and appears stated age Neurological: Alert and oriented Psych: Appropriate affect and cooperative There were no vitals taken for this visit.   Comprehensive Musculoskeletal Exam:    Patient continues to demonstrate ecchymosis around the base of the third toe of the left foot.  No visible deformity is noted.  There is mild residual swelling and tenderness around this area.  Flexor and extensor mechanisms are intact.  Brisk capillary refill distally.  DP pulse 2+.  Imaging:   Xray (left foot 3 views): No change in displacement or increase in  healing seen of third proximal phalanx fracture.   I personally reviewed and interpreted the radiographs.   Assessment:   53 y.o. male now approximately 10 days status post mildly displaced fracture to the third proximal phalanx of the left foot.  Patient is following up to ensure that there are no changes in fracture displacement, which upon review of x-rays repeated today there are no significant changes.  He is symptomatically doing very well.  We will continue to follow as planned with subsequent x-rays to ensure proper healing.  Patient did also express concerns of symptoms that sound consistent with left elbow lateral epicondylitis and potentially an early developing right index trigger finger.  Discussed conservative measures that he can try for these but that we can also follow-up on either at any time.   Plan :    - Return to clinic as scheduled on 8/25, sooner if needed     I personally saw and evaluated the patient, and participated in the management and treatment plan.  Leonce Reveal, PA-C Orthopedics

## 2024-02-19 ENCOUNTER — Ambulatory Visit (HOSPITAL_BASED_OUTPATIENT_CLINIC_OR_DEPARTMENT_OTHER): Admitting: Physician Assistant

## 2024-02-19 ENCOUNTER — Encounter (HOSPITAL_BASED_OUTPATIENT_CLINIC_OR_DEPARTMENT_OTHER): Payer: Self-pay | Admitting: Physician Assistant

## 2024-02-19 ENCOUNTER — Ambulatory Visit (INDEPENDENT_AMBULATORY_CARE_PROVIDER_SITE_OTHER)

## 2024-02-19 DIAGNOSIS — S92512A Displaced fracture of proximal phalanx of left lesser toe(s), initial encounter for closed fracture: Secondary | ICD-10-CM

## 2024-02-19 NOTE — Progress Notes (Signed)
 Office Visit Note   Patient: Charles Rasmussen           Date of Birth: [birth year]           MRN: [id number] Visit Date: 02/19/2024              Requested by: Dettinger, Fonda LABOR, MD [address] [place],  [place] [address] PCP: Dettinger, Fonda LABOR, MD  No chief complaint on file.     HPI: Patient is a pleasant 54 year old gentleman who has been seen by Leonce.  He is here in follow-up for a third phalanx fracture in his left foot.  He has been in a postop shoe he does feel like he is doing better.  Assessment & Plan: Visit Diagnoses:  1. Closed displaced fracture of proximal phalanx of lesser toe of left foot, initial encounter     Plan: X-rays did not show any movement of the fracture he does have some early callus healing.  He is minimally tender to palpation.  Will continue buddy taping and the postop shoe for another week.  At that point he could try a stiff supportive shoe.  He understands if he has any pain with a shoe he should go back in the regular postop shoe.  Should follow-up in 3 weeks for final x-rays  Follow-Up Instructions: Return in about 3 weeks (around 03/11/2024).   Ortho Exam  Patient is alert, oriented, no adenopathy, well-dressed, normal affect, normal respiratory effort. Examination of his left foot minimal soft tissue swelling resolving ecchymosis no cellulitis he does have some tenderness over the proximal phalanx of the third toe.  Brisk capillary refill    Imaging: No results found. No images are attached to the encounter.  Labs: Lab Results  Component Value Date   LABORGA NO GROWTH 09/27/2012     Lab Results  Component Value Date   ALBUMIN 4.2 01/22/2024   ALBUMIN 4.3 07/01/2023   ALBUMIN 4.3 07/24/2022    Lab Results  Component Value Date   MG 2.0 09/03/2016   Lab Results  Component Value Date   VD25OH 27.3 (L) 01/18/2019   VD25OH 23.4 (L) 03/05/2017    No results found for: PREALBUMIN    Latest Ref Rng & Units 01/22/2024    11:42 AM 01/06/2024    3:48 PM 07/01/2023    8:37 AM  CBC EXTENDED  WBC 3.4 - 10.8 x10E3/uL 5.1  4.8  5.1   RBC 4.14 - 5.80 x10E6/uL 4.74  4.42  4.72   Hemoglobin 13.0 - 17.7 g/dL 85.9  86.4  85.6   HCT 37.5 - 51.0 % 43.4  40.5  43.0   Platelets 150 - 450 x10E3/uL 320  231  282   NEUT# 1.4 - 7.0 x10E3/uL 3.0  3.1  3.0   Lymph# 0.7 - 3.1 x10E3/uL 1.5  1.0  1.4      There is no height or weight on file to calculate BMI.  Orders:  Orders Placed This Encounter  Procedures   DG Foot Complete Left   No orders of the defined types were placed in this encounter.    Procedures: No procedures performed  Clinical Data: No additional findings.  ROS:  All other systems negative, except as noted in the HPI. Review of Systems  Objective: Vital Signs: There were no vitals taken for this visit.  Specialty Comments:  No specialty comments available.  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 06/24/2023   Family history of colon cancer 04/19/2020   A-fib (HCC) 10/08/2019   Hypertensive disorder 08/26/2019   PVC (premature ventricular contraction) 02/19/2014   Pectus excavatum 11/07/2013   Chronic prostatitis 10/28/2012   Hyperlipidemia 10/28/2012   MVP (mitral valve prolapse) 10/28/2012   Varicose veins of bilateral lower extremities with other complications 12/30/2011   Past Medical History:  Diagnosis Date   Anxiety    Atrial fibrillation (HCC)    Hemorrhoid thrombosis    Mitral valve prolapse    Unspecified hemorrhoids without mention of complication 2006   Venous reflux 01/02/12   deep right per Lynwood D. Gerlean, M.D.    Family History  Problem Relation Age of Onset   Colon cancer Father 75   Rectal cancer Father    Hypertension Father    Stroke Mother    Colon cancer Maternal Grandfather 12   Rectal cancer Paternal Grandfather    Heart attack Paternal Aunt    Pancreatic cancer  Paternal Uncle     Past Surgical History:  Procedure Laterality Date   COLONOSCOPY     MOUTH SURGERY     wisdom teeth   Social History   Occupational History    Employer: [employer]  Tobacco Use   Smoking status: Never   Smokeless tobacco: Never  Vaping Use   Vaping status: Never Used  Substance and Sexual Activity   Alcohol use: No   Drug use: No   Sexual activity: Not on file

## 2024-03-07 ENCOUNTER — Ambulatory Visit (HOSPITAL_BASED_OUTPATIENT_CLINIC_OR_DEPARTMENT_OTHER): Admitting: Student

## 2024-03-10 ENCOUNTER — Encounter (INDEPENDENT_AMBULATORY_CARE_PROVIDER_SITE_OTHER): Payer: Self-pay | Admitting: Otolaryngology

## 2024-03-10 ENCOUNTER — Ambulatory Visit (INDEPENDENT_AMBULATORY_CARE_PROVIDER_SITE_OTHER): Admitting: Otolaryngology

## 2024-03-10 VITALS — BP 100/63 | HR 77

## 2024-03-10 DIAGNOSIS — R0981 Nasal congestion: Secondary | ICD-10-CM | POA: Diagnosis not present

## 2024-03-10 DIAGNOSIS — J3089 Other allergic rhinitis: Secondary | ICD-10-CM

## 2024-03-10 DIAGNOSIS — R432 Parageusia: Secondary | ICD-10-CM

## 2024-03-10 DIAGNOSIS — J342 Deviated nasal septum: Secondary | ICD-10-CM | POA: Diagnosis not present

## 2024-03-10 DIAGNOSIS — R438 Other disturbances of smell and taste: Secondary | ICD-10-CM | POA: Diagnosis not present

## 2024-03-10 DIAGNOSIS — R0982 Postnasal drip: Secondary | ICD-10-CM

## 2024-03-10 DIAGNOSIS — J343 Hypertrophy of nasal turbinates: Secondary | ICD-10-CM

## 2024-03-10 MED ORDER — FLUTICASONE PROPIONATE 50 MCG/ACT NA SUSP: 2.0000 | Freq: Two times a day (BID) | NASAL | 6 refills | Status: AC

## 2024-03-10 MED ORDER — LEVOCETIRIZINE DIHYDROCHLORIDE 5 MG PO TABS: 5.0000 mg | ORAL_TABLET | Freq: Every evening | ORAL | 3 refills | Status: AC

## 2024-03-10 NOTE — Progress Notes (Signed)
 ENT CONSULT:  Reason for Consult: dysgeusia    HPI: Discussed the use of AI scribe software for clinical note transcription with the patient, who gave verbal consent to proceed.  History of Present Illness Charles Rasmussen is a 53 year old male who presents with a changed sense of taste for a few months.  He has experienced a change in his sense of taste that began a few months ago. Initially, everything tasted sour, akin to having lemons in his mouth. This change occurred suddenly without any preceding illness such as a cold or upper respiratory infection. Although his sense of taste has improved somewhat since the onset, it remains muted.  No history of nasal congestion or allergies. He has not experienced any significant changes in his sense of smell, although he mentions never having had a great sense of smell. He started a statin approximately nine to ten months ago.  A brain MRI was recommended previously, but he has not undergone the procedure due to cost concerns.   Records Reviewed:  Office visit with Dr Dettinger  Olfactory changes Patient has been having some olfactory and taste changes recently.  He says he constantly has a sour taste in his mouth when he is eating and things taste different.  He also feels like he has had a loss of smell.  He says has been going on for a month or 2 and went saw the dentist and has been working on Administrator and has not improved.  He did change mouthwashes and that did not improve things.  He denies any sinus congestion or drainage or allergies.  He denies any acid reflux or heartburn or indigestion or belching or burping.  He just says it does not seem to be getting better      Past Medical History:  Diagnosis Date   Anxiety    Atrial fibrillation (HCC)    Hemorrhoid thrombosis    Mitral valve prolapse    Unspecified hemorrhoids without mention of complication 2006   Venous reflux 01/02/12   deep right per Lynwood BIRCH. Gerlean, M.D.    Past  Surgical History:  Procedure Laterality Date   COLONOSCOPY     MOUTH SURGERY     wisdom teeth    Family History  Problem Relation Age of Onset   Colon cancer Father 23   Rectal cancer Father    Hypertension Father    Stroke Mother    Colon cancer Maternal Grandfather 68   Rectal cancer Paternal Grandfather    Heart attack Paternal Aunt    Pancreatic cancer Paternal Uncle     Social History:  reports that he has never smoked. He has never used smokeless tobacco. He reports that he does not drink alcohol and does not use drugs.  Allergies: No Known Allergies  Medications: I have reviewed the patient's current medications.  The PMH, PSH, Medications, Allergies, and SH were reviewed and updated.  ROS: Constitutional: Negative for fever, weight loss and weight gain. Cardiovascular: Negative for chest pain and dyspnea on exertion. Respiratory: Is not experiencing shortness of breath at rest. Gastrointestinal: Negative for nausea and vomiting. Neurological: Negative for headaches. Psychiatric: The patient is not nervous/anxious  Blood pressure 100/63, pulse 77, SpO2 96%. There is no height or weight on file to calculate BMI.  PHYSICAL EXAM:  Exam: General: Well-developed, well-nourished Respiratory Respiratory effort: Equal inspiration and expiration without stridor Cardiovascular Peripheral Vascular: Warm extremities with equal color/perfusion Eyes: No nystagmus with equal extraocular motion bilaterally  Neuro/Psych/Balance: Patient oriented to person, place, and time; Appropriate mood and affect; Gait is intact with no imbalance; Cranial nerves I-XII are intact Head and Face Inspection: Normocephalic and atraumatic without mass or lesion Palpation: Facial skeleton intact without bony stepoffs Salivary Glands: No mass or tenderness Facial Strength: Facial motility symmetric and full bilaterally ENT Pinna: External ear intact and fully developed External canal: Canal is  patent with intact skin Tympanic Membrane: Clear and mobile External Nose: No scar or anatomic deformity Internal Nose: Septum is Deviated to the left and S-shaped with both sides being narrowed. No polyp, or purulence. Mucosal edema and erythema present.  Bilateral inferior turbinate hypertrophy.  Lips, Teeth, and gums: Mucosa and teeth intact and viable TMJ: No pain to palpation with full mobility Oral cavity/oropharynx: No erythema or exudate, no lesions present Nasopharynx: No mass or lesion with intact mucosa Hypopharynx: Intact mucosa without pooling of secretions Larynx Glottic: Full true vocal cord mobility without lesion or mass Supraglottic: Normal appearing epiglottis and AE folds Interarytenoid Space: Moderate pachydermia&edema Subglottic Space: Patent without lesion or edema Neck Neck and Trachea: Midline trachea without mass or lesion Thyroid : No mass or nodularity Lymphatics: No lymphadenopathy  Procedure:   PROCEDURE NOTE: nasal endoscopy  Preoperative diagnosis: chronic sinusitis symptoms  Postoperative diagnosis: same  Procedure: Diagnostic nasal endoscopy (68768)  Surgeon: Elena Larry, M.D.  Anesthesia: Topical lidocaine and Afrin  H&P REVIEW: The patient's history and physical were reviewed today prior to procedure. All medications were reviewed and updated as well. Complications: None Condition is stable throughout exam Indications and consent: The patient presents with symptoms of chronic sinusitis not responding to previous therapies. All the risks, benefits, and potential complications were reviewed with the patient preoperatively and informed consent was obtained. The time out was completed with confirmation of the correct procedure.   Procedure: The patient was seated upright in the clinic. Topical lidocaine and Afrin were applied to the nasal cavity. After adequate anesthesia had occurred, the rigid nasal endoscope was passed into the nasal  cavity. The nasal mucosa, turbinates, septum, and sinus drainage pathways were visualized bilaterally. This revealed no purulence or significant secretions that might be cultured. There were no polyps or sites of significant inflammation. The mucosa was intact and there was no crusting present. The scope was then slowly withdrawn and the patient tolerated the procedure well. There were no complications or blood loss.   Studies Reviewed: CXR 07/16/2023 FINDINGS: Cardiomediastinal silhouette unchanged in size and contour. No evidence of central vascular congestion. No interlobular septal thickening.   No pneumothorax or pleural effusion. Coarsened interstitial markings, with no confluent airspace disease.   No acute displaced fracture. Degenerative changes of the spine.   IMPRESSION: No active cardiopulmonary disease.  Assessment/Plan: Encounter Diagnoses  Name Primary?   Dysgeusia Yes   Chronic nasal congestion    Environmental and seasonal allergies    Post-nasal drip    Nasal septal deviation    Hypertrophy of both inferior nasal turbinates    Decreased sense of smell [R43.8]     Assessment and Plan Assessment & Plan Altered sense of taste and decreased sense of smell  Altered taste began months ago, initially sour. No smell changes but he has reduced sense of smell at baseline. No recent illness, or new medications except statin. Improvement noted. Differential includes chronic nasal congestion and medication side effects. Nasal endoscopy negative for masses, but with significant nasal congestion and septal deviation/ITH. MRI deferred due to cost and symptom improvement. - Monitor  taste or smell changes. - Consider MRI if symptoms worsen. - management of nasal congestion   Chronic Nasal congestion suspected environmental allergies  Congestion noted on nasal endoscopy exam, possibly affecting taste. Endoscopy negative for masses, showed septal deviation/ITH.  - Prescribe Xyzal  5  mg daily and Flonase  BID for congestion.   Deviated nasal septum ITH - Monitor for now and initiate medical management of chronic nasal congestion       Thank you for allowing me to participate in the care of this patient. Please do not hesitate to contact me with any questions or concerns.   Elena Larry, MD Otolaryngology [facility] Phone: [phone] Fax: [phone]    03/10/2024, 9:04 AM

## 2024-03-11 ENCOUNTER — Ambulatory Visit (HOSPITAL_BASED_OUTPATIENT_CLINIC_OR_DEPARTMENT_OTHER): Admitting: Student

## 2024-03-16 ENCOUNTER — Ambulatory Visit (HOSPITAL_BASED_OUTPATIENT_CLINIC_OR_DEPARTMENT_OTHER): Admitting: Student

## 2024-03-17 ENCOUNTER — Ambulatory Visit (HOSPITAL_BASED_OUTPATIENT_CLINIC_OR_DEPARTMENT_OTHER): Admitting: Student

## 2024-03-17 ENCOUNTER — Ambulatory Visit (INDEPENDENT_AMBULATORY_CARE_PROVIDER_SITE_OTHER)

## 2024-03-17 ENCOUNTER — Encounter (HOSPITAL_BASED_OUTPATIENT_CLINIC_OR_DEPARTMENT_OTHER): Payer: Self-pay | Admitting: Student

## 2024-03-17 DIAGNOSIS — S92512A Displaced fracture of proximal phalanx of left lesser toe(s), initial encounter for closed fracture: Secondary | ICD-10-CM | POA: Diagnosis not present

## 2024-03-17 NOTE — Progress Notes (Signed)
 Chief Complaint: Left toe injury     History of Present Illness:   Patient is a 53 year old male who presents today for follow-up evaluation of a left third toe fracture.  He has been able to transition back into regular shoes although he is still limiting the amount of motion going through his midfoot and toes.  States that he experiences some soreness when not wearing shoes, but otherwise symptoms and pain are very well-controlled.  Does also note some continued discomfort around the left elbow and has been trialing a counterforce strap as well as performing home exercises and stretches.   Surgical History:   None  PMH/PSH/Family History/Social History/Meds/Allergies:    Past Medical History:  Diagnosis Date   Anxiety    Atrial fibrillation (HCC)    Hemorrhoid thrombosis    Mitral valve prolapse    Unspecified hemorrhoids without mention of complication 2006   Venous reflux 01/02/12   deep right per [place] BIRCH. Gerlean, M.D.   Past Surgical History:  Procedure Laterality Date   COLONOSCOPY     MOUTH SURGERY     wisdom teeth   Social History   Socioeconomic History   Marital status: Single    Spouse name: Not on file   Number of children: 0   Years of education: Not on file   Highest education level: Not on file  Occupational History    Employer: [employer]  Tobacco Use   Smoking status: Never   Smokeless tobacco: Never  Vaping Use   Vaping status: Never Used  Substance and Sexual Activity   Alcohol use: No   Drug use: No   Sexual activity: Not on file  Other Topics Concern   Not on file  Social History Narrative   He is single, no children, 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   No alcohol tobacco or drug use   Social Drivers of Corporate investment banker Strain: Not on file  Food Insecurity: Not on file  Transportation Needs: Not on file  Physical Activity: Not on file   Stress: Not on file  Social Connections: Not on file   Family History  Problem Relation Age of Onset   Colon cancer Father 35   Rectal cancer Father    Hypertension Father    Stroke Mother    Colon cancer Maternal Grandfather 70   Rectal cancer Paternal Grandfather    Heart attack Paternal Aunt    Pancreatic cancer Paternal Uncle    No Known Allergies Current Outpatient Medications  Medication Sig Dispense Refill   diltiazem  (CARDIZEM ) 30 MG tablet TAKE 1 TABLET BY MOUTH EVERY 4 HOURS AS NEEDED FOR AFIB HEART RATE > 100, AS LONG AS BLOOD PRESSURE IS >100 90 tablet 2   fluticasone  (FLONASE ) 50 MCG/ACT nasal spray Place 2 sprays into both nostrils 2 (two) times daily. 16 g 6   levocetirizine (XYZAL  ALLERGY 24HR) 5 MG tablet Take 1 tablet (5 mg total) by mouth every evening. 30 tablet 3   metoprolol  tartrate (LOPRESSOR ) 25 MG tablet Take 1/2 (one-half) tablet by mouth twice daily. 90 tablet 3   rosuvastatin  (CRESTOR ) 5 MG tablet Take 1 tablet (5 mg total) by mouth at bedtime as needed. 90 tablet 3   No current facility-administered medications for this visit.  No results found.  Review of Systems:   A ROS was performed including pertinent positives and negatives as documented in the HPI.  Physical Exam :   Constitutional: NAD and appears stated age Neurological: Alert and oriented Psych: Appropriate affect and cooperative There were no vitals taken for this visit.   Comprehensive Musculoskeletal Exam:    Patient is ambulating with normal gait and regular footwear.  Flexor and extensor mechanisms to the toe are intact.  Mild residual tenderness.  No visible deformity.  Dorsalis pedis 2+.  Imaging:   Xray (left foot 3 views): Continued visualization of third proximal phalanx fracture with little progression of callus and bone formation   I personally reviewed and interpreted the radiographs.   Assessment:   53 y.o. male who suffered a fracture to his third proximal  phalanx of the left foot in early July.  On subsequent x-rays, the fracture has continued to demonstrate slow progression of healing although there is no further displacement or angulation.  He has been able to transition into a regular shoe but is concerned about worsening the injury.  I have recommended use of carbon fiber insoles to provide rigidity and support, but only would recommend return into postop shoe if symptoms suddenly worsen.  Will plan to follow-up on this fracture in another 6 to 8 weeks to ensure continued healing.  He also does experience persistent left elbow pain consistent with lateral epicondylitis.  He has been trying conservative therapies such as counterforce bracing, stretching, and home exercises.  Recommend follow-up if symptoms do not improve within the next few weeks and would likely consider cortisone injection.   Plan :    - Return to clinic in 6 to 8 weeks for follow-up and repeat x-rays     I personally saw and evaluated the patient, and participated in the management and treatment plan.  Leonce Reveal, PA-C Orthopedics

## 2024-05-05 ENCOUNTER — Ambulatory Visit (HOSPITAL_BASED_OUTPATIENT_CLINIC_OR_DEPARTMENT_OTHER)

## 2024-05-05 ENCOUNTER — Ambulatory Visit (HOSPITAL_BASED_OUTPATIENT_CLINIC_OR_DEPARTMENT_OTHER): Admitting: Student

## 2024-05-05 DIAGNOSIS — S92512A Displaced fracture of proximal phalanx of left lesser toe(s), initial encounter for closed fracture: Secondary | ICD-10-CM

## 2024-05-05 DIAGNOSIS — S92512G Displaced fracture of proximal phalanx of left lesser toe(s), subsequent encounter for fracture with delayed healing: Secondary | ICD-10-CM | POA: Diagnosis not present

## 2024-05-05 NOTE — Progress Notes (Unsigned)
 Chief Complaint: Left toe injury     History of Present Illness:   Patient is a 53 year old male who presents today for follow-up of a left third proximal phalanx fracture.  This injury initially occurred on 01/20/2024 after he hit his foot on a coffee table.  He has been treated conservatively with use of a postop shoe and buddy taping.  Today patient reports that he has been wearing normal shoes however continues to favor his right side as a result of the injury.  He denies any significant pain at this time.   Surgical History:   None  PMH/PSH/Family History/Social History/Meds/Allergies:    Past Medical History:  Diagnosis Date   Anxiety    Atrial fibrillation (HCC)    Hemorrhoid thrombosis    Mitral valve prolapse    Unspecified hemorrhoids without mention of complication 2006   Venous reflux 01/02/12   deep right per [place] BIRCH. Gerlean, M.D.   Past Surgical History:  Procedure Laterality Date   COLONOSCOPY     MOUTH SURGERY     wisdom teeth   Social History   Socioeconomic History   Marital status: Single    Spouse name: Not on file   Number of children: 0   Years of education: Not on file   Highest education level: Not on file  Occupational History    Employer: [employer]  Tobacco Use   Smoking status: Never   Smokeless tobacco: Never  Vaping Use   Vaping status: Never Used  Substance and Sexual Activity   Alcohol use: No   Drug use: No   Sexual activity: Not on file  Other Topics Concern   Not on file  Social History Narrative   He is single, no children, 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   No alcohol tobacco or drug use   Social Drivers of Corporate investment banker Strain: Not on file  Food Insecurity: Not on file  Transportation Needs: Not on file  Physical Activity: Not on file  Stress: Not on file  Social Connections: Not on file   Family History  Problem  Relation Age of Onset   Colon cancer Father 24   Rectal cancer Father    Hypertension Father    Stroke Mother    Colon cancer Maternal Grandfather 58   Rectal cancer Paternal Grandfather    Heart attack Paternal Aunt    Pancreatic cancer Paternal Uncle    No Known Allergies Current Outpatient Medications  Medication Sig Dispense Refill   diltiazem  (CARDIZEM ) 30 MG tablet TAKE 1 TABLET BY MOUTH EVERY 4 HOURS AS NEEDED FOR AFIB HEART RATE > 100, AS LONG AS BLOOD PRESSURE IS >100 90 tablet 2   fluticasone  (FLONASE ) 50 MCG/ACT nasal spray Place 2 sprays into both nostrils 2 (two) times daily. 16 g 6   levocetirizine (XYZAL  ALLERGY 24HR) 5 MG tablet Take 1 tablet (5 mg total) by mouth every evening. 30 tablet 3   metoprolol  tartrate (LOPRESSOR ) 25 MG tablet Take 1/2 (one-half) tablet by mouth twice daily. 90 tablet 3   rosuvastatin  (CRESTOR ) 5 MG tablet Take 1 tablet (5 mg total) by mouth at bedtime as needed. 90 tablet 3   No current facility-administered medications for this visit.   No results found.  Review  of Systems:   A ROS was performed including pertinent positives and negatives as documented in the HPI.  Physical Exam :   Constitutional: NAD and appears stated age Neurological: Alert and oriented Psych: Appropriate affect and cooperative There were no vitals taken for this visit.   Comprehensive Musculoskeletal Exam:    Patient is utilizing normal shoes and ambulating with minimal antalgic gait without use of assistive device.  Mild tenderness over the base of the third toe.  No visible deformity.  Imaging:   Xray (left foot 3 views): Minimally displaced intra-articular third proximal phalanx fracture without any significant progression of callus or bone formation   I personally reviewed and interpreted the radiographs.   Assessment:   53 y.o. male now 3-1/54-month status post fracture to the third proximal phalanx of the left foot.  This does have some  intra-articular involvement and despite immobilization in a postop shoe, there is unfortunately still no significant progression of callus or healing.  At this point we discussed the need for further intervention, and I will plan to send him to Dr. Harden for further evaluation.  Discussed this with Dr. Burnetta who states that he would be more than willing to do see patient for discussion of shockwave therapy versus a bone stimulator to encourage healing, but would like to first set up evaluation with Dr. Harden to ensure that this would not need to be treated operatively.   Plan :    - Refer to Dr. Harden for discussion of further management with possible follow-up with Dr. Burnetta if nonsurgical     I personally saw and evaluated the patient, and participated in the management and treatment plan.  Leonce Reveal, PA-C Orthopedics

## 2024-05-12 ENCOUNTER — Encounter (HOSPITAL_BASED_OUTPATIENT_CLINIC_OR_DEPARTMENT_OTHER): Payer: Self-pay

## 2024-05-12 ENCOUNTER — Encounter: Payer: Self-pay | Admitting: Orthopedic Surgery

## 2024-05-12 ENCOUNTER — Telehealth (HOSPITAL_BASED_OUTPATIENT_CLINIC_OR_DEPARTMENT_OTHER): Payer: Self-pay | Admitting: Student

## 2024-05-12 ENCOUNTER — Ambulatory Visit: Admitting: Orthopedic Surgery

## 2024-05-12 DIAGNOSIS — S92512A Displaced fracture of proximal phalanx of left lesser toe(s), initial encounter for closed fracture: Secondary | ICD-10-CM | POA: Diagnosis not present

## 2024-05-12 NOTE — Progress Notes (Signed)
 Office Visit Note   Patient: Charles Rasmussen           Date of Birth: 11-18-1970           MRN: [id number] Visit Date: 05/12/2024              Requested by: Charles Leonce CROME, PA-C [address] [place],  [place] [address] PCP: Dettinger, Fonda LABOR, MD  Chief Complaint  Patient presents with   Left Foot - Pain      HPI: Discussed the use of AI scribe software for clinical note transcription with the patient, who gave verbal consent to proceed.  History of Present Illness Charles Rasmussen is a 53 year old male who presents with a healing interarticular fracture of the third toe.  He has a fracture through the base of the proximal phalanx of the third toe, identified on radiographs from October 23rd. The fracture is interarticular but non-displaced, and the joint appears congruent on oblique radiographs.  He has been cautious with his activities to avoid putting pressure on the foot. He attempted to use a carbon insert, but it only fit in one pair of shoes, so he has been careful to avoid weight-bearing on the affected foot.  He has not been taking vitamin D3 or K2 supplements, which were suggested to aid in bone healing.  He is concerned about the healing process but is reassured by the progress.     Assessment & Plan: Visit Diagnoses:  1. Closed displaced fracture of proximal phalanx of lesser toe of left foot, initial encounter     Plan: Assessment and Plan Assessment & Plan Healing nondisplaced intra-articular fracture of proximal phalanx, right third toe Nondisplaced intra-articular fracture at the base of the proximal phalanx, right third toe. Radiographs show congruent joint, no displacement. Healing well, complete healing expected in one month. - Recommend wearing stiffer sneakers to limit foot movement. - Advise taking 1000 IU of vitamin D3 daily to aid bone healing. - Advise taking vitamin K2 daily to enhance vitamin D  absorption. - Discontinue wrapping  the toe to encourage movement. - Encourage stretching exercises for the heel and Achilles tendon. - Advise increasing activities as tolerated. - Instruct to call if there are any changes or problems.      Follow-Up Instructions: Return if symptoms worsen or fail to improve.   Ortho Exam  Patient is alert, oriented, no adenopathy, well-dressed, normal affect, normal respiratory effort. Physical Exam CARDIOVASCULAR: Dorsalis pedis pulse present. MUSCULOSKELETAL: No pain to palpation across metatarsal heads or over third toe. No swelling in foot. SKIN: No redness or cellulitis in foot.      Imaging: No results found. No images are attached to the encounter.  Labs: Lab Results  Component Value Date   LABORGA NO GROWTH 09/27/2012     Lab Results  Component Value Date   ALBUMIN 4.2 01/22/2024   ALBUMIN 4.3 07/01/2023   ALBUMIN 4.3 07/24/2022    Lab Results  Component Value Date   MG 2.0 09/03/2016   Lab Results  Component Value Date   VD25OH 27.3 (L) 01/18/2019   VD25OH 23.4 (L) 03/05/2017    No results found for: PREALBUMIN    Latest Ref Rng & Units 01/22/2024   11:42 AM 01/06/2024    3:48 PM 07/01/2023    8:37 AM  CBC EXTENDED  WBC 3.4 - 10.8 x10E3/uL 5.1  4.8  5.1   RBC 4.14 - 5.80 x10E6/uL 4.74  4.42  4.72  Hemoglobin 13.0 - 17.7 g/dL 85.9  86.4  85.6   HCT 37.5 - 51.0 % 43.4  40.5  43.0   Platelets 150 - 450 x10E3/uL 320  231  282   NEUT# 1.4 - 7.0 x10E3/uL 3.0  3.1  3.0   Lymph# 0.7 - 3.1 x10E3/uL 1.5  1.0  1.4      There is no height or weight on file to calculate BMI.  Orders:  No orders of the defined types were placed in this encounter.  No orders of the defined types were placed in this encounter.    Procedures: No procedures performed  Clinical Data: No additional findings.  ROS:  All other systems negative, except as noted in the HPI. Review of Systems  Objective: Vital Signs: There were no vitals taken for this  visit.  Specialty Comments:  No specialty comments available.  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 06/24/2023   Family history of colon cancer 04/19/2020   A-fib (HCC) 10/08/2019   Hypertensive disorder 08/26/2019   PVC (premature ventricular contraction) 02/19/2014   Pectus excavatum 11/07/2013   Chronic prostatitis 10/28/2012   Hyperlipidemia 10/28/2012   MVP (mitral valve prolapse) 10/28/2012   Varicose veins of bilateral lower extremities with other complications 12/30/2011   Past Medical History:  Diagnosis Date   Anxiety    Atrial fibrillation (HCC)    Hemorrhoid thrombosis    Mitral valve prolapse    Unspecified hemorrhoids without mention of complication 2006   Venous reflux 01/02/12   deep right per Charles Rasmussen, M.D.    Family History  Problem Relation Age of Onset   Colon cancer Father 84   Rectal cancer Father    Hypertension Father    Stroke Mother    Colon cancer Maternal Grandfather 51   Rectal cancer Paternal Grandfather    Heart attack Paternal Aunt    Pancreatic cancer Paternal Uncle     Past Surgical History:  Procedure Laterality Date   COLONOSCOPY     MOUTH SURGERY     wisdom teeth   Social History   Occupational History    Employer: [employer]  Tobacco Use   Smoking status: Never   Smokeless tobacco: Never  Vaping Use   Vaping status: Never Used  Substance and Sexual Activity   Alcohol use: No   Drug use: No   Sexual activity: Not on file

## 2024-05-12 NOTE — Telephone Encounter (Signed)
 I tried calling pt back message came on stating my call can not be completed at this time and try again later.

## 2024-05-12 NOTE — Telephone Encounter (Signed)
 Patient states that Dr Harden told him something total different than what Leonce told him. Please contact the message [phone]

## 2024-05-13 ENCOUNTER — Other Ambulatory Visit: Payer: Self-pay | Admitting: *Deleted

## 2024-05-13 DIAGNOSIS — S92512A Displaced fracture of proximal phalanx of left lesser toe(s), initial encounter for closed fracture: Secondary | ICD-10-CM

## 2024-05-13 NOTE — Telephone Encounter (Signed)
 Referral placed to [facility]

## 2024-05-16 ENCOUNTER — Encounter: Payer: Self-pay | Admitting: Radiology

## 2024-05-24 ENCOUNTER — Ambulatory Visit: Admitting: Sports Medicine

## 2024-05-24 ENCOUNTER — Encounter: Payer: Self-pay | Admitting: Sports Medicine

## 2024-05-24 DIAGNOSIS — S92512G Displaced fracture of proximal phalanx of left lesser toe(s), subsequent encounter for fracture with delayed healing: Secondary | ICD-10-CM | POA: Diagnosis not present

## 2024-05-24 NOTE — Progress Notes (Signed)
 Patient says that his foot is occasionally still painful. He says that there is no pattern with when it is bothersome, but when it is it feels as though he is stepping on something. He is not doing anything to treat it aside from supplementing with vitamins. He is here today to discuss shockwave therapy.

## 2024-05-24 NOTE — Progress Notes (Signed)
 Breaker Charles Rasmussen - 53 y.o. male MRN [id number]  Date of birth: 1970-10-22  Office Visit Note: Visit Date: 05/24/2024 PCP: Charles Rasmussen LABOR, MD Referred by: Charles Charles CROME, PA*  Subjective: Chief Complaint  Patient presents with   Left Foot - Pain   HPI: Charles Rasmussen is a very pleasant 53 y.o. male who presents today for evaluation of left foot third proximal phalanx minimally displaced fracture.  DOI: 01/20/2024 -hit his foot into a coffee table, developed pain and swelling in the toe.  He has been seeing Charles Rasmussen for this.  Really after the first 3 weeks or so he has not had much pain.  Very occasionally he will feel a mild twinge or feel like he is stepping on something and in general not having pain.  He has been hobbling differently as to not put pressure over the toe.  He did see Charles Rasmussen who was reassured that there is no surgical intervention for this and did have early signs of healing.  He recommended vitamin D  with K2 which the patient has been using.  He was recommended to use a carbon fiber insole, he has this but was unable to fit into the shoe.  Charles Rasmussen did discuss possible shockwave therapy versus bone stimulator given his early signs but not significant signs of healing, he presents to discuss this further today.  Pertinent ROS were reviewed with the patient and found to be negative unless otherwise specified above in HPI.   Assessment & Plan: Visit Diagnoses:  1. Closed displaced fracture of proximal phalanx of lesser toe of left foot with delayed healing, subsequent encounter    Plan: Impression is minimally displaced intra-articular fracture of the third proximal phalanx.  He is about 4 months out from his initial injury.  His pain is essentially none at this point, does have acute episodes of intermittent pain that do not last.  Saw Charles Rasmussen who advised against surgical intervention and did have early signs of healing.  He will continue his 1000  international units of vitamin D  with K2 for absorption. I did recommend he shave down his carbon fiber insole to help fit into shoes.  Did discuss he can discontinue buddy taping, he may start walking normally or just avoid hard impact activity at this time.  I did discuss with Charles Rasmussen he does have some early periosteal reaction showing this is healing, and does have clinical signs of healing given he has essentially no pain but he does not yet have abundant callus formation or bony bridging.  At this point, I do think he is healing but still in earlier stages, I would like to see him back in 6 weeks to repeat x-ray and ensure he starts forming callus formation.  If he does, we will let him continue.  If he does not have much bony change at that point, we did discuss considering a few treatments of extracorporeal shockwave therapy to aid in bony augmentation and healing.  He is agreeable to this plan.  Follow-up in 6 weeks.  Follow-up: Return in about 6 weeks (around 07/05/2024) for L-foot f/u (repeat foot XR's).   Meds & Orders: No orders of the defined types were placed in this encounter.  No orders of the defined types were placed in this encounter.    Procedures: No procedures performed      Clinical History: No specialty comments available.  He reports that he has never smoked. He has never used smokeless  tobacco. No results for input(s): HGBA1C, LABURIC in the last 8760 hours.  Objective:    Physical Exam  Gen: Well-appearing, in no acute distress; non-toxic CV: Well-perfused. Warm.  Resp: Breathing unlabored on room air; no wheezing. Psych: Fluid speech in conversation; appropriate affect; normal thought process  Ortho Exam - Left foot: There is no redness swelling or effusion of the foot or toes.  There is minimal tenderness at the proximal phalanx base of the third toe.  Cap refill less than 2 seconds.  There is buddy taping in place.  Imaging:  *Did review initial imaging as  well as repeat imaging as below.  Narrative & Impression  CLINICAL DATA:  Closed displaced fracture of proximal phalanx of lesser toe of left foot. Fracture follow-up.   EXAM: DG FOOT COMPLETE 3+V*L*   COMPARISON:  Radiograph 03/17/2024   FINDINGS: Fracture of the third proximal phalanx is in unchanged alignment, minimally displaced. Extension to the metatarsal phalangeal joint is again seen. Minimal periosteal reaction but no significant peripheral callus formation or bony bridging. No additional or new fracture. No focal soft tissue abnormalities.   IMPRESSION: Unchanged alignment of third proximal phalanx fracture. Minimal periosteal reaction but no significant peripheral callus formation or bony bridging.     Electronically Signed   By: Charles Rasmussen M.D.   On: 05/07/2024 09:28   Narrative & Impression  CLINICAL DATA:  Left foot pain   EXAM: LEFT FOOT - COMPLETE 3+ VIEW   COMPARISON:  01/21/2024   FINDINGS: There is an acute oblique minimally displaced fracture of the left third toe proximal phalanx at the MTP joint. Overall stable appearance.   No associated subluxation or dislocation. No other acute osseous finding or joint abnormality. No significant arthropathy. Soft tissues unremarkable.   IMPRESSION: Acute minimally displaced left third toe proximal phalanx fracture.     Electronically Signed   By: Charles.  Rasmussen M.D.   On: 02/05/2024 14:51    Past Medical/Family/Surgical/Social History: Medications & Allergies reviewed per EMR, new medications updated. 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 06/24/2023   Family history of colon cancer 04/19/2020   A-fib (HCC) 10/08/2019   Hypertensive disorder 08/26/2019   PVC (premature ventricular contraction) 02/19/2014   Pectus excavatum 11/07/2013   Chronic prostatitis 10/28/2012   Hyperlipidemia 10/28/2012   MVP (mitral valve prolapse)  10/28/2012   Varicose veins of bilateral lower extremities with other complications 12/30/2011   Past Medical History:  Diagnosis Date   Anxiety    Atrial fibrillation (HCC)    Hemorrhoid thrombosis    Mitral valve prolapse    Unspecified hemorrhoids without mention of complication 2006   Venous reflux 01/02/12   deep right per Lynwood D. Gerlean, M.D.   Family History  Problem Relation Age of Onset   Colon cancer Father 61   Rectal cancer Father    Hypertension Father    Stroke Mother    Colon cancer Maternal Grandfather 62   Rectal cancer Paternal Grandfather    Heart attack Paternal Aunt    Pancreatic cancer Paternal Uncle    Past Surgical History:  Procedure Laterality Date   COLONOSCOPY     MOUTH SURGERY     wisdom teeth   Social History   Occupational History    Employer: [employer]  Tobacco Use   Smoking status: Never   Smokeless tobacco: Never  Vaping Use   Vaping status: Never Used  Substance and Sexual Activity   Alcohol use: No   Drug use: No   Sexual activity: Not on file

## 2024-05-25 ENCOUNTER — Encounter (HOSPITAL_BASED_OUTPATIENT_CLINIC_OR_DEPARTMENT_OTHER): Payer: Self-pay

## 2024-06-08 ENCOUNTER — Telehealth: Payer: Self-pay | Admitting: Physician Assistant

## 2024-06-08 MED ORDER — DILTIAZEM HCL 30 MG PO TABS: ORAL_TABLET | ORAL | 1 refills | Status: DC

## 2024-06-08 NOTE — Telephone Encounter (Signed)
 Pt did advise that Dr. Fernande had told him that he would not be here when he came back in 2026 and he wanted him to see the Dr that was coming from [place].  Pt stated the scheduler told him he will see Dr. Floretta.  Pt wanted to ask Aldona, Dr. Celine RN, which EP dr is from [facility] that Dr. Fernande was talking about, because that is who he wants to see.

## 2024-06-08 NOTE — Telephone Encounter (Signed)
 Refill sent.

## 2024-06-08 NOTE — Telephone Encounter (Signed)
*  STAT* If patient is at the pharmacy, call can be transferred to refill team.   1. Which medications need to be refilled? (please list name of each medication and dose if known)  diltiazem  (CARDIZEM ) 30 MG tablet    2. Which pharmacy/location (including street and city if local pharmacy) is medication to be sent to? [facility] [address] [place] - [address] Phone: [phone]  Fax: [phone]      3. Do they need a 30 day or 90 day supply?  90 Pt is out of medication

## 2024-06-13 ENCOUNTER — Ambulatory Visit: Admitting: Orthopedic Surgery

## 2024-06-28 NOTE — Telephone Encounter (Signed)
 Spoke w/ patient - he is scheduled to see Dr. Almetta on 1/16 and his appointment with Dr. Floretta has been cancelled since it was with the wrong provider/specialty.

## 2024-07-05 ENCOUNTER — Ambulatory Visit: Admitting: Sports Medicine

## 2024-07-13 ENCOUNTER — Encounter: Payer: Self-pay | Admitting: Sports Medicine

## 2024-07-13 ENCOUNTER — Ambulatory Visit: Admitting: Sports Medicine

## 2024-07-13 ENCOUNTER — Other Ambulatory Visit: Payer: Self-pay

## 2024-07-13 DIAGNOSIS — S92512D Displaced fracture of proximal phalanx of left lesser toe(s), subsequent encounter for fracture with routine healing: Secondary | ICD-10-CM

## 2024-07-13 DIAGNOSIS — M5136 Other intervertebral disc degeneration, lumbar region with discogenic back pain only: Secondary | ICD-10-CM | POA: Diagnosis not present

## 2024-07-13 DIAGNOSIS — G8929 Other chronic pain: Secondary | ICD-10-CM

## 2024-07-13 DIAGNOSIS — M545 Low back pain, unspecified: Secondary | ICD-10-CM

## 2024-07-13 NOTE — Progress Notes (Signed)
 "  Charles Rasmussen - 53 y.o. male MRN [id number]  Date of birth: 1970/12/07  Office Visit Note: Visit Date: 07/13/2024 PCP: Dettinger, Fonda LABOR, MD Referred by: Dettinger, Fonda LABOR, MD  Subjective: Chief Complaint  Patient presents with   Left Foot - Follow-up   HPI: Charles Rasmussen is a pleasant 53 y.o. male who presents today for f/u of left foot third proximal phalanx minimally displaced fracture.   DOI: 01/20/2024 -hit his foot into a coffee table, developed pain and swelling in the toe.  Overall this continues doing very well.  He has not had any soreness in the foot but over the last month, has been walking back to normal, had to re-learn this.  He did move across his yard quickly over the weekend and felt some soreness in the Achilles.  Occasionally feels broken glass under the ball of the foot that comes and goes, primarily when weightbearing.  Also has had a recent episode of throwing out his back.  He has done this several times over the last few years.  More recently is taking longer to recover.  He has to go to the chiropractor to get this moving again.  He has had x-rays at his chiropractor which he which showed loss of disc height at the L5-S1 level.  Pertinent ROS were reviewed with the patient and found to be negative unless otherwise specified above in HPI.   Assessment & Plan: Visit Diagnoses:  1. Closed displaced fracture of proximal phalanx of lesser toe of left foot with routine healing, subsequent encounter   2. Chronic bilateral low back pain without sciatica   3. Degeneration of intervertebral disc of lumbar region with discogenic back pain    Plan: Impression is significantly improved late healing of the proximal phalanx of the third toe of the left foot, x-rays show significant osseous healing since his last visit.  At this point, do not feel we need a bone stimulator or shockwave therapy to augment osseous healing and would expect this to be 100% healed  radiographically over the next few months.  For this and his degree of metatarsalgia with loss of transverse arch, I did place him in green sports insoles which he found comfortable today.  We could consider metatarsal padding if he is still having some issues over the next 6 weeks but will hold on this for now.  He also would like to address his chronic low back which has been thrown out several times.  Does need to work on his sensors as well as his core stability.  Given this underlies physical therapy.  He will take pictures of his previous x-rays from chiropractor and will okay for over-the-counter anti-inflammatories only as needed.  Follow-up: Return in about 7 weeks (around 08/31/2024) for Foot and back f/u .   Meds & Orders: No orders of the defined types were placed in this encounter.   Orders Placed This Encounter  Procedures   XR Foot Complete Left     Procedures: No procedures performed      Clinical History: No specialty comments available.  He reports that he has never smoked. He has never used smokeless tobacco. No results for input(s): HGBA1C, LABURIC in the last 8760 hours.  Objective:    Physical Exam  Gen: Well-appearing, in no acute distress; non-toxic CV: Well-perfused. Warm.  Resp: Breathing unlabored on room air; no wheezing. Psych: Fluid speech in conversation; appropriate affect; normal thought process  Ortho Exam - Left foot:  Good alignment of the toes.  No redness swelling or effusion.  There is loss of transverse arch bilaterally.  Mild tenderness over the metatarsal ridge of the left foot.   - Back: No midline spinous process TTP.  There is bilateral paraspinal hypertonicity in the lower lumbar segment.  There is full range of motion with flexion and extension although mild discomfort with coming up in the flexed position.  There is myofascial restriction on the left greater than right side.  Imaging:  N/A  Past Medical/Family/Surgical/Social  History: Medications & Allergies reviewed per EMR, new medications updated. 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 06/24/2023   Family history of colon cancer 04/19/2020   A-fib (HCC) 10/08/2019   Hypertensive disorder 08/26/2019   PVC (premature ventricular contraction) 02/19/2014   Pectus excavatum 11/07/2013   Chronic prostatitis 10/28/2012   Hyperlipidemia 10/28/2012   MVP (mitral valve prolapse) 10/28/2012   Varicose veins of bilateral lower extremities with other complications 12/30/2011   Past Medical History:  Diagnosis Date   Anxiety    Atrial fibrillation (HCC)    Hemorrhoid thrombosis    Mitral valve prolapse    Unspecified hemorrhoids without mention of complication 2006   Venous reflux 01/02/12   deep right per Lynwood D. Gerlean, M.D.   Family History  Problem Relation Age of Onset   Colon cancer Father 26   Rectal cancer Father    Hypertension Father    Stroke Mother    Colon cancer Maternal Grandfather 22   Rectal cancer Paternal Grandfather    Heart attack Paternal Aunt    Pancreatic cancer Paternal Uncle    Past Surgical History:  Procedure Laterality Date   COLONOSCOPY     MOUTH SURGERY     wisdom teeth   Social History   Occupational History    Employer: [employer]  Tobacco Use   Smoking status: Never   Smokeless tobacco: Never  Vaping Use   Vaping status: Never Used  Substance and Sexual Activity   Alcohol use: No   Drug use: No   Sexual activity: Not on file   I spent 34 minutes in the care of the patient today including face-to-face time, preparation to see the patient, as well as foot and gait analysis, discussion on orthotic management, new evaluation of low back pain, review of chiropractic care and previous radiographic findings discussion on PT --> HEP modification for the above diagnoses.   Lonell Sprang, DO Primary Care Sports Medicine Physician  [facility]  This note was dictated using Dragon naturally speaking software and may contain errors in syntax, spelling, or content which have not been identified prior to signing this note.    "

## 2024-07-13 NOTE — Progress Notes (Signed)
 Patient says he is feeling better since his last visit. He says that he did not have any soreness in the foot over the last month, and has been trying to walk normally as advised. He did move quickly across his yard this weekend and says that his left achilles felt sore the next day. He also describes a feeling of broken glass under the ball of the foot that comes and goes, primarily when weightbearing.

## 2024-07-19 ENCOUNTER — Encounter: Payer: Self-pay | Admitting: Sports Medicine

## 2024-07-29 ENCOUNTER — Encounter: Payer: Self-pay | Admitting: Student in an Organized Health Care Education/Training Program

## 2024-07-29 ENCOUNTER — Ambulatory Visit: Admitting: Student in an Organized Health Care Education/Training Program

## 2024-07-29 VITALS — BP 110/70 | HR 81 | Ht 76.0 in | Wt 201.0 lb

## 2024-07-29 DIAGNOSIS — I493 Ventricular premature depolarization: Secondary | ICD-10-CM

## 2024-07-29 DIAGNOSIS — I480 Paroxysmal atrial fibrillation: Secondary | ICD-10-CM

## 2024-07-29 MED ORDER — BISOPROLOL FUMARATE 5 MG PO TABS: 2.5000 mg | ORAL_TABLET | Freq: Every day | ORAL | 0 refills | Status: AC

## 2024-07-29 NOTE — Progress Notes (Unsigned)
 " Cardiology Office Note   Date: 07/29/24 ID:  AVIRAJ KENTNER, DOB 1971-03-04, MRN [id number] PCP: Dettinger, Fonda LABOR, MD  [facility] Providers Cardiologist:  None Electrophysiologist:  Donnice LABOR Primus, MD   History of Present Illness Pharell KAREM TOMASO is a 54 y.o. male with persistent AF and PVCs who presents for arrhythmia management.   Discussed the use of AI scribe software for clinical note transcription with the patient, who gave verbal consent to proceed. History of Present Illness Sina JARED WHORLEY is a 54 year old male with atrial fibrillation and premature ventricular contractions who presents for arrhythmia management.   He lost his voice following an illness approximately one to one and a half weeks ago and has been unable to regain it. He apologizes for having to speak loudly during the visit.  He has a history of atrial fibrillation and premature ventricular contractions since high school. He was diagnosed with atrial fibrillation about seven years ago, which he describes as a serious development. Medication has helped control his condition, and he has not had a significant episode in about seven years. He uses a technique taught by the atrial fibrillation clinic to manage episodes by 'pressing down like doing a bowel movement.'  He first noticed significant lightheadedness a couple of years ago while caring for his father, who had dementia. During this stressful period, he experienced frequent premature ventricular contractions and felt unable to catch his breath, which felt different from his usual symptoms. He mentioned this to his previous doctor, who did not express concern at the time. Recently, he has experienced similar symptoms, including lightheadedness and dizziness, particularly when standing in line at a grocery store. He describes the sensation as feeling 'a little bit dizzy' and notes that it feels different from a regular skipped beat. No episodes of passing  out.  He currently takes metoprolol , which he cuts in half and shaves a third or fourth off due to side effects that make him feel 'a little drunk all the time.' He also takes diltiazem  as needed during atrial fibrillation episodes, but has found that taking a quarter of a diltiazem  tablet every four hours, except when asleep, helps manage his symptoms when he is active.  He mentions that he has low blood pressure and experiences dizziness when straining during bowel movements. He has not had a heart monitor in several years but notes that he feels all of his skipped beats.  ROS: none   Studies Reviewed  ECG review 07/29/24: NSR 81, PR 164, QRS 80, QT/c 352/408, isolated PVC (rS V1, V3 transition, superior axis)  08/11/23: NSR 69, PR 168, QRS 88, QT/c 368/394 05/25/19: NSR 80, PR 168, QRS 92, QT/c 348/401 08/05/17: NSR 88, PR 166, QRS 80, QT/c 344/416, isolated PVC  TTE  Result date: 09/02/16 - Left ventricle: The cavity size was normal. There was mild    concentric hypertrophy. Systolic function was normal. The    estimated ejection fraction was in the range of 55% to 60%. Wall    motion was normal; there were no regional wall motion    abnormalities. Left ventricular diastolic function parameters    were normal.  - Aortic valve: Transvalvular velocity was within the normal range.    There was no stenosis. There was no regurgitation.  - Mitral valve: Thickening and elongation of the anterior leaflet.    Transvalvular velocity was within the normal range. There was no    evidence for stenosis. There was mild  regurgitation.  - Right ventricle: The cavity size was normal. Wall thickness was    normal. Systolic function was normal.  - Tricuspid valve: There was trivial regurgitation.  - Pulmonary arteries: Systolic pressure was within the normal    range.   CHA2DS2-VASc Score = 0  This indicates a 0.2% annual risk of stroke. The patient's score is based upon: CHF History: 0 HTN  History: 0 Diabetes History: 0 Stroke History: 0 Vascular Disease History: 0 Age Score: 0 Gender Score: 0  Physical Exam VS:  BP 110/70 (BP Location: Left Arm, Patient Position: Sitting, Cuff Size: Normal)   Pulse 81   Ht 6' 4 (1.93 m)   Wt 201 lb (91.2 kg)   SpO2 98%   BMI 24.47 kg/m   Wt Readings from Last 3 Encounters:  07/29/24 201 lb (91.2 kg)  01/22/24 193 lb (87.5 kg)  01/06/24 197 lb (89.4 kg)    GEN: Well nourished, well developed in no acute distress CARDIAC: RRR, no murmurs, rubs, gallops RESPIRATORY:  Clear to auscultation without rales, wheezing or rhonchi  EXTREMITIES:  No edema; No deformity   ASSESSMENT AND PLAN Kaushik ZACHARIUS FUNARI is a 54 y.o. male with persistent AF and PVCs who presents for arrhythmia management.  Assessment & Plan Paroxysmal atrial fibrillation Well-controlled with no significant episodes in seven years. Current management includes metoprolol  and diltiazem . Bisoprolol  considered to simplify regimen and reduce side effects. He has been cutting up dilt IR 30 mg tablets into quarters and taking every 4 hours. Also taking lopressor  12.5 mg PO bid. Asked him to just stop both of these and start bisoprolol  2.5 mg PO daily. Will see if he can tolerate this. The dose of diltiazem  he is taking is so small I wouldn't think it is actually providing any effect on his PVCs.  - Initiated bisoprolol  2.5 mg once daily. - Instructed to hold metoprolol  and diltiazem  while starting bisoprolol . - Instructed to monitor for side effects and efficacy of bisoprolol . - If bisoprolol  is not effective, will consider increasing dose to one tablet daily (5 mg daily). Could consider low dose flecainde if sx on minimal nodal agents although I suspect he may have side effects with this as well   Premature ventricular contractions PVCs present since high school, with recent increase in frequency and dizziness. Current management includes metoprolol  and diltiazem . Bisoprolol   considered to simplify regimen and reduce side effects. No immediate need for a heart monitor. - Initiated bisoprolol  2.5 mg once daily. - Instructed to hold metoprolol  and diltiazem  while starting bisoprolol . - Instructed to monitor for side effects and efficacy of bisoprolol . - If bisoprolol  is not effective, will consider increasing dose to one tablet daily.   Dispo: RTC 1 yr  A total of 30 minutes was spent preparing for the patient, reviewing history, performing exam, document encounter, coordinating care and counseling the patient. 20 minutes was spent with direct patient care.   Signed, Donnice DELENA Primus, MD  "

## 2024-07-29 NOTE — Progress Notes (Unsigned)
" °  Cardiology Office Note   Date:  07/29/2024  ID:  MALLIE LINNEMANN, DOB 1971/04/29, MRN [id number] PCP: Dettinger, Fonda LABOR, MD  [facility] Providers Cardiologist:  None { Click to update primary MD,subspecialty MD or APP then REFRESH:1}    History of Present Illness Shamarcus BALLARD BUDNEY is a 54 y.o. male ***  ROS: ***  Studies Reviewed      *** Risk Assessment/Calculations {Does this patient have ATRIAL FIBRILLATION?:[phone]}         Physical Exam VS:  BP 110/70 (BP Location: Left Arm, Patient Position: Sitting, Cuff Size: Normal)   Pulse 81   Ht 6' 4 (1.93 m)   Wt 201 lb (91.2 kg)   SpO2 98%   BMI 24.47 kg/m        Wt Readings from Last 3 Encounters:  07/29/24 201 lb (91.2 kg)  01/22/24 193 lb (87.5 kg)  01/06/24 197 lb (89.4 kg)    GEN: Well nourished, well developed in no acute distress NECK: No JVD; No carotid bruits CARDIAC: ***RRR, no murmurs, rubs, gallops RESPIRATORY:  Clear to auscultation without rales, wheezing or rhonchi  ABDOMEN: Soft, non-tender, non-distended EXTREMITIES:  No edema; No deformity   ASSESSMENT AND PLAN ***    {Are you ordering a CV Procedure (e.g. stress test, cath, DCCV, TEE, etc)?   Press F2        :[id number]}  Dispo: ***  Signed, Donnice LABOR Primus, MD  "

## 2024-07-29 NOTE — Patient Instructions (Signed)
 Medication Instructions:  Your physician has recommended you make the following change in your medication:   ** Stop Metoprolol   ** Stop Diltiazem   Bisoprolol  5mg  - 1/2 tablet by mouth daily  *If you need a refill on your cardiac medications before your next appointment, please call your pharmacy*  Lab Work: None ordered.  If you have labs (blood work) drawn today and your tests are completely normal, you will receive your results only by: MyChart Message (if you have MyChart) OR A paper copy in the mail If you have any lab test that is abnormal or we need to change your treatment, we will call you to review the results.  Testing/Procedures: None ordered.   Follow-Up: At [facility], you and your health needs are our priority.  As part of our continuing mission to provide you with exceptional heart care, our providers are all part of one team.  This team includes your primary Cardiologist (physician) and Advanced Practice Providers or APPs (Physician Assistants and Nurse Practitioners) who all work together to provide you with the care you need, when you need it.  Your next appointment:   6 months with Dr Almetta

## 2024-08-15 ENCOUNTER — Other Ambulatory Visit: Payer: Self-pay | Admitting: Family Medicine

## 2024-08-15 DIAGNOSIS — E782 Mixed hyperlipidemia: Secondary | ICD-10-CM

## 2024-08-22 ENCOUNTER — Ambulatory Visit: Admitting: Student in an Organized Health Care Education/Training Program

## 2024-08-31 ENCOUNTER — Ambulatory Visit: Admitting: Sports Medicine
